# Patient Record
Sex: Male | Born: 1960 | Hispanic: No | Marital: Married | Smoking: Current every day smoker
Health system: Southern US, Community
[De-identification: ages and names within clinical notes are randomized; demographics above are authoritative.]

## PROBLEM LIST (undated history)

## (undated) DIAGNOSIS — M545 Low back pain, unspecified: Secondary | ICD-10-CM

## (undated) DIAGNOSIS — M549 Dorsalgia, unspecified: Secondary | ICD-10-CM

## (undated) DIAGNOSIS — M5136 Other intervertebral disc degeneration, lumbar region: Secondary | ICD-10-CM

## (undated) DIAGNOSIS — E785 Hyperlipidemia, unspecified: Secondary | ICD-10-CM

## (undated) DIAGNOSIS — M51369 Other intervertebral disc degeneration, lumbar region without mention of lumbar back pain or lower extremity pain: Secondary | ICD-10-CM

## (undated) DIAGNOSIS — M542 Cervicalgia: Secondary | ICD-10-CM

## (undated) DIAGNOSIS — G8929 Other chronic pain: Secondary | ICD-10-CM

## (undated) DIAGNOSIS — A6000 Herpesviral infection of urogenital system, unspecified: Secondary | ICD-10-CM

## (undated) HISTORY — PX: SHOULDER SURGERY: SHX246

## (undated) HISTORY — DX: Hyperlipidemia, unspecified: E78.5

---

## 2003-08-01 ENCOUNTER — Emergency Department (HOSPITAL_COMMUNITY): Admission: EM | Admit: 2003-08-01 | Discharge: 2003-08-02 | Payer: Self-pay

## 2003-08-03 ENCOUNTER — Emergency Department (HOSPITAL_COMMUNITY): Admission: EM | Admit: 2003-08-03 | Discharge: 2003-08-03 | Payer: Self-pay | Admitting: Emergency Medicine

## 2003-08-21 ENCOUNTER — Emergency Department (HOSPITAL_COMMUNITY): Admission: EM | Admit: 2003-08-21 | Discharge: 2003-08-21 | Payer: Self-pay | Admitting: Emergency Medicine

## 2004-01-31 ENCOUNTER — Ambulatory Visit (HOSPITAL_COMMUNITY): Admission: RE | Admit: 2004-01-31 | Discharge: 2004-02-01 | Payer: Self-pay | Admitting: Orthopedic Surgery

## 2004-03-12 ENCOUNTER — Encounter: Admission: RE | Admit: 2004-03-12 | Discharge: 2004-05-19 | Payer: Self-pay | Admitting: Orthopedic Surgery

## 2004-05-13 ENCOUNTER — Ambulatory Visit: Payer: Self-pay | Admitting: Family Medicine

## 2004-05-26 ENCOUNTER — Ambulatory Visit (HOSPITAL_COMMUNITY): Admission: RE | Admit: 2004-05-26 | Discharge: 2004-05-26 | Payer: Self-pay | Admitting: Orthopedic Surgery

## 2004-06-10 ENCOUNTER — Encounter: Admission: RE | Admit: 2004-06-10 | Discharge: 2004-06-26 | Payer: Self-pay | Admitting: Orthopedic Surgery

## 2004-06-24 ENCOUNTER — Ambulatory Visit: Payer: Self-pay | Admitting: Family Medicine

## 2004-08-12 ENCOUNTER — Ambulatory Visit: Payer: Self-pay | Admitting: Family Medicine

## 2004-08-21 ENCOUNTER — Encounter (INDEPENDENT_AMBULATORY_CARE_PROVIDER_SITE_OTHER): Payer: Self-pay | Admitting: Family Medicine

## 2004-08-21 LAB — CONVERTED CEMR LAB: Microalbumin U total vol: 1.46 mg/L

## 2004-11-03 ENCOUNTER — Ambulatory Visit: Payer: Self-pay | Admitting: *Deleted

## 2005-01-04 ENCOUNTER — Ambulatory Visit: Payer: Self-pay | Admitting: Family Medicine

## 2005-12-29 ENCOUNTER — Ambulatory Visit: Payer: Self-pay | Admitting: Family Medicine

## 2005-12-30 ENCOUNTER — Ambulatory Visit: Payer: Self-pay | Admitting: Family Medicine

## 2005-12-31 ENCOUNTER — Encounter (INDEPENDENT_AMBULATORY_CARE_PROVIDER_SITE_OTHER): Payer: Self-pay | Admitting: Family Medicine

## 2006-03-08 ENCOUNTER — Ambulatory Visit: Payer: Self-pay | Admitting: Family Medicine

## 2006-03-28 ENCOUNTER — Ambulatory Visit: Payer: Self-pay | Admitting: Family Medicine

## 2006-06-08 ENCOUNTER — Ambulatory Visit: Payer: Self-pay | Admitting: Family Medicine

## 2006-06-15 ENCOUNTER — Ambulatory Visit (HOSPITAL_COMMUNITY): Admission: RE | Admit: 2006-06-15 | Discharge: 2006-06-15 | Payer: Self-pay | Admitting: Family Medicine

## 2006-06-15 ENCOUNTER — Ambulatory Visit: Payer: Self-pay | Admitting: Family Medicine

## 2006-06-29 ENCOUNTER — Ambulatory Visit (HOSPITAL_COMMUNITY): Admission: RE | Admit: 2006-06-29 | Discharge: 2006-06-29 | Payer: Self-pay | Admitting: Internal Medicine

## 2006-06-29 ENCOUNTER — Ambulatory Visit: Payer: Self-pay | Admitting: Family Medicine

## 2006-07-22 ENCOUNTER — Ambulatory Visit: Payer: Self-pay | Admitting: Family Medicine

## 2006-10-14 ENCOUNTER — Ambulatory Visit: Payer: Self-pay | Admitting: Family Medicine

## 2007-02-06 ENCOUNTER — Encounter (INDEPENDENT_AMBULATORY_CARE_PROVIDER_SITE_OTHER): Payer: Self-pay | Admitting: Family Medicine

## 2007-02-06 DIAGNOSIS — E785 Hyperlipidemia, unspecified: Secondary | ICD-10-CM

## 2007-02-06 DIAGNOSIS — M25569 Pain in unspecified knee: Secondary | ICD-10-CM

## 2007-02-06 DIAGNOSIS — E119 Type 2 diabetes mellitus without complications: Secondary | ICD-10-CM | POA: Insufficient documentation

## 2007-02-08 DIAGNOSIS — M5137 Other intervertebral disc degeneration, lumbosacral region: Secondary | ICD-10-CM | POA: Insufficient documentation

## 2007-03-01 ENCOUNTER — Encounter (INDEPENDENT_AMBULATORY_CARE_PROVIDER_SITE_OTHER): Payer: Self-pay | Admitting: *Deleted

## 2007-09-15 ENCOUNTER — Ambulatory Visit: Payer: Self-pay | Admitting: Family Medicine

## 2007-09-15 LAB — CONVERTED CEMR LAB
BUN: 11 mg/dL (ref 6–23)
CO2: 22 meq/L (ref 19–32)
Chloride: 105 meq/L (ref 96–112)
Creatinine, Ser: 0.82 mg/dL (ref 0.40–1.50)
Glucose, Bld: 174 mg/dL — ABNORMAL HIGH (ref 70–99)
HCT: 46.7 % (ref 39.0–52.0)
Hemoglobin: 15.7 g/dL (ref 13.0–17.0)
LDL Cholesterol: 142 mg/dL — ABNORMAL HIGH (ref 0–99)
MCV: 87.1 fL (ref 78.0–100.0)
PSA: 0.73 ng/mL (ref 0.10–4.00)
Potassium: 4.2 meq/L (ref 3.5–5.3)
Triglycerides: 163 mg/dL — ABNORMAL HIGH (ref <150)
VLDL: 33 mg/dL (ref 0–40)
WBC: 6.6 10*3/uL (ref 4.0–10.5)

## 2007-09-22 ENCOUNTER — Ambulatory Visit (HOSPITAL_COMMUNITY): Admission: RE | Admit: 2007-09-22 | Discharge: 2007-09-22 | Payer: Self-pay | Admitting: Family Medicine

## 2008-01-11 ENCOUNTER — Ambulatory Visit: Payer: Self-pay | Admitting: Internal Medicine

## 2008-01-12 ENCOUNTER — Ambulatory Visit: Payer: Self-pay | Admitting: Internal Medicine

## 2008-07-19 ENCOUNTER — Ambulatory Visit: Payer: Self-pay | Admitting: Family Medicine

## 2008-09-11 ENCOUNTER — Ambulatory Visit: Payer: Self-pay | Admitting: Internal Medicine

## 2008-09-12 ENCOUNTER — Ambulatory Visit (HOSPITAL_COMMUNITY): Admission: RE | Admit: 2008-09-12 | Discharge: 2008-09-12 | Payer: Self-pay | Admitting: Internal Medicine

## 2008-11-06 ENCOUNTER — Ambulatory Visit: Payer: Self-pay | Admitting: Family Medicine

## 2009-01-30 ENCOUNTER — Ambulatory Visit: Payer: Self-pay | Admitting: Family Medicine

## 2009-02-01 ENCOUNTER — Ambulatory Visit (HOSPITAL_COMMUNITY): Admission: RE | Admit: 2009-02-01 | Discharge: 2009-02-01 | Payer: Self-pay | Admitting: Family Medicine

## 2009-04-01 ENCOUNTER — Ambulatory Visit: Payer: Self-pay | Admitting: Family Medicine

## 2009-06-05 ENCOUNTER — Ambulatory Visit: Payer: Self-pay | Admitting: Family Medicine

## 2009-07-14 ENCOUNTER — Ambulatory Visit: Payer: Self-pay | Admitting: Internal Medicine

## 2009-07-15 ENCOUNTER — Ambulatory Visit (HOSPITAL_COMMUNITY): Admission: RE | Admit: 2009-07-15 | Discharge: 2009-07-15 | Payer: Self-pay | Admitting: Family Medicine

## 2009-11-28 ENCOUNTER — Ambulatory Visit: Payer: Self-pay | Admitting: Family Medicine

## 2010-04-30 ENCOUNTER — Encounter (INDEPENDENT_AMBULATORY_CARE_PROVIDER_SITE_OTHER): Payer: Self-pay | Admitting: Family Medicine

## 2010-04-30 LAB — CONVERTED CEMR LAB: Microalb, Ur: 2.4 mg/dL — ABNORMAL HIGH (ref 0.00–1.89)

## 2010-08-17 ENCOUNTER — Encounter (INDEPENDENT_AMBULATORY_CARE_PROVIDER_SITE_OTHER): Payer: Self-pay | Admitting: Family Medicine

## 2010-08-17 LAB — CONVERTED CEMR LAB
AST: 21 units/L (ref 0–37)
Alkaline Phosphatase: 82 units/L (ref 39–117)
Glucose, Bld: 279 mg/dL — ABNORMAL HIGH (ref 70–99)
Potassium: 4.4 meq/L (ref 3.5–5.3)
Sodium: 138 meq/L (ref 135–145)
Total Bilirubin: 0.3 mg/dL (ref 0.3–1.2)
Total Protein: 7.1 g/dL (ref 6.0–8.3)
Triglycerides: 587 mg/dL — ABNORMAL HIGH (ref <150)

## 2010-10-30 NOTE — Op Note (Signed)
Christian Navarro, Christian Navarro                 ACCOUNT NO.:  [id number]   MEDICAL RECORD NO.:  [id number]          PATIENT TYPE:  AMB   LOCATION:  DAY                          FACILITY:  [facility]   PHYSICIAN:  Almedia Balls. Ranell Patrick, M.D. DATE OF BIRTH:  1961-01-12   DATE OF PROCEDURE:  05/26/2004  DATE OF DISCHARGE:                                 OPERATIVE REPORT   PREOPERATIVE DIAGNOSES:  Retained coracoclavicular screw right shoulder  status post Lamont Snowball procedure.   POSTOPERATIVE DIAGNOSES:  Retained coracoclavicular screw right shoulder  status post Lamont Snowball procedure.   PROCEDURE:  Removal of coracoclavicular screw.   SURGEON:  Almedia Balls. Ranell Patrick, M.D.   ASSISTANT:  None.   ANESTHESIA:  General anesthesia was used.   ESTIMATED BLOOD LOSS:  Minimal.   FLUIDS REPLACED:  400 mL crystalloid.   INSTRUMENT COUNT:  Correct.   COMPLICATIONS:  None.   ANTIBIOTICS:  Perioperative antibiotics were given.   INDICATIONS FOR PROCEDURE:  The patient is a 50 year old male status post  Weaver Dunn reconstruction of the right shoulder. The patient presents now  for staged removal of right shoulder CC screw.  Informed consent was  obtained.   DESCRIPTION OF PROCEDURE:  After an adequate level of anesthesia was  achieved, the patient was positioned supine on the operating room table, his  shoulder was bumped up off the operating room table, he was slid to the side  of the table for C-arm is necessary.  After sterile prep and drape of the  right shoulder, the patient's prior skin incision was utilized, dissection  using a 15 blade scalpel, dissection carried sharply down through the  subcutaneous tissues  using the Bovie. The screw was easily identified and removed under direct  visualization with an accompanying washer, thorough irrigation followed by  layered closure with Vicryl in subcuticular Monocryl.  Steri-Strips applied  followed by a sterile dressing. The patient taken to the recovery  room in  stable condition.      SRN/MEDQ  D:  05/26/2004  T:  05/26/2004  Job:  [id number]
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
## 2011-07-08 ENCOUNTER — Ambulatory Visit: Payer: Self-pay

## 2012-01-13 ENCOUNTER — Emergency Department (HOSPITAL_COMMUNITY)
Admission: EM | Admit: 2012-01-13 | Discharge: 2012-01-14 | Discharge status: Against Medical Advice | Payer: Self-pay | Attending: Emergency Medicine | Admitting: Emergency Medicine

## 2012-01-13 ENCOUNTER — Encounter (HOSPITAL_COMMUNITY): Payer: Self-pay | Admitting: Family Medicine

## 2012-01-13 DIAGNOSIS — F17200 Nicotine dependence, unspecified, uncomplicated: Secondary | ICD-10-CM | POA: Insufficient documentation

## 2012-01-13 DIAGNOSIS — E119 Type 2 diabetes mellitus without complications: Secondary | ICD-10-CM | POA: Insufficient documentation

## 2012-01-13 DIAGNOSIS — M549 Dorsalgia, unspecified: Secondary | ICD-10-CM | POA: Insufficient documentation

## 2012-01-13 LAB — GLUCOSE, CAPILLARY: Glucose-Capillary: 313 mg/dL — ABNORMAL HIGH (ref 70–99)
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
## 2012-01-14 NOTE — ED Provider Notes (Signed)
Medical screening examination/treatment/procedure(s) were performed by non-physician practitioner and as supervising physician I was immediately available for consultation/collaboration.  Clotee Schlicker K Victorhugo Preis-Rasch, MD 01/14/12 0413 

## 2012-01-14 NOTE — ED Provider Notes (Signed)
History     CSN: [id number]  Arrival date & time 01/13/12  2152   First MD Initiated Contact with Patient 01/13/12 2340      Chief Complaint  Patient presents with  . Back Pain    (Consider location/radiation/quality/duration/timing/severity/associated sxs/prior treatment) HPI  Past Medical History  Diagnosis Date  . Diabetes mellitus     Past Surgical History  Procedure Date  . Shoulder surgery     No family history on file.  History  Substance Use Topics  . Smoking status: Current Everyday Smoker -- 1.0 packs/day    Types: Cigarettes  . Smokeless tobacco: Not on file  . Alcohol Use: No      Review of Systems  Allergies  Review of patient's allergies indicates no known allergies.  Home Medications  No current outpatient prescriptions on file.  BP 129/66  Pulse 86  Temp 98.2 F (36.8 C) (Oral)  Resp 22  Ht 5' (1.524 m)  Wt 170 lb (77.111 kg)  BMI 33.20 kg/m2  SpO2 100%  Physical Exam  ED Course  Procedures (including critical care time)  Labs Reviewed  GLUCOSE, CAPILLARY - Abnormal; Notable for the following:    Glucose-Capillary 313 (*)     All other components within normal limits   No results found.  12:02 AM Pt not currently in his room.   No diagnosis found.    MDM  Pt left AMA after triage prior to being seen by me.          [place], [place] 01/14/12 [phone]

## 2012-10-03 ENCOUNTER — Emergency Department (HOSPITAL_COMMUNITY): Admission: EM | Admit: 2012-10-03 | Discharge: 2012-10-03 | Discharge status: Against Medical Advice | Payer: Self-pay

## 2012-10-12 ENCOUNTER — Encounter (HOSPITAL_COMMUNITY): Payer: Self-pay

## 2012-10-12 ENCOUNTER — Emergency Department (INDEPENDENT_AMBULATORY_CARE_PROVIDER_SITE_OTHER)
Admission: EM | Admit: 2012-10-12 | Discharge: 2012-10-12 | Disposition: A | Discharge status: Home Health | Payer: Self-pay | Source: Home / Self Care

## 2012-10-12 DIAGNOSIS — E785 Hyperlipidemia, unspecified: Secondary | ICD-10-CM

## 2012-10-12 DIAGNOSIS — E119 Type 2 diabetes mellitus without complications: Secondary | ICD-10-CM

## 2012-10-12 MED ORDER — GLUCOSE BLOOD VI STRP: ORAL_STRIP | Status: DC

## 2012-10-12 MED ORDER — METFORMIN HCL 1000 MG PO TABS: 1000.0000 mg | ORAL_TABLET | Freq: Two times a day (BID) | ORAL | Status: DC

## 2012-10-12 MED ORDER — INSULIN GLARGINE 100 UNIT/ML ~~LOC~~ SOLN: 30.0000 [IU] | Freq: Every day | SUBCUTANEOUS | Status: DC

## 2012-10-12 MED ORDER — GLIMEPIRIDE 4 MG PO TABS: 4.0000 mg | ORAL_TABLET | Freq: Every day | ORAL | Status: DC

## 2012-10-12 NOTE — Progress Notes (Signed)
Patient Demographics  Christian Navarro, is a 52 y.o. male  ZOX:[id number]  WJX:[id number]  DOB - 16-Dec-1960  Chief Complaint  Patient presents with  . Diabetes        Subjective:   Christian Navarro today is here to establish primary care. Patient has run out of Lantus insulin, Amaryl and statins. He claims that for the past 1-1 she has had heaviness in his right with associated hearing loss. He is mainly here to establish primary care subjective refill on his insulin and his other medications.  Patient has No headache, No chest pain, No abdominal pain - No Nausea, No new weakness tingling or numbness, No Cough - SOB.   Objective:    Filed Vitals:   10/12/12 1655  BP: 129/66  Pulse: 91  Temp: 98 F (36.7 C)  TempSrc: Oral  Resp: 16  SpO2: 98%     ALLERGIES:  No Known Allergies  PAST MEDICAL HISTORY: Past Medical History  Diagnosis Date  . Diabetes mellitus     PAST SURGICAL HISTORY: Past Surgical History  Procedure Laterality Date  . Shoulder surgery      FAMILY HISTORY: Mother had coronary artery disease  SOCIAL HISTORY - Tobacco use  MEDICATIONS AT HOME: Prior to Admission medications   Medication Sig Start Date End Date Taking? Authorizing Provider  glimepiride (AMARYL) 4 MG tablet Take 1 tablet (4 mg total) by mouth daily before breakfast. 10/12/12   Maretta Bees, MD  glucose blood (ACCU-CHEK AVIVA) test strip Use as instructed 10/12/12   Maretta Bees, MD  insulin glargine (LANTUS) 100 UNIT/ML injection Inject 0.3 mLs (30 Units total) into the skin at bedtime. 10/12/12   Shanker Levora Dredge, MD  metFORMIN (GLUCOPHAGE) 1000 MG tablet Take 1 tablet (1,000 mg total) by mouth 2 (two) times daily with a meal. 10/12/12   Shanker Levora Dredge, MD    REVIEW OF SYSTEMS:  Constitutional:   No   Fevers, chills, fatigue.  HEENT:    No headaches, Sore throat,   Cardio-vascular: No chest pain,  Orthopnea, swelling in lower extremities, anasarca, palpitations  GI:   No abdominal pain, nausea, vomiting, diarrhea  Resp: No shortness of breath,  No coughing up of blood.No cough.No wheezing.  Skin:  no rash or lesions.  GU:  no dysuria, change in color of urine, no urgency or frequency.  No flank pain.  Musculoskeletal: No joint pain or swelling.  No decreased range of motion.  No back pain.  Psych: No change in mood or affect. No depression or anxiety.  No memory loss.   Exam  General appearance :Awake, alert, not in any distress. Speech Clear. Not toxic Looking HEENT: Atraumatic and Normocephalic, pupils equally reactive to light and accomodation. Right external auditory canal looks clean, tympanic membrane visible without any major defects. Neck: supple, no JVD. No cervical lymphadenopathy.  Chest:Good air entry bilaterally, no added sounds  CVS: S1 S2 regular, no murmurs.  Abdomen: Bowel sounds present, Non tender and not distended with no gaurding, rigidity or rebound. Extremities: B/L Lower Ext shows no edema, both legs are warm to touch Neurology: Awake alert, and oriented X 3, CN II-XII intact, Non focal Skin:No Rash Wounds:N/A    Data Review   CBC No results found for this basename: WBC, HGB, HCT, PLT, MCV, MCH, MCHC, RDW, NEUTRABS, LYMPHSABS, MONOABS, EOSABS, BASOSABS, BANDABS, BANDSABD,  in the last 168 hours  Chemistries   No results found for this basename: NA, K, CL, CO2, GLUCOSE, BUN,  CREATININE, GFRCGP, CALCIUM, MG, AST, ALT, ALKPHOS, BILITOT,  in the last 168 hours ------------------------------------------------------------------------------------------------------------------ No results found for this basename: HGBA1C,  in the last 72 hours ------------------------------------------------------------------------------------------------------------------ No results found for this basename: CHOL, HDL, LDLCALC, TRIG, CHOLHDL, LDLDIRECT,  in the last 72  hours ------------------------------------------------------------------------------------------------------------------ No results found for this basename: TSH, T4TOTAL, FREET3, T3FREE, THYROIDAB,  in the last 72 hours ------------------------------------------------------------------------------------------------------------------ No results found for this basename: VITAMINB12, FOLATE, FERRITIN, TIBC, IRON, RETICCTPCT,  in the last 72 hours  Coagulation profile  No results found for this basename: INR, PROTIME,  in the last 168 hours    Assessment & Plan   Diabetes mellitus - Resume Lantus, Amaryl and metformin - Bring back in 2 weeks for weeks CBG check - Asked patient to record CBG readings in a diary and bring with him next visit  Dyslipidemia - Check lipid panel-restart statins as needed next visit  Right Ear hearing loss - Refer to ENT-RN will make referral  Routine labs ordered to bedone prior to next visit-please check at next visit   Follow-up Information   Follow up with [facility]. Schedule an appointment as soon as possible for a visit in 2 weeks.

## 2012-10-12 NOTE — ED Notes (Signed)
Patient has a history of DM Also complains of fluid to right ear

## 2012-12-22 ENCOUNTER — Ambulatory Visit (INDEPENDENT_AMBULATORY_CARE_PROVIDER_SITE_OTHER): Payer: BC Managed Care – PPO | Admitting: Endocrinology

## 2012-12-22 ENCOUNTER — Encounter: Payer: Self-pay | Admitting: Endocrinology

## 2012-12-22 ENCOUNTER — Other Ambulatory Visit: Payer: Self-pay | Admitting: *Deleted

## 2012-12-22 VITALS — BP 126/76 | HR 97 | Temp 97.9°F | Ht 67.0 in | Wt 169.6 lb

## 2012-12-22 DIAGNOSIS — E119 Type 2 diabetes mellitus without complications: Secondary | ICD-10-CM | POA: Insufficient documentation

## 2012-12-22 DIAGNOSIS — E785 Hyperlipidemia, unspecified: Secondary | ICD-10-CM

## 2012-12-22 DIAGNOSIS — E11319 Type 2 diabetes mellitus with unspecified diabetic retinopathy without macular edema: Secondary | ICD-10-CM | POA: Insufficient documentation

## 2012-12-22 MED ORDER — INSULIN LISPRO 100 UNIT/ML ~~LOC~~ SOLN: 6.0000 [IU] | Freq: Three times a day (TID) | SUBCUTANEOUS | Status: DC

## 2012-12-22 NOTE — Patient Instructions (Signed)
Please check blood sugars at least half the time about 2 hours after either breakfast, lunch or dinner and at least every other day on waking up. Please bring blood sugar records to each visit LANTUS insulin: Take 28 units at night daily HUMALOG insulin start taking 6 units just before every complete meal. If the blood sugar 2 hours after eating is still over 200 may go up to 8 units for that meal Start walking at least 20 minutes DAILY Cut back on portions of fruit Stop GLIMEPIRIDE and continue metformin  May use OTC Lotrimin AF for yeast infection between the toes

## 2012-12-22 NOTE — Progress Notes (Signed)
Patient ID: CLEARNCE LEJA, male   DOB: July 14, 1960, 52 y.o.   MRN: [id number]  ESSA MALACHI is an 52 y.o. male.   Reason for Appointment : Consultation for Type 2 Diabetes  History of Present Illness:         Diagnosis: Type 2 diabetes mellitus, date of diagnosis: 1998 with glucose 578        The diet that the patient has been following is: No particular diet      Monitors blood glucose: Twice a day.         Glucometer:  generic   Blood Glucose readings by recall: Before breakfast: 80-150        hs 200-400  Hypoglycemia frequency:  occasionally overnight with readings in the 50s         Meals: 2 meals per day 11 am, 11 pm  Does not watch diet. At times daily more fruits, rarely sweets, no fast food, likes bread, usually not eating fried food        Physical activity: exercise: Socccer 1/7.          Dietician visit: Most recent: never         Retinal exam: Most recent: 2 years ago.           Complications:  mild erectile dysfunction         INSULIN regimen is described as Lantus 30 units hs On insulin 3 years and apparently his blood sugars were quite high before going to insulin and was losing weight. However no detailed records are available. He has been continued on Amaryl and metformin which presumably were started early in the course of her diabetes. He has never been on mealtime insulin  Oral hypoglycemic drugs the patient is taking are: Metformin and Amaryl Side effects from medications have been: None Compliance with the medical regimen has been  fairly good except for diet The microalbumin has  probably not been tested, no records available  Last A1c is 11% on 10/25/12   Medication List       This list is accurate as of: 12/22/12  1:22 PM.  Always use your most recent med list.               clomiPHENE 50 MG tablet  Commonly known as:  CLOMID  Take 50 mg by mouth daily.     glimepiride 4 MG tablet  Commonly known as:  AMARYL  Take 1 tablet (4 mg total) by mouth  daily before breakfast.     glucose blood test strip  Commonly known as:  ACCU-CHEK AVIVA  Use as instructed     insulin glargine 100 UNIT/ML injection  Commonly known as:  LANTUS  Inject 0.3 mLs (30 Units total) into the skin at bedtime.     metFORMIN 1000 MG tablet  Commonly known as:  GLUCOPHAGE  Take 1 tablet (1,000 mg total) by mouth 2 (two) times daily with a meal.     traMADol 50 MG tablet  Commonly known as:  ULTRAM  Take 50 mg by mouth every 6 (six) hours as needed for pain.        Allergies: No Known Allergies  Past Medical History  Diagnosis Date  . Diabetes mellitus     Past Surgical History  Procedure Laterality Date  . Shoulder surgery      No family history on file.  Social History:  reports that he has been smoking Cigarettes.  He has been smoking about  1.00 pack per day. He does not have any smokeless tobacco history on file. He reports that he does not drink alcohol or use illicit drugs.     Review of Systems  Constitutional: Negative for malaise/fatigue.  Eyes: Negative for blurred vision.  Respiratory: Negative for shortness of breath.   Cardiovascular:       No history of hypertension  Gastrointestinal: Negative for diarrhea.  Genitourinary:       He thinks he has mild difficulty with erections but normal libido. 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  Neurological: Negative for dizziness, tingling and headaches.  Endo/Heme/Allergies:       He thinks he has had hypercholesterolemia but only records available show increased triglycerides and low HDL, was on unknown medication a few months ago   he does not have frequency of urination during the day but goes about 2 times at night.   Physical Examination:  BP 126/76  Pulse 97  Temp(Src) 97.9 F (36.6 C)  Ht 5\' 7"  (1.702 m)  Wt 169 lb 9.6 oz (76.93 kg)  BMI 26.56 kg/m2   SpO2 98%   GENERAL:         Patient appears to have abdominal obesity.   HEENT:         Eye exam shows normal external appearance. Fundus exam shows no retinopathy. Oral exam shows normal mucosa .  NECK:         General:  Neck exam shows no lymphadenopathy. Carotids are normal to palpation and no bruit heard. Thyroid is not enlarged and no nodules felt.   LUNGS:         Chest is symmetrical. Lungs are clear to auscultation.Marland Kitchen   HEART:         Heart sounds:  S1 and S2 are normal. No murmurs or clicks heard., no S3 or S4.   ABDOMEN:         General:  There is no distention present. Liver and spleen are not palpable. No other mass or tenderness present.  EXTREMITIES:     There is no edema. He has intertrigo on the right foot between the fourth and fifth toes with whitish discoloration  NEUROLOGICAL:        Vibration sense is mildly reduced  in toes. Ankle jerks are absent bilaterally.          MUSCULOSKELETAL:       There is no enlargement or deformity of the joints. Spine is normal to inspection.Marland Kitchen   PEDAL pulses: Normal SKIN:       No rash or lesions of concern.        ASSESSMENT:  Diabetes type 2, uncontrolled - 250.02    The patient has poorly controlled diabetes and not clear how long he has had poor control. Generally he has had long-standing diabetes insulin deficiency now. He does have some abdominal obesity but is not significantly obese and with an A1c of 11% or more he does need to be on basal bolus insulin regimen. Currently he does not understand the need for mealtime coverage and is currently other currently adjusting his Lantus at night when his blood sugars are high. This may occasionally cause hypoglycemia also Discussed with him in detail how mealtime insulin works and will start him empirically with 6 units before each main meal. Discussed briefly how to adjust the dose based on  postprandial readings to keep him at least under 200 Also reminded him to exercise regularly which he  is not doing. He does need to have significant amount of diabetes education since he has little knowledge and also needs to get meal planning advice along with his wife for balanced meals. Discussed avoiding large amounts of fruit especially for snacks. Given him a prescription for Humalog insulin and a brochure and discussed frequency and timing of glucose monitoring. He'll keep a record of his blood sugar and bring for review on the next visit. He does need to be on a brand name glucose monitor but apparently this could not be covered by his insurance   2. HYPERTRIGLYCERIDEMIA: His triglycerides were 587 about 2 years ago and HDL 28, will need repeat testing on the next visit   Akiera Allbaugh 12/22/2012, 1:22 PM

## 2013-01-05 ENCOUNTER — Other Ambulatory Visit: Payer: Self-pay | Admitting: *Deleted

## 2013-01-05 MED ORDER — INSULIN LISPRO 100 UNIT/ML ~~LOC~~ SOLN: 6.0000 [IU] | Freq: Three times a day (TID) | SUBCUTANEOUS | Status: DC

## 2013-01-22 ENCOUNTER — Ambulatory Visit: Payer: BC Managed Care – PPO | Admitting: Endocrinology

## 2013-01-22 DIAGNOSIS — Z0289 Encounter for other administrative examinations: Secondary | ICD-10-CM

## 2013-01-30 ENCOUNTER — Ambulatory Visit: Payer: BC Managed Care – PPO | Admitting: *Deleted

## 2013-02-09 ENCOUNTER — Other Ambulatory Visit: Payer: Self-pay | Admitting: Internal Medicine

## 2013-02-09 DIAGNOSIS — E119 Type 2 diabetes mellitus without complications: Secondary | ICD-10-CM

## 2013-02-28 ENCOUNTER — Ambulatory Visit: Payer: BC Managed Care – PPO | Admitting: *Deleted

## 2013-03-08 ENCOUNTER — Telehealth: Payer: Self-pay | Admitting: *Deleted

## 2013-03-08 NOTE — Telephone Encounter (Signed)
[place] called from patients PCP office, wanted to let you know that his A1C is 11.7, she said she doesn't think he's been taking his humalog because he didn't say anything to her about it on his last OV, he's no showed for you on last visit and she said he no showed for her on his last OV there.

## 2013-03-08 NOTE — Telephone Encounter (Signed)
Please see if he wants to reschedule appt

## 2013-03-08 NOTE — Telephone Encounter (Signed)
LM for pt to call and r/s appt / Sherri S.

## 2013-03-26 ENCOUNTER — Other Ambulatory Visit: Payer: Self-pay | Admitting: *Deleted

## 2013-03-26 ENCOUNTER — Ambulatory Visit (INDEPENDENT_AMBULATORY_CARE_PROVIDER_SITE_OTHER): Payer: BC Managed Care – PPO | Admitting: Endocrinology

## 2013-03-26 ENCOUNTER — Encounter: Payer: Self-pay | Admitting: Endocrinology

## 2013-03-26 VITALS — BP 104/68 | HR 89 | Temp 98.4°F | Resp 12 | Ht 65.0 in | Wt 174.2 lb

## 2013-03-26 DIAGNOSIS — IMO0001 Reserved for inherently not codable concepts without codable children: Secondary | ICD-10-CM

## 2013-03-26 DIAGNOSIS — E785 Hyperlipidemia, unspecified: Secondary | ICD-10-CM

## 2013-03-26 MED ORDER — INSULIN LISPRO 100 UNIT/ML ~~LOC~~ SOLN: SUBCUTANEOUS | Status: DC

## 2013-03-26 MED ORDER — ONETOUCH DELICA LANCETS FINE MISC: 1.0000 | Freq: Two times a day (BID) | Status: DC

## 2013-03-26 MED ORDER — INSULIN LISPRO 100 UNIT/ML ~~LOC~~ SOLN: 6.0000 [IU] | Freq: Three times a day (TID) | SUBCUTANEOUS | Status: DC

## 2013-03-26 MED ORDER — GLUCOSE BLOOD VI STRP: ORAL_STRIP | Status: DC

## 2013-03-26 NOTE — Progress Notes (Signed)
Patient ID: Christian Navarro, male   DOB: Nov 19, 1960, 52 y.o.   MRN: [id number]  Christian Navarro is an 52 y.o. male.   Reason for Appointment : Followup for Type 2 Diabetes  History of Present Illness:         Diagnosis: Type 2 diabetes mellitus, date of diagnosis: 1998 with glucose 578  Past history:    he has been on insulin about 3 years and  his blood sugars were quite high before going to insulin and was losing weight. However no detailed records are available. He has been continued on Amaryl and metformin which presumably were started early in the course of his diabetes. He had not been on mealtime insulin and only Lantus  RECENT history:  Because of his persistently poor control he was advised to start Humalog with each meal to help with mealtime control However because of the cost he has not started this He did not followup in August as scheduled Also was told not to adjust his Lantus based on the reading at night Still has not checked his blood sugar regularly and unable to get a blood sugar patterns today since he does not keep a record and is only using his genetic monitor    INSULIN regimen is described as Lantus 30 units hs The diet that the patient has been following is: No particular diet      Monitors blood glucose:  once or twice a day.         Glucometer:  generic   Blood Glucose readings  [phone]  Hypoglycemia frequency:  rarely overnight      Meals: 2 meals per day 11 am, 11 pm   At times he will have more fruits, rarely sweets, no fast food, likes bread, usually not eating fried food        Physical activity: exercise: Socccer 1/7.          Dietician visit: Most recent: never         Retinal exam: Most recent: 2 years ago.           Complications:  mild erectile dysfunction    Oral hypoglycemic drugs the patient is taking are: Metformin and Amaryl Side effects from medications have been: None Compliance with the medical regimen has been  only fair The  microalbumin has  probably not been tested, no records available  Last A1c is 11% on 10/25/12  No results found for this basename: HGBA1C   Lab Results  Component Value Date   MICROALBUR 2.40* 04/30/2010   LDLCALC See Comment mg/dL 11/16/7844   CREATININE 9.62 08/17/2010       Medication List       This list is accurate as of: 03/26/13 11:59 PM.  Always use your most recent med list.               clomiPHENE 50 MG tablet  Commonly known as:  CLOMID  Take 50 mg by mouth daily.     glimepiride 4 MG tablet  Commonly known as:  AMARYL  TAKE ONE TABLET BY MOUTH ONCE DAILY BEFORE  BREAKFAST     glucose blood test strip  Commonly known as:  ONETOUCH VERIO  Use as instructed to check blood sugars 2 times a day     insulin glargine 100 UNIT/ML injection  Commonly known as:  LANTUS  Inject 0.3 mLs (30 Units total) into the skin at bedtime.     insulin lispro  100 UNIT/ML injection  Commonly known as:  HUMALOG  Take 5 units before breakfast and lunch and 10 before dinner     metFORMIN 1000 MG tablet  Commonly known as:  GLUCOPHAGE  Take 1 tablet (1,000 mg total) by mouth 2 (two) times daily with a meal.     ONETOUCH DELICA LANCETS FINE Misc  1 each by Does not apply route 2 (two) times daily.     traMADol 50 MG tablet  Commonly known as:  ULTRAM  Take 50 mg by mouth every 6 (six) hours as needed for pain.        Allergies: No Known Allergies  Past Medical History  Diagnosis Date  . Diabetes mellitus     Past Surgical History  Procedure Laterality Date  . Shoulder surgery      No family history on file.  Social History:  reports that he has been smoking Cigarettes.  He has been smoking about 1.00 pack per day. He does not have any smokeless tobacco history on file. He reports that he does not drink alcohol or use illicit drugs.     ROS   No history of hypertension No history of tingling or numbness in his feet   Physical Examination:  BP 104/68   Pulse 89  Temp(Src) 98.4 F (36.9 C)  Resp 12  Ht 5\' 5"  (1.651 m)  Wt 174 lb 3.2 oz (79.017 kg)  BMI 28.99 kg/m2  SpO2 98%        He has intertrigo between his right fourth and fifth toes  ASSESSMENT:  1. Diabetes type 2, uncontrolled - 250.02    The patient has poorly controlled diabetes with insulin deficiency He has not followed instructions for taking mealtime insulin and discussed importance of doing this. He will get the prescription from the drug store since he does have insurance coverage Also discussed needing to take the Humalog with every meal and adjusting it based on meal size, will try 5 units before breakfast and lunch and 10 units before dinner for the usual meals and larger doses for her larger meals He will start using the brand name glucose monitor and get the strips from the drug store, discussed when to check the blood sugar as well as blood sugar targets LANTUS insulin: Since he has occasional low sugars overnight and probably will have better bedtime readings with Humalog at supper he was reduce the dose to 26 units Encourage regular walking for exercise Discussed potential for hypoglycemia with mealtime insulin and treatment for this He will be scheduled for diabetes education and meal planning instructions  2. HYPERTRIGLYCERIDEMIA: His triglycerides were 587 about 2 years ago and HDL 28, will need repeat testing on the next visit   3. He has intertrigo between his right fourth and fifth toes and advised him to use Lamisil or Lotrimin OTC twice a day for 2 weeks  Whitley Patchen 03/30/2013, 12:54 PM

## 2013-03-26 NOTE — Patient Instructions (Addendum)
Humalog 10 units before dinner and 5 units before breakfast and lunch   Lantus 26 units at night  Please check blood sugars at least half the time about 2 hours after any meal and as directed on waking up. SUGAR AFTER MEALS SHOULD BE 140-180 RANGE  Please bring blood sugar monitor to each visit  Toe fungus Lamisil or Lotrimin

## 2013-03-30 ENCOUNTER — Other Ambulatory Visit: Payer: Self-pay | Admitting: Internal Medicine

## 2013-04-03 ENCOUNTER — Telehealth: Payer: Self-pay | Admitting: *Deleted

## 2013-04-03 NOTE — Telephone Encounter (Signed)
5 units for small meals and 10 units for large meals

## 2013-04-03 NOTE — Telephone Encounter (Signed)
Crystal from [facility] needs clarification on patients humalog dosing instructions. Please advise

## 2013-04-09 ENCOUNTER — Other Ambulatory Visit: Payer: Self-pay | Admitting: Internal Medicine

## 2013-04-10 ENCOUNTER — Ambulatory Visit: Payer: BC Managed Care – PPO | Admitting: *Deleted

## 2013-04-12 ENCOUNTER — Telehealth: Payer: Self-pay | Admitting: *Deleted

## 2013-04-12 NOTE — Telephone Encounter (Signed)
Needs to continue only metformin

## 2013-04-12 NOTE — Telephone Encounter (Signed)
Noted, crystal is aware and she will let the patient know

## 2013-04-12 NOTE — Telephone Encounter (Signed)
Crystal from the [facility] says the patient asked her about whether he should be taking any oral medications since he's on the insulin now?  Please advise

## 2013-04-19 ENCOUNTER — Other Ambulatory Visit: Payer: BC Managed Care – PPO

## 2013-04-19 ENCOUNTER — Telehealth: Payer: Self-pay | Admitting: Endocrinology

## 2013-04-19 NOTE — Telephone Encounter (Signed)
Pt no showed today's lab appt, has appt for follow up w/ Dr. Lucianne Muss on 04/23/13 / Roanna Raider

## 2013-04-20 ENCOUNTER — Other Ambulatory Visit: Payer: BC Managed Care – PPO

## 2013-04-23 ENCOUNTER — Ambulatory Visit: Payer: BC Managed Care – PPO | Admitting: Endocrinology

## 2013-04-23 DIAGNOSIS — Z0289 Encounter for other administrative examinations: Secondary | ICD-10-CM

## 2013-04-24 ENCOUNTER — Encounter: Payer: Self-pay | Admitting: *Deleted

## 2013-05-23 ENCOUNTER — Ambulatory Visit: Payer: BC Managed Care – PPO | Admitting: Endocrinology

## 2013-05-25 ENCOUNTER — Other Ambulatory Visit: Payer: Self-pay | Admitting: *Deleted

## 2013-05-25 ENCOUNTER — Encounter: Payer: Self-pay | Admitting: Endocrinology

## 2013-05-25 ENCOUNTER — Ambulatory Visit (INDEPENDENT_AMBULATORY_CARE_PROVIDER_SITE_OTHER): Payer: BC Managed Care – PPO | Admitting: Endocrinology

## 2013-05-25 VITALS — BP 112/60 | HR 92 | Temp 98.3°F | Resp 12 | Ht 65.0 in | Wt 173.3 lb

## 2013-05-25 DIAGNOSIS — IMO0001 Reserved for inherently not codable concepts without codable children: Secondary | ICD-10-CM

## 2013-05-25 DIAGNOSIS — E785 Hyperlipidemia, unspecified: Secondary | ICD-10-CM

## 2013-05-25 LAB — COMPREHENSIVE METABOLIC PANEL
ALT: 26 U/L (ref 0–53)
AST: 22 U/L (ref 0–37)
Albumin: 3.9 g/dL (ref 3.5–5.2)
BUN: 15 mg/dL (ref 6–23)
Calcium: 9.1 mg/dL (ref 8.4–10.5)
Chloride: 107 mEq/L (ref 96–112)
Potassium: 4.4 mEq/L (ref 3.5–5.1)

## 2013-05-25 LAB — URINALYSIS, ROUTINE W REFLEX MICROSCOPIC
Ketones, ur: NEGATIVE
RBC / HPF: NONE SEEN (ref 0–?)
Specific Gravity, Urine: >=1.03 (ref 1.000–1.030)
Urine Glucose: NEGATIVE
pH: 5.5 (ref 5.0–8.0)

## 2013-05-25 LAB — LIPID PANEL
Cholesterol: 195 mg/dL (ref 0–200)
Total CHOL/HDL Ratio: 6

## 2013-05-25 MED ORDER — ONETOUCH DELICA LANCETS FINE MISC: 1.0000 | Freq: Two times a day (BID) | Status: DC

## 2013-05-25 MED ORDER — GLUCOSE BLOOD VI STRP: ORAL_STRIP | Status: DC

## 2013-05-25 NOTE — Patient Instructions (Signed)
TAKE 30 LANTUS IN AM  HUMALOG for more Carbs 8 units in am  Lunch 10 units Humalog   Dinner 12 Humalog and more if higher

## 2013-05-25 NOTE — Progress Notes (Signed)
Patient ID: Christian Navarro, male   DOB: Oct 24, 1960, 52 y.o.   MRN: [id number]  Reason for Appointment : Followup for Type 2 Diabetes  History of Present Illness:         Diagnosis: Type 2 diabetes mellitus, date of diagnosis: 1998 with glucose 578  Past history:    he has been on insulin about 3 years and his blood sugars were quite high before going to insulin and was losing weight. However no detailed records are available. He has been continued on Amaryl and metformin which presumably were started early in the course of his diabetes. He had not been on mealtime insulin and only Lantus  RECENT history:  Because of his persistently poor control with Lantus alone he was started on Humalog with each meal to help with mealtime control However he still appears to have significantly high readings especially later in the day Now taking Lantus in the morning to avoid overnight hypoglycemia and since he was adjusting the dose based on bedtime reading He is again irregular with his followup and also is not using a brand-new meter. Difficult to analyze his home readings as his monitor does not have the proper time programmed on it and he does not keep a record    INSULIN regimen is described as Lantus 26 units am; Humalog 5 acb 10 acs Oral hypoglycemic drugs the patient is taking are: Metformin and Amaryl The diet that the patient has been following is: No particular diet      Monitors blood glucose:  once or twice a day.         Glucometer:  generic Fasting readings: 89, 221 Suppertime: 410 208, usually no readings after meals   Hypoglycemia frequency:  none recently    Meals: 2-3 meals per day 11 am, 11 pm   At times he will have more fruits, rarely sweets, no fast food, likes bread, usually low fat         Physical activity: exercise: Socccer 1/7.          Dietician visit: Most recent: never         Retinal exam: Most recent: 2 years ago.           Complications:  mild erectile dysfunction    Side effects from medications have been: None Compliance with the medical regimen has been  only fair  Last A1c is 11% on 10/25/12  Lab Results  Component Value Date   HGBA1C 11.6* 05/25/2013   Lab Results  Component Value Date   MICROALBUR 0.5 05/25/2013   LDLCALC See Comment mg/dL 09/13/3242   CREATININE 0.9 05/25/2013       Medication List       This list is accurate as of: 05/25/13 11:59 PM.  Always use your most recent med list.               clomiPHENE 50 MG tablet  Commonly known as:  CLOMID  Take 50 mg by mouth daily.     glimepiride 4 MG tablet  Commonly known as:  AMARYL  TAKE ONE TABLET BY MOUTH ONCE DAILY BEFORE  BREAKFAST     glucose blood test strip  Commonly known as:  ONETOUCH VERIO  Use as instructed to check blood sugars 2 times a day     insulin glargine 100 UNIT/ML injection  Commonly known as:  LANTUS  Inject 26 Units into the skin at bedtime.     insulin lispro 100 UNIT/ML injection  Commonly known as:  HUMALOG  Take 5 units before breakfast and lunch and 10 before dinner     metFORMIN 1000 MG tablet  Commonly known as:  GLUCOPHAGE  Take 1 tablet (1,000 mg total) by mouth 2 (two) times daily with a meal.     ONETOUCH DELICA LANCETS FINE Misc  1 each by Does not apply route 2 (two) times daily.     traMADol 50 MG tablet  Commonly known as:  ULTRAM  Take 50 mg by mouth every 6 (six) hours as needed for pain.        Allergies: No Known Allergies  Past Medical History  Diagnosis Date  . Diabetes mellitus     Past Surgical History  Procedure Laterality Date  . Shoulder surgery      No family history on file.  Social History:  reports that he has been smoking Cigarettes.  He has been smoking about 1.00 pack per day. He does not have any smokeless tobacco history on file. He reports that he does not drink alcohol or use illicit drugs.     ROS   No history of hypertension No history of tingling or numbness in his  feet  LIPIDS: His triglycerides have been nearly 600 in the past. Has not been on any medications partly because of cost  Lab Results  Component Value Date   CHOL 195 05/25/2013   HDL 33.30* 05/25/2013   LDLCALC See Comment mg/dL 4/0/9811   LDLDIRECT 91.4 05/25/2013   TRIG 294.0* 05/25/2013   CHOLHDL 6 05/25/2013     Physical Examination:  BP 112/60  Pulse 92  Temp(Src) 98.3 F (36.8 C)  Resp 12  Ht 5\' 5"  (1.651 m)  Wt 173 lb 4.8 oz (78.608 kg)  BMI 28.84 kg/m2  SpO2 98%       no leg edema present  ASSESSMENT:  1. Diabetes type 2, uncontrolled - 250.02    The patient has poorly controlled diabetes, insulin-dependent  As discussed in history of present illness it is difficult to analyze his home readings as he does not have a record or proper time on his meter which cannot be downloaded Appears to be needing more insulin during the day as blood sugars tend to be higher in the evenings; he will do better with increasing the LANTUS in the morning Also because of lack of postprandial monitoring not clear how much insulin he needs with each meal. Because of higher readings are lunchtime frequently he will need a higher dose of HUMALOG in the morning  He will start using the brand name glucose One Touch  monitor and get the strips from the drug store, discussed when to check the blood sugar as well as blood sugar targets.   HUMALOG: 6-8 units in am based on carbohydrate intake Lunch 10 units Humalog Dinner 12 Humalog and more if higher LANTUS insulin: 30 units in the morning and will increase the dose further if fasting readings are continuing to be high  Encouraged regular walking for exercise He will be scheduled for diabetes education and meal planning instructions Consider stopping Amaryl as it is unlikely to be helpful   2. HYPERTRIGLYCERIDEMIA: His triglycerides are still high; will need followup evaluation when blood sugar is better  Counseling time over 50% of  today's 25 minute visit Christoph Copelan 05/28/2013, 1:21 PM

## 2013-06-06 ENCOUNTER — Emergency Department (HOSPITAL_COMMUNITY)
Admission: EM | Admit: 2013-06-06 | Discharge: 2013-06-06 | Disposition: A | Discharge status: Home / Self Care | Payer: BC Managed Care – PPO | Source: Home / Self Care | Attending: Family Medicine | Admitting: Family Medicine

## 2013-06-06 ENCOUNTER — Encounter (HOSPITAL_COMMUNITY): Payer: Self-pay | Admitting: Emergency Medicine

## 2013-06-06 DIAGNOSIS — A6000 Herpesviral infection of urogenital system, unspecified: Secondary | ICD-10-CM

## 2013-06-06 MED ORDER — VALACYCLOVIR HCL 500 MG PO TABS: 500.0000 mg | ORAL_TABLET | Freq: Two times a day (BID) | ORAL | Status: DC

## 2013-06-06 NOTE — ED Notes (Signed)
Rash to genital area.  Seen by dr Artis Flock prior to this nurse

## 2013-06-06 NOTE — ED Provider Notes (Signed)
CSN: [id number]     Arrival date & time 06/06/13  1712 History   First MD Initiated Contact with Patient 06/06/13 1718     Chief Complaint  Patient presents with  . Rash   (Consider location/radiation/quality/duration/timing/severity/associated sxs/prior Treatment) Patient is a 52 y.o. male presenting with rash. The history is provided by the patient.  Rash Location:  Ano-genital Ano-genital rash location:  Penis Quality: blistering   Severity:  Mild Onset quality:  Gradual Duration:  2 weeks Timing:  Sporadic Progression:  Partially resolved Chronicity:  Recurrent Context comment:  Has had about 5-6 times, after going to strip club.   Past Medical History  Diagnosis Date  . Diabetes mellitus    Past Surgical History  Procedure Laterality Date  . Shoulder surgery     No family history on file. History  Substance Use Topics  . Smoking status: Current Every Day Smoker -- 1.00 packs/day    Types: Cigarettes  . Smokeless tobacco: Not on file  . Alcohol Use: No    Review of Systems  Constitutional: Negative.   Genitourinary: Positive for penile pain. Negative for urgency, scrotal swelling and testicular pain.  Skin: Positive for rash.    Allergies  Review of patient's allergies indicates no known allergies.  Home Medications   Current Outpatient Rx  Name  Route  Sig  Dispense  Refill  . clomiPHENE (CLOMID) 50 MG tablet   Oral   Take 50 mg by mouth daily.         Marland Kitchen glimepiride (AMARYL) 4 MG tablet      TAKE ONE TABLET BY MOUTH ONCE DAILY BEFORE  BREAKFAST   30 tablet   0   . glucose blood (ONETOUCH VERIO) test strip      Use as instructed to check blood sugars 2 times a day   100 each   5   . insulin glargine (LANTUS) 100 UNIT/ML injection   Subcutaneous   Inject 26 Units into the skin at bedtime.         . insulin lispro (HUMALOG) 100 UNIT/ML injection      Take 5 units before breakfast and lunch and 10 before dinner   10 mL   2   .  metFORMIN (GLUCOPHAGE) 1000 MG tablet   Oral   Take 1 tablet (1,000 mg total) by mouth 2 (two) times daily with a meal.   60 tablet   2   . ONETOUCH DELICA LANCETS FINE MISC   Does not apply   1 each by Does not apply route 2 (two) times daily.   100 each   5   . traMADol (ULTRAM) 50 MG tablet   Oral   Take 50 mg by mouth every 6 (six) hours as needed for pain.         . valACYclovir (VALTREX) 500 MG tablet   Oral   Take 1 tablet (500 mg total) by mouth 2 (two) times daily.   6 tablet   3    BP 133/74  Pulse 91  Temp(Src) 98.4 F (36.9 C) (Oral)  Resp 16  SpO2 100% Physical Exam  Nursing note and vitals reviewed. Constitutional: He is oriented to person, place, and time. He appears well-developed and well-nourished.  Abdominal: Soft. Bowel sounds are normal.  Genitourinary: No penile tenderness.  Resolving dry grouped vesicular rash on shaft of penis, mild adenopathy inguinal.  Neurological: He is alert and oriented to person, place, and time.  Skin: Skin is warm and  dry.    ED Course  Procedures (including critical care time) Labs Review Labs Reviewed - No data to display Imaging Review No results found.  EKG Interpretation    Date/Time:    Ventricular Rate:    PR Interval:    QRS Duration:   QT Interval:    QTC Calculation:   R Axis:     Text Interpretation:              MDM   1. Recurrent genital herpes       Linna Hoff, MD 06/06/13 [phone]

## 2013-06-12 ENCOUNTER — Encounter: Payer: BC Managed Care – PPO | Admitting: Nutrition

## 2013-06-18 ENCOUNTER — Encounter: Payer: BC Managed Care – PPO | Attending: Endocrinology | Admitting: Nutrition

## 2013-07-06 ENCOUNTER — Ambulatory Visit (INDEPENDENT_AMBULATORY_CARE_PROVIDER_SITE_OTHER): Payer: BC Managed Care – PPO | Admitting: Endocrinology

## 2013-07-06 ENCOUNTER — Encounter: Payer: Self-pay | Admitting: Endocrinology

## 2013-07-06 ENCOUNTER — Other Ambulatory Visit: Payer: Self-pay | Admitting: *Deleted

## 2013-07-06 VITALS — BP 126/78 | HR 105 | Temp 98.2°F | Resp 12 | Ht 65.0 in | Wt 177.7 lb

## 2013-07-06 DIAGNOSIS — R197 Diarrhea, unspecified: Secondary | ICD-10-CM

## 2013-07-06 DIAGNOSIS — E119 Type 2 diabetes mellitus without complications: Secondary | ICD-10-CM

## 2013-07-06 DIAGNOSIS — IMO0001 Reserved for inherently not codable concepts without codable children: Secondary | ICD-10-CM

## 2013-07-06 DIAGNOSIS — E1165 Type 2 diabetes mellitus with hyperglycemia: Principal | ICD-10-CM

## 2013-07-06 DIAGNOSIS — E785 Hyperlipidemia, unspecified: Secondary | ICD-10-CM

## 2013-07-06 DIAGNOSIS — K529 Noninfective gastroenteritis and colitis, unspecified: Secondary | ICD-10-CM

## 2013-07-06 LAB — GLUCOSE, POCT (MANUAL RESULT ENTRY): POC Glucose: 230 mg/dl — AB (ref 70–99)

## 2013-07-06 NOTE — Progress Notes (Signed)
Patient ID: Christian Navarro, male   DOB: [birth year], 53 y.o.   MRN: [id number]   Reason for Appointment : Followup for Type 2 Diabetes  History of Present Illness:  HUMALOG: 6-8 units in am based on carbohydrate intake Lunch 10 units Humalog Dinner 12 Humalog and more if higher LANTUS insulin: 30 units in the morning and will increase the dose further if fasting readings are continuing to be high         Diagnosis: Type 2 diabetes mellitus, date of diagnosis: 1998 with glucose 578  Past history:    he has been on insulin about 3 years and his blood sugars were quite high before going to insulin and was losing weight. However no detailed records are available. He has been continued on Amaryl and metformin which presumably were started early in the course of his diabetes. He had not been on mealtime insulin and only Lantus Because of his persistently poor control with Lantus alone he was started on Humalog with each meal on his initial consultation  RECENT history:  He continues to have poor control overall with marked increase in A1c in 12/14, similar to previous values He had specific instructions given for his insulin on the last visit including coverage for lunch time Although he has increased his Lantus by 4 units to 30 units he is still not taking any insulin at lunchtime Blood sugar today in the office is 230 after lunch He still appears to have significantly high readings later in the day Fasting readings are better historically He is still not started using the One Touch monitor as directed and did not bring any record of his glucose readings today Compliance with the medical regimen has been  only fair    INSULIN regimen is described as Lantus 30 units am; Humalog 7-8, 0 LUNCH acb 12-15 acs Oral hypoglycemic drugs the patient is taking are: Metformin and Amaryl The diet that the patient has been following is: No particular diet      Monitors blood glucose:  once or twice a day.          Glucometer:  generic Fasting readings:89-150; ACS 180-200  Hypoglycemia frequency:  none recently    Meals: 2-3 meals per day 12 noon, 11 pm   At times he will have more fruits, rarely sweets, no fast food, likes bread, usually low fat meals         Physical activity: exercise:  none          Dietician visit: Most recent: never         Retinal exam: Most recent: 2 years ago.           Complications:  mild erectile dysfunction   Lab Results  Component Value Date   HGBA1C 11.6* 05/25/2013   Lab Results  Component Value Date   MICROALBUR 0.5 05/25/2013   LDLCALC See Comment mg/dL 01/15/6961   CREATININE 0.9 05/25/2013     Problem 2: He is asking about persistent diarrhea, this has been going on for 5 or 6 years approximately He has several loose stools during the day regardless of mealtimes Recently to metformin and the diarrhea is still there; no abdominal pain, nausea or weight loss  Has not had any evaluation from his PCP before      Medication List       This list is accurate as of: 07/06/13  3:52 PM.  Always use your most recent med list.  clomiPHENE 50 MG tablet  Commonly known as:  CLOMID  Take 50 mg by mouth daily.     glucose blood test strip  Commonly known as:  ONETOUCH VERIO  Use as instructed to check blood sugars 2 times a day     insulin glargine 100 UNIT/ML injection  Commonly known as:  LANTUS  Inject 26 Units into the skin at bedtime.     insulin lispro 100 UNIT/ML injection  Commonly known as:  HUMALOG  Take 5 units before breakfast and lunch and 10 before dinner     metFORMIN 1000 MG tablet  Commonly known as:  GLUCOPHAGE  Take 1 tablet (1,000 mg total) by mouth 2 (two) times daily with a meal.     ONETOUCH DELICA LANCETS FINE Misc  1 each by Does not apply route 2 (two) times daily.        Allergies: No Known Allergies  Past Medical History  Diagnosis Date  . Diabetes mellitus     Past Surgical History  Procedure  Laterality Date  . Shoulder surgery      No family history on file.  Social History:  reports that he has been smoking Cigarettes.  He has been smoking about 1.00 pack per day. He does not have any smokeless tobacco history on file. He reports that he does not drink alcohol or use illicit drugs.     ROS   No history of hypertension  LIPIDS: His triglycerides have been nearly 600 in the past. Has not been on any medications partly because of cost LDL has been below 100, triglycerides relatively better last month despite poor glucose control  Lab Results  Component Value Date   CHOL 195 05/25/2013   HDL 33.30* 05/25/2013   LDLCALC See Comment mg/dL 06/18/7827   LDLDIRECT 56.2 05/25/2013   TRIG 294.0* 05/25/2013   CHOLHDL 6 05/25/2013     Physical Examination:  BP 126/78  Pulse 105  Temp(Src) 98.2 F (36.8 C)  Resp 12  Ht 5\' 5"  (1.651 m)  Wt 177 lb 11.2 oz (80.604 kg)  BMI 29.57 kg/m2  SpO2 98%       no leg edema present  ASSESSMENT:  1. Diabetes type 2, uncontrolled - 250.02    He tends to have poorly controlled diabetes was last A1c 11.6 Is clearly insulin deficient and needs basal bolus regimen with enough coverage for his meals Problems identified:  Most likely since his fasting readings are relatively good by history he is having high postprandial readings including today  Not taking any insulin at lunchtime to cover his lunch despite instructions on the last visit and he appears to have high readings before supper history  Has had some weight gain and may not be controlling portions  Tends to have some sweets and a lot of fruits in his diet  No regular exercise  No readings after evening meal to help adjust suppertime coverage  Still using a generic monitor, not clear how accurate this is  Recommendations made:  Start monitoring at least half of the blood sugars after meals and use new One Touchglucose monitor: to get the strips from  [employer]  HUMALOG: 7-9 units in am;  Lunch 6-10 units Humalog  based on carbohydrate intake;  Dinner samedose unless readings are higher after supper LANTUS insulin: 30 units in the morning on waking up Reduce portions of fruits and sweets Stop metformin when finished as it is unlikely to be helping and he is not obese  Most likely does not need Amaryl either  2. Chronic persistent diarrhea he is unlikely to be from metformin as it is persisting. He may have diabetic diarrhea or other etiology, needs GI consultation and will refer  3. Has dyslipidemia with high triglycerides: Will check fasting lipids on the next visit and consider adding fenofibrate, has been previously okay but will need to be monitored periodically  Counseling time over 50% of today's 25 minute visit   [place] Fant 07/06/2013, 3:52 PM   Orders Only on 07/06/2013  Component Date Value Range Status  . POC Glucose 07/06/2013 230* 70 - 99 mg/dl Final

## 2013-07-06 NOTE — Patient Instructions (Addendum)
  HUMALOG: 7-9 units in am;  Lunch 6-10 units Humalog  based on carbohydrate intake;  Dinner same LANTUS insulin: 30 units in the morning on waking up  Please check blood sugars at least half the time about 2 hours after any meal and as directed on waking up. USE ONE TOUH METER Please bring blood sugar monitor to each visit  Stop Metformin

## 2013-07-11 ENCOUNTER — Encounter: Payer: Self-pay | Admitting: Internal Medicine

## 2013-07-24 ENCOUNTER — Ambulatory Visit: Payer: BC Managed Care – PPO | Admitting: Gastroenterology

## 2013-07-30 ENCOUNTER — Other Ambulatory Visit: Payer: Self-pay | Admitting: *Deleted

## 2013-07-30 MED ORDER — INSULIN LISPRO 100 UNIT/ML ~~LOC~~ SOLN: SUBCUTANEOUS | Status: DC

## 2013-08-03 ENCOUNTER — Ambulatory Visit: Payer: BC Managed Care – PPO | Admitting: Internal Medicine

## 2013-10-04 ENCOUNTER — Other Ambulatory Visit: Payer: BC Managed Care – PPO

## 2013-10-10 ENCOUNTER — Ambulatory Visit: Payer: BC Managed Care – PPO | Admitting: Endocrinology

## 2013-10-10 ENCOUNTER — Other Ambulatory Visit: Payer: BC Managed Care – PPO

## 2013-10-10 ENCOUNTER — Other Ambulatory Visit (INDEPENDENT_AMBULATORY_CARE_PROVIDER_SITE_OTHER): Payer: BC Managed Care – PPO

## 2013-10-10 DIAGNOSIS — IMO0001 Reserved for inherently not codable concepts without codable children: Secondary | ICD-10-CM

## 2013-10-10 DIAGNOSIS — E785 Hyperlipidemia, unspecified: Secondary | ICD-10-CM

## 2013-10-10 DIAGNOSIS — E1165 Type 2 diabetes mellitus with hyperglycemia: Principal | ICD-10-CM

## 2013-10-10 LAB — COMPREHENSIVE METABOLIC PANEL
ALK PHOS: 78 U/L (ref 39–117)
ALT: 23 U/L (ref 0–53)
AST: 19 U/L (ref 0–37)
Albumin: 3.7 g/dL (ref 3.5–5.2)
BILIRUBIN TOTAL: 0.3 mg/dL (ref 0.3–1.2)
BUN: 12 mg/dL (ref 6–23)
CO2: 25 mEq/L (ref 19–32)
CREATININE: 0.9 mg/dL (ref 0.4–1.5)
Calcium: 9.2 mg/dL (ref 8.4–10.5)
Chloride: 101 mEq/L (ref 96–112)
GFR: 94.34 mL/min (ref >60.00)
GLUCOSE: 282 mg/dL — AB (ref 70–99)
Potassium: 3.9 mEq/L (ref 3.5–5.1)
Sodium: 135 mEq/L (ref 135–145)
Total Protein: 6.8 g/dL (ref 6.0–8.3)

## 2013-10-10 LAB — MICROALBUMIN / CREATININE URINE RATIO
Creatinine,U: 207.3 mg/dL
Microalb Creat Ratio: 0.5 mg/g (ref 0.0–30.0)
Microalb, Ur: 1 mg/dL (ref 0.0–1.9)

## 2013-10-10 LAB — URINALYSIS, ROUTINE W REFLEX MICROSCOPIC
BILIRUBIN URINE: NEGATIVE
Hgb urine dipstick: NEGATIVE
KETONES UR: NEGATIVE
LEUKOCYTES UA: NEGATIVE
NITRITE: NEGATIVE
PH: 6 (ref 5.0–8.0)
RBC / HPF: NONE SEEN (ref 0–?)
Specific Gravity, Urine: >=1.03 — AB (ref 1.000–1.030)
TOTAL PROTEIN, URINE-UPE24: NEGATIVE
Urine Glucose: 500 — AB
Urobilinogen, UA: 0.2 (ref 0.0–1.0)
WBC, UA: NONE SEEN (ref 0–?)

## 2013-10-10 LAB — LIPID PANEL
CHOL/HDL RATIO: 7
Cholesterol: 203 mg/dL — ABNORMAL HIGH (ref 0–200)
HDL: 29.8 mg/dL — AB (ref >39.00)
LDL CALC: 108 mg/dL — AB (ref 0–99)
TRIGLYCERIDES: 326 mg/dL — AB (ref 0.0–149.0)
VLDL: 65.2 mg/dL — AB (ref 0.0–40.0)

## 2013-10-10 LAB — HEMOGLOBIN A1C: Hgb A1c MFr Bld: 10.2 % — ABNORMAL HIGH (ref 4.6–6.5)

## 2013-10-15 ENCOUNTER — Ambulatory Visit: Payer: BC Managed Care – PPO | Admitting: Endocrinology

## 2013-10-16 ENCOUNTER — Other Ambulatory Visit: Payer: Self-pay | Admitting: *Deleted

## 2013-10-17 ENCOUNTER — Encounter: Payer: Self-pay | Admitting: Endocrinology

## 2013-10-17 ENCOUNTER — Other Ambulatory Visit: Payer: Self-pay | Admitting: *Deleted

## 2013-10-17 ENCOUNTER — Ambulatory Visit (INDEPENDENT_AMBULATORY_CARE_PROVIDER_SITE_OTHER): Payer: BC Managed Care – PPO | Admitting: Endocrinology

## 2013-10-17 VITALS — BP 118/76 | HR 88 | Temp 98.4°F | Resp 12 | Wt 173.0 lb

## 2013-10-17 DIAGNOSIS — E119 Type 2 diabetes mellitus without complications: Secondary | ICD-10-CM

## 2013-10-17 DIAGNOSIS — E785 Hyperlipidemia, unspecified: Secondary | ICD-10-CM

## 2013-10-17 LAB — GLUCOSE, POCT (MANUAL RESULT ENTRY): POC Glucose: 204 mg/dl — AB (ref 70–99)

## 2013-10-17 MED ORDER — INSULIN LISPRO 100 UNIT/ML CARTRIDGE: SUBCUTANEOUS | Status: DC

## 2013-10-17 NOTE — Progress Notes (Signed)
Patient ID: Christian Navarro, male   DOB: [birth year], 53 y.o.   MRN: [id number]   Reason for Appointment : Followup for Type 2 Diabetes  History of Present Illness:  HUMALOG: 6-8 units in am based on carbohydrate intake Lunch 10 units Humalog Dinner 12 Humalog and more if higher LANTUS insulin: 30 units in the morning and will increase the dose further if fasting readings are continuing to be high         Diagnosis: Type 2 diabetes mellitus, date of diagnosis: 1998 with glucose 578  Past history:    he has been on insulin about 3 years and his blood sugars were quite high before going to insulin and was losing weight. However no detailed records are available. He has been continued on Amaryl and metformin which presumably were started early in the course of his diabetes. He had not been on mealtime insulin and only Lantus Because of his persistently poor control with Lantus alone he was started on Humalog with each meal on his initial consultation  RECENT history:  He continues to have poor control overall with marked increase in A1c again, similar to previous values He still claims that his blood sugars are fairly good in the morning when he wakes up but does not usually check readings after meals and not clear how often he is monitoring; did not bring his monitor for download or any record He had specific instructions given to start taking Humalog at lunch time also but he is still not doing this Also ran out of Humalog in the last week Blood sugar today in the office is high today His diet is still poor with unbalanced meals and eating cookies and cakes in the morning before breakfast Compliance with the medical regimen has been  only fair On his last visit metformin was stopped because of his problems with postprandial diarrhea    INSULIN regimen is described as Lantus 30 units am; Humalog 10 before breakfast, 0 LUNCH and 12-15 acs Oral hypoglycemic drugs the patient is taking are: None The  diet that the patient has been following is: No particular diet      Monitors blood glucose:  once a day.         Glucometer:  generic Fasting readings: 120-150 , hs ?    Hypoglycemia frequency:  none recently    Meals: 2-3 meals per day 12 noon, 11 pm   At times he will have more fruits, rarely sweets, no fast food, likes bread, usually low fat meals         Physical activity: exercise:  none          Dietician visit: Most recent: never         Retinal exam: Most recent: 2 years ago.           Complications:  mild erectile dysfunction   Lab Results  Component Value Date   HGBA1C 10.2* 10/10/2013   HGBA1C 11.6* 05/25/2013   Lab Results  Component Value Date   MICROALBUR 1.0 10/10/2013   LDLCALC 108* 10/10/2013   CREATININE 0.9 10/10/2013     Problem 2: He is asking about persistent diarrhea, this has been going on for 5 or 6 years approximately He has several loose stools during the day regardless of mealtimes Recently to metformin and the diarrhea is still there; no abdominal pain, nausea or weight loss  Has not had any evaluation from his PCP before      Medication List  This list is accurate as of: 10/17/13 11:59 PM.  Always use your most recent med list.               clomiPHENE 50 MG tablet  Commonly known as:  CLOMID  Take 50 mg by mouth daily.     glimepiride 4 MG tablet  Commonly known as:  AMARYL  4 mg.     glucose blood test strip  Commonly known as:  ONETOUCH VERIO  Use as instructed to check blood sugars 2 times a day     insulin glargine 100 UNIT/ML injection  Commonly known as:  LANTUS  Inject 26 Units into the skin at bedtime.     insulin lispro 100 UNIT/ML cartridge  Commonly known as:  HUMALOG  Inject 10-15 units into the skin before each meal as instructed.     metFORMIN 1000 MG tablet  Commonly known as:  GLUCOPHAGE  Take 1 tablet (1,000 mg total) by mouth 2 (two) times daily with a meal.     NOVOLOG 100 UNIT/ML injection  Generic  drug:  insulin aspart  Inject 20 Units into the skin 3 (three) times daily before meals. Inject 5 units before breakfast and lunch and 10 units before dinner     ONETOUCH DELICA LANCETS FINE Misc  1 each by Does not apply route 2 (two) times daily.     valACYclovir 500 MG tablet  Commonly known as:  VALTREX        Allergies: No Known Allergies  Past Medical History  Diagnosis Date  . Diabetes mellitus     Past Surgical History  Procedure Laterality Date  . Shoulder surgery      No family history on file.  Social History:  reports that he has been smoking Cigarettes.  He has been smoking about 1.00 pack per day. He does not have any smokeless tobacco history on file. He reports that he does not drink alcohol or use illicit drugs.     ROS   No history of hypertension  LIPIDS: His triglycerides have been nearly 600 in the past. Has not been on any medications partly because of cost Also diabetes unbalanced and sometimes high in carbohydrates  Lab Results  Component Value Date   CHOL 203* 10/10/2013   HDL 29.80* 10/10/2013   LDLCALC 108* 10/10/2013   LDLDIRECT 94.7 05/25/2013   TRIG 326.0* 10/10/2013   CHOLHDL 7 10/10/2013   Diarrhea is better with stopping metformin but still has some postprandial loose stools, has not seen gastroenterologist as suggested  Physical Examination:  BP 118/76  Pulse 88  Temp(Src) 98.4 F (36.9 C) (Oral)  Resp 12  Wt 173 lb (78.472 kg)  SpO2 97%       no leg edema present  Small corn on the distal foot. Has intertrigo between the fourth and fifth toes bilaterally  ASSESSMENT:  1. Diabetes type 2, uncontrolled - 250.02    He tends to have poorly controlled diabetes with A1c again over 10% Is clearly insulin deficient and needs basal bolus regimen with enough coverage for all his meals See history of present illness for current blood sugar patterns, day-to-day management and problems identified Problems to be addressed:  Need  to take Humalog insulin at lunchtime to cover his lunch   Has had some weight gain and may not be controlling portions  Needs to reduce sweets and  fruits in his diet  He needs regular exercise  Start checking readings after evening meal to  help adjust suppertime coverage  Needs to bring blood sugar record or monitor to each office visit and also more frequent glucose monitoring  Recommendations made:  Improved diet, discussed how to add protein to breakfast and avoid sweets  Take Humalog 10 units regularly at lunch  Follow instructions for glucose monitoring bloating after meals  Consultation with dietitian  2. Has dyslipidemia with high triglycerides: He needs to better diet with reduced carbohydrates and better blood sugar control, consider adding fenofibrate  3. Foot care: He needs Lamisil for his intertrigo and he can use a corn pad for his corn on foot cautiously since he does not have neuropathy  Counseling time over 50% of today's 25 minute visit   Reather Littlerjay Senna Lape 10/19/2013, 8:07 AM   Office Visit on 10/17/2013  Component Date Value Ref Range Status  . POC Glucose 10/17/2013 204* 70 - 99 mg/dl Final

## 2013-10-17 NOTE — Patient Instructions (Signed)
Please check blood sugars at least half the time about 2 hours after any meal and as directed on waking up. Please bring blood sugar monitor to each visit  Must take 10 Humalog before lunch also  No cakes and sweets and use protein in am  Lamisil for toe infection  Corn pad for left foot, change daily and stop when corn just coming off

## 2013-10-18 ENCOUNTER — Telehealth: Payer: Self-pay | Admitting: *Deleted

## 2013-10-18 ENCOUNTER — Other Ambulatory Visit: Payer: Self-pay | Admitting: *Deleted

## 2013-10-18 MED ORDER — INSULIN ASPART 100 UNIT/ML ~~LOC~~ SOLN: SUBCUTANEOUS | Status: DC

## 2013-10-18 NOTE — Telephone Encounter (Signed)
Instructed patient to call his insurance company to find out which insulin is covered

## 2013-10-23 ENCOUNTER — Telehealth: Payer: Self-pay | Admitting: Endocrinology

## 2013-10-23 NOTE — Telephone Encounter (Signed)
Insulin rx needs to be changed to what his insurance will cover. Can we figure it out and call in what they will cover he is almost out.

## 2013-10-24 NOTE — Telephone Encounter (Signed)
Patient was instructed to call his insurance company to find out what was covered.

## 2013-10-25 ENCOUNTER — Telehealth: Payer: Self-pay | Admitting: Endocrinology

## 2013-10-25 ENCOUNTER — Other Ambulatory Visit: Payer: Self-pay | Admitting: *Deleted

## 2013-10-25 MED ORDER — INSULIN GLARGINE 100 UNIT/ML ~~LOC~~ SOLN: 26.0000 [IU] | Freq: Every day | SUBCUTANEOUS | Status: DC

## 2013-10-25 NOTE — Telephone Encounter (Signed)
Is the pt supposed to have lantus called in along with the novolog. Please call the pt back with the decision. Thank you!

## 2013-10-26 ENCOUNTER — Ambulatory Visit: Payer: BC Managed Care – PPO | Admitting: Dietician

## 2014-01-17 ENCOUNTER — Encounter: Payer: Self-pay | Admitting: *Deleted

## 2014-01-17 ENCOUNTER — Ambulatory Visit: Payer: BC Managed Care – PPO | Admitting: Endocrinology

## 2014-01-17 DIAGNOSIS — Z0289 Encounter for other administrative examinations: Secondary | ICD-10-CM

## 2015-01-05 ENCOUNTER — Encounter (HOSPITAL_COMMUNITY): Payer: Self-pay | Admitting: Nurse Practitioner

## 2015-01-05 ENCOUNTER — Emergency Department (HOSPITAL_COMMUNITY)
Admission: EM | Admit: 2015-01-05 | Discharge: 2015-01-05 | Disposition: A | Discharge status: Home / Self Care | Payer: Self-pay | Attending: Emergency Medicine | Admitting: Emergency Medicine

## 2015-01-05 ENCOUNTER — Emergency Department (HOSPITAL_COMMUNITY): Payer: Self-pay

## 2015-01-05 DIAGNOSIS — S0990XA Unspecified injury of head, initial encounter: Secondary | ICD-10-CM | POA: Insufficient documentation

## 2015-01-05 DIAGNOSIS — Z79899 Other long term (current) drug therapy: Secondary | ICD-10-CM | POA: Insufficient documentation

## 2015-01-05 DIAGNOSIS — Z720 Tobacco use: Secondary | ICD-10-CM | POA: Insufficient documentation

## 2015-01-05 DIAGNOSIS — W19XXXA Unspecified fall, initial encounter: Secondary | ICD-10-CM

## 2015-01-05 DIAGNOSIS — Z794 Long term (current) use of insulin: Secondary | ICD-10-CM | POA: Insufficient documentation

## 2015-01-05 DIAGNOSIS — Y92322 Soccer field as the place of occurrence of the external cause: Secondary | ICD-10-CM | POA: Insufficient documentation

## 2015-01-05 DIAGNOSIS — S199XXA Unspecified injury of neck, initial encounter: Secondary | ICD-10-CM | POA: Insufficient documentation

## 2015-01-05 DIAGNOSIS — Y9366 Activity, soccer: Secondary | ICD-10-CM | POA: Insufficient documentation

## 2015-01-05 DIAGNOSIS — Y998 Other external cause status: Secondary | ICD-10-CM | POA: Insufficient documentation

## 2015-01-05 DIAGNOSIS — E119 Type 2 diabetes mellitus without complications: Secondary | ICD-10-CM | POA: Insufficient documentation

## 2015-01-05 DIAGNOSIS — W01198A Fall on same level from slipping, tripping and stumbling with subsequent striking against other object, initial encounter: Secondary | ICD-10-CM | POA: Insufficient documentation

## 2015-01-05 MED ORDER — HYDROCODONE-ACETAMINOPHEN 5-325 MG PO TABS: 1.0000 | ORAL_TABLET | Freq: Four times a day (QID) | ORAL | Status: DC | PRN

## 2015-01-05 MED ORDER — ACETAMINOPHEN 500 MG PO TABS
1000.0000 mg | ORAL_TABLET | Freq: Once | ORAL | Status: AC
  Administered 2015-01-05: 1000 mg via ORAL
  Filled 2015-01-05: qty 2

## 2015-01-05 NOTE — Progress Notes (Signed)
Orthopedic Tech Progress Note Patient Details:  Christian Navarro 1960/07/14 [id number]  Ortho Devices Type of Ortho Device: Soft collar Ortho Device/Splint Location: neck Ortho Device/Splint Interventions: Ordered, Application   Jennye Moccasin 01/05/2015, 4:25 PM

## 2015-01-05 NOTE — ED Provider Notes (Addendum)
CSN: [id number]     Arrival date & time 01/05/15  1306 History   First MD Initiated Contact with Patient 01/05/15 1318     Chief Complaint  Patient presents with  . Head Injury     (Consider location/radiation/quality/duration/timing/severity/associated sxs/prior Treatment) HPI Patient reports he was putting soccer yesterday when he did a "double kick" and landed on his occiput. He complains of occipital headache and posterior neck pain since the event. He treated himself with Motrin with partial relief. Improved with remaining still Neck pain is worse with flexing and extending his neck. Pain is nonradiating. No focal numbness or weakness No loss of consciousness no other associated symptoms Past Medical History  Diagnosis Date  . Diabetes mellitus    Past Surgical History  Procedure Laterality Date  . Shoulder surgery     History reviewed. No pertinent family history. History  Substance Use Topics  . Smoking status: Current Every Day Smoker -- 1.00 packs/day    Types: Cigarettes  . Smokeless tobacco: Not on file  . Alcohol Use: No    Review of Systems  Constitutional: Negative.   Respiratory: Negative.   Cardiovascular: Negative.   Gastrointestinal: Negative.   Musculoskeletal: Positive for neck pain.  Skin: Negative.   Allergic/Immunologic: Positive for immunocompromised state.       Diabetic  Neurological: Positive for headaches.  Psychiatric/Behavioral: Negative.   All other systems reviewed and are negative.     Allergies  Review of patient's allergies indicates no known allergies.  Home Medications   Prior to Admission medications   Medication Sig Start Date End Date Taking? Authorizing Provider  clomiPHENE (CLOMID) 50 MG tablet Take 50 mg by mouth daily.    Historical Provider, MD  glimepiride (AMARYL) 4 MG tablet 4 mg. 06/12/13   Historical Provider, MD  glucose blood (ONETOUCH VERIO) test strip Use as instructed to check blood sugars 2 times a day  05/25/13   Reather Littler, MD  insulin aspart (NOVOLOG) 100 UNIT/ML injection Inject 5 units before breakfast and lunch and 10 units before dinner 10/18/13   Reather Littler, MD  insulin glargine (LANTUS) 100 UNIT/ML injection Inject 0.26 mLs (26 Units total) into the skin at bedtime. 10/25/13   Reather Littler, MD  metFORMIN (GLUCOPHAGE) 1000 MG tablet Take 1 tablet (1,000 mg total) by mouth 2 (two) times daily with a meal. 10/12/12   Shanker Levora Dredge, MD  [facility] DELICA LANCETS FINE MISC 1 each by Does not apply route 2 (two) times daily. 05/25/13   Reather Littler, MD  valACYclovir (VALTREX) 500 MG tablet  06/18/13   Historical Provider, MD   BP 129/61 mmHg  Pulse 88  Temp(Src) 98 F (36.7 C) (Oral)  Resp 20  Wt 172 lb (78.019 kg)  SpO2 98% Physical Exam  Constitutional: He is oriented to person, place, and time. He appears well-developed and well-nourished. No distress.  Alert Glasgow Coma Score 15  HENT:  Head: Normocephalic and atraumatic.  Eyes: Conjunctivae are normal. Pupils are equal, round, and reactive to light.  Neck: Neck supple. No tracheal deviation present. No thyromegaly present.  Cardiovascular: Normal rate and regular rhythm.   No murmur heard. Pulmonary/Chest: Effort normal and breath sounds normal.  Abdominal: Soft. Bowel sounds are normal. He exhibits no distension. There is no tenderness.  Musculoskeletal: Normal range of motion. He exhibits no edema or tenderness.  Tender at occiput and along the cervical spine. Thoracic spine and lumbar spine nontender. Pelvis stable nontender. All 4 extremities no contusion  abrasion or tenderness neurovascularly intact.  Neurological: He is alert and oriented to person, place, and time. He has normal reflexes. Coordination normal.  Gait normal motor strength 5 over 5 overall DTRs symmetric bilaterally at knee jerk ankle jerk and biceps historical and bilaterally  Skin: Skin is warm and dry. No rash noted.  Psychiatric: He has a normal mood and  affect.  Nursing note and vitals reviewed.   ED Course  Procedures (including critical care time) Labs Review Labs Reviewed - No data to display  Imaging Review No results found. Heart cervical collar placed on patient  EKG Interpretation None     4:20 PM patient alert and motor Glasgow Coma Score 15 Results for orders placed or performed in visit on 10/17/13  POCT Glucose (CBG)  Result Value Ref Range   POC Glucose 204 (A) 70 - 99 mg/dl   Ct Head Wo Contrast  01/05/2015   CLINICAL DATA:  Status post fall.  Loss of consciousness.  EXAM: CT HEAD WITHOUT CONTRAST  CT CERVICAL SPINE WITHOUT CONTRAST  TECHNIQUE: Multidetector CT imaging of the head and cervical spine was performed following the standard protocol without intravenous contrast. Multiplanar CT image reconstructions of the cervical spine were also generated.  COMPARISON:  None.  FINDINGS: CT HEAD FINDINGS  There is no evidence of mass effect, midline shift or extra-axial fluid collections. There is no evidence of a space-occupying lesion or intracranial hemorrhage. There is no evidence of a cortical-based area of acute infarction.  The ventricles and sulci are appropriate for the patient's age. The basal cisterns are patent.  Visualized portions of the orbits are unremarkable. The visualized portions of the paranasal sinuses and mastoid air cells are unremarkable.  The osseous structures are unremarkable.  CT CERVICAL SPINE FINDINGS  The alignment is anatomic. The vertebral body heights are maintained. There is no acute fracture. There is no static listhesis. The prevertebral soft tissues are normal. The intraspinal soft tissues are not fully imaged on this examination due to poor soft tissue contrast, but there is no gross soft tissue abnormality.  There is degenerative disc disease at C3-4 and C4-5. There is a mild broad-based disc bulge at C4-5 and C7-T1. There is bilateral uncovertebral degenerative change at C4-5 with mild  bilateral foraminal stenosis. There is right uncovertebral degenerative change with right foraminal stenosis C5-6.  The visualized portions of the lung apices demonstrate no focal abnormality.  IMPRESSION: 1. No acute intracranial pathology. 2. No acute osseous injury of the cervical spine.   Electronically Signed   By: Elige Ko   On: 01/05/2015 15:56   Ct Cervical Spine Wo Contrast  01/05/2015   CLINICAL DATA:  Status post fall.  Loss of consciousness.  EXAM: CT HEAD WITHOUT CONTRAST  CT CERVICAL SPINE WITHOUT CONTRAST  TECHNIQUE: Multidetector CT imaging of the head and cervical spine was performed following the standard protocol without intravenous contrast. Multiplanar CT image reconstructions of the cervical spine were also generated.  COMPARISON:  None.  FINDINGS: CT HEAD FINDINGS  There is no evidence of mass effect, midline shift or extra-axial fluid collections. There is no evidence of a space-occupying lesion or intracranial hemorrhage. There is no evidence of a cortical-based area of acute infarction.  The ventricles and sulci are appropriate for the patient's age. The basal cisterns are patent.  Visualized portions of the orbits are unremarkable. The visualized portions of the paranasal sinuses and mastoid air cells are unremarkable.  The osseous structures are unremarkable.  CT  CERVICAL SPINE FINDINGS  The alignment is anatomic. The vertebral body heights are maintained. There is no acute fracture. There is no static listhesis. The prevertebral soft tissues are normal. The intraspinal soft tissues are not fully imaged on this examination due to poor soft tissue contrast, but there is no gross soft tissue abnormality.  There is degenerative disc disease at C3-4 and C4-5. There is a mild broad-based disc bulge at C4-5 and C7-T1. There is bilateral uncovertebral degenerative change at C4-5 with mild bilateral foraminal stenosis. There is right uncovertebral degenerative change with right foraminal  stenosis C5-6.  The visualized portions of the lung apices demonstrate no focal abnormality.  IMPRESSION: 1. No acute intracranial pathology. 2. No acute osseous injury of the cervical spine.   Electronically Signed   By: Elige Ko   On: 01/05/2015 15:56   425 pm pt comfortable in soft cervical collar MDM  Plan soft cervical collar. Prescription Norco. Tylenol for mild pain. Prescription Norco. [facility] if significant pain one week Diagnosis #1 fall #2 postconcussive headache #3 cervical strain Final diagnoses:  None        Doug Sou, MD 01/05/15 1621  Doug Sou, MD 01/05/15 1627

## 2015-01-05 NOTE — ED Notes (Signed)
c-collar applied to pt.

## 2015-01-05 NOTE — ED Notes (Signed)
Patient transported to CT 

## 2015-01-05 NOTE — Discharge Instructions (Signed)
Take Tylenol as directed for mild pain or the pain medicine prescribed for bad pain. Don't take Tylenol with the pain medicine prescribed together as the combination can be dangerous. Wear the soft collar as needed for comfort. Follow up with your doctors at [facility] if you continue to have significant pain in a week.

## 2015-01-05 NOTE — ED Notes (Signed)
He fell backwards playing soccer yesterday and hit the back of his head. He states he did lose consciousness for a brief moment. He c/o posterior head and neck pain today. He denies n/v, vision changes. He is A&ox4.

## 2015-09-25 ENCOUNTER — Ambulatory Visit (HOSPITAL_COMMUNITY): Admission: EM | Admit: 2015-09-25 | Discharge: 2015-09-25 | Discharge status: Absent without leave | Payer: Self-pay

## 2015-09-29 ENCOUNTER — Encounter: Payer: Self-pay | Admitting: Internal Medicine

## 2015-09-29 ENCOUNTER — Ambulatory Visit: Payer: Self-pay | Attending: Internal Medicine | Admitting: Internal Medicine

## 2015-09-29 VITALS — BP 122/72 | HR 91 | Temp 98.2°F | Resp 18 | Ht 65.0 in | Wt 181.2 lb

## 2015-09-29 DIAGNOSIS — E1165 Type 2 diabetes mellitus with hyperglycemia: Secondary | ICD-10-CM

## 2015-09-29 DIAGNOSIS — N469 Male infertility, unspecified: Secondary | ICD-10-CM

## 2015-09-29 DIAGNOSIS — Z720 Tobacco use: Secondary | ICD-10-CM

## 2015-09-29 DIAGNOSIS — G4700 Insomnia, unspecified: Secondary | ICD-10-CM

## 2015-09-29 DIAGNOSIS — E1169 Type 2 diabetes mellitus with other specified complication: Secondary | ICD-10-CM

## 2015-09-29 DIAGNOSIS — Z79899 Other long term (current) drug therapy: Secondary | ICD-10-CM | POA: Insufficient documentation

## 2015-09-29 DIAGNOSIS — E119 Type 2 diabetes mellitus without complications: Secondary | ICD-10-CM

## 2015-09-29 DIAGNOSIS — Z794 Long term (current) use of insulin: Secondary | ICD-10-CM | POA: Insufficient documentation

## 2015-09-29 DIAGNOSIS — E785 Hyperlipidemia, unspecified: Secondary | ICD-10-CM

## 2015-09-29 DIAGNOSIS — F17210 Nicotine dependence, cigarettes, uncomplicated: Secondary | ICD-10-CM | POA: Insufficient documentation

## 2015-09-29 DIAGNOSIS — Z9114 Patient's other noncompliance with medication regimen: Secondary | ICD-10-CM

## 2015-09-29 DIAGNOSIS — Z1211 Encounter for screening for malignant neoplasm of colon: Secondary | ICD-10-CM

## 2015-09-29 LAB — CBC WITH DIFFERENTIAL/PLATELET
BASOS ABS: 0 {cells}/uL (ref 0–200)
Basophils Relative: 0 %
Eosinophils Absolute: 128 cells/uL (ref 15–500)
Eosinophils Relative: 2 %
HEMATOCRIT: 42.9 % (ref 38.5–50.0)
HEMOGLOBIN: 14.5 g/dL (ref 13.2–17.1)
LYMPHS ABS: 3264 {cells}/uL (ref 850–3900)
LYMPHS PCT: 51 %
MCH: 28.1 pg (ref 27.0–33.0)
MCHC: 33.8 g/dL (ref 32.0–36.0)
MCV: 83.1 fL (ref 80.0–100.0)
MPV: 10 fL (ref 7.5–12.5)
Monocytes Absolute: 512 cells/uL (ref 200–950)
Monocytes Relative: 8 %
Neutro Abs: 2496 cells/uL (ref 1500–7800)
Neutrophils Relative %: 39 %
Platelets: 189 10*3/uL (ref 140–400)
RBC: 5.16 MIL/uL (ref 4.20–5.80)
RDW: 14 % (ref 11.0–15.0)
WBC: 6.4 10*3/uL (ref 3.8–10.8)

## 2015-09-29 LAB — GLUCOSE, POCT (MANUAL RESULT ENTRY): POC Glucose: 202 mg/dl — AB (ref 70–99)

## 2015-09-29 LAB — POCT GLYCOSYLATED HEMOGLOBIN (HGB A1C): HEMOGLOBIN A1C: 11.1

## 2015-09-29 MED ORDER — INSULIN GLARGINE 100 UNIT/ML ~~LOC~~ SOLN: 35.0000 [IU] | Freq: Every day | SUBCUTANEOUS | Status: DC

## 2015-09-29 MED ORDER — TOLNAFTATE 1 % EX POWD: 1.0000 "application " | Freq: Two times a day (BID) | CUTANEOUS | Status: DC

## 2015-09-29 MED ORDER — METFORMIN HCL 1000 MG PO TABS: 1000.0000 mg | ORAL_TABLET | Freq: Two times a day (BID) | ORAL | Status: DC

## 2015-09-29 MED ORDER — INSULIN ASPART 100 UNIT/ML ~~LOC~~ SOLN: SUBCUTANEOUS | Status: DC

## 2015-09-29 MED ORDER — GLUCOSE BLOOD VI STRP: ORAL_STRIP | Status: DC

## 2015-09-29 MED ORDER — TRUEPLUS LANCETS 28G MISC: 1.0000 | Freq: Three times a day (TID) | Status: DC

## 2015-09-29 MED ORDER — TRUE METRIX METER W/DEVICE KIT: 1.0000 | PACK | Freq: Three times a day (TID) | Status: DC

## 2015-09-29 MED FILL — TRUE METRIX GLUCOSE TEST ST: 30 days supply | Qty: 100 | Fill #0

## 2015-09-29 MED FILL — !LANTUS 100 UNITS/ML VIAL: 100 | 30 days supply | Qty: 10 | Fill #0

## 2015-09-29 MED FILL — TRUEplus LANCETS 28G MISC: 33 days supply | Qty: 100 | Fill #0

## 2015-09-29 MED FILL — ?METFORMIN HCL 1,000 MG TAB: 1000 | 30 days supply | Qty: 60 | Fill #0

## 2015-09-29 MED FILL — !NOVOLOG 100UNITS/ML VIAL: 100/ML | 30 days supply | Qty: 10 | Fill #0

## 2015-09-29 MED FILL — TRUE METRIX BLOOD GLUCOSE M: W/DEVICE | 30 days supply | Qty: 1 | Fill #0

## 2015-09-29 NOTE — Progress Notes (Signed)
Patient is here to Establish Care  Patient denies pain at this time.  Patient has complained of feeling fatigue.  Patient also is hesitant to speak about home concerns.

## 2015-09-29 NOTE — Patient Instructions (Signed)
- fu w/ [facility] 2 wks for dm chk  Diabetes Mellitus and Food It is important for you to manage your blood sugar (glucose) level. Your blood glucose level can be greatly affected by what you eat. Eating healthier foods in the appropriate amounts throughout the day at about the same time each day will help you control your blood glucose level. It can also help slow or prevent worsening of your diabetes mellitus. Healthy eating may even help you improve the level of your blood pressure and reach or maintain a healthy weight.  General recommendations for healthful eating and cooking habits include:  Eating meals and snacks regularly. Avoid going long periods of time without eating to lose weight.  Eating a diet that consists mainly of plant-based foods, such as fruits, vegetables, nuts, legumes, and whole grains.  Using low-heat cooking methods, such as baking, instead of high-heat cooking methods, such as deep frying. Work with your dietitian to make sure you understand how to use the Nutrition Facts information on food labels. HOW CAN FOOD AFFECT ME? Carbohydrates Carbohydrates affect your blood glucose level more than any other type of food. Your dietitian will help you determine how many carbohydrates to eat at each meal and teach you how to count carbohydrates. Counting carbohydrates is important to keep your blood glucose at a healthy level, especially if you are using insulin or taking certain medicines for diabetes mellitus. Alcohol Alcohol can cause sudden decreases in blood glucose (hypoglycemia), especially if you use insulin or take certain medicines for diabetes mellitus. Hypoglycemia can be a life-threatening condition. Symptoms of hypoglycemia (sleepiness, dizziness, and disorientation) are similar to symptoms of having too much alcohol.  If your health care provider has given you approval to drink alcohol, do so in moderation and use the following guidelines:  Women should not  have more than one drink per day, and men should not have more than two drinks per day. One drink is equal to:  12 oz of beer.  5 oz of wine.  1 oz of hard liquor.  Do not drink on an empty stomach.  Keep yourself hydrated. Have water, diet soda, or unsweetened iced tea.  Regular soda, juice, and other mixers might contain a lot of carbohydrates and should be counted. WHAT FOODS ARE NOT RECOMMENDED? As you make food choices, it is important to remember that all foods are not the same. Some foods have fewer nutrients per serving than other foods, even though they might have the same number of calories or carbohydrates. It is difficult to get your body what it needs when you eat foods with fewer nutrients. Examples of foods that you should avoid that are high in calories and carbohydrates but low in nutrients include:  Trans fats (most processed foods list trans fats on the Nutrition Facts label).  Regular soda.  Juice.  Candy.  Sweets, such as cake, pie, doughnuts, and cookies.  Fried foods. WHAT FOODS CAN I EAT? Eat nutrient-rich foods, which will nourish your body and keep you healthy. The food you should eat also will depend on several factors, including:  The calories you need.  The medicines you take.  Your weight.  Your blood glucose level.  Your blood pressure level.  Your cholesterol level. You should eat a variety of foods, including:  Protein.  Lean cuts of meat.  Proteins low in saturated fats, such as fish, egg whites, and beans. Avoid processed meats.  Fruits and vegetables.  Fruits and vegetables that  may help control blood glucose levels, such as apples, mangoes, and yams.  Dairy products.  Choose fat-free or low-fat dairy products, such as milk, yogurt, and cheese.  Grains, bread, pasta, and rice.  Choose whole grain products, such as multigrain bread, whole oats, and brown rice. These foods may help control blood pressure.  Fats.  Foods  containing healthful fats, such as nuts, avocado, olive oil, canola oil, and fish. DOES EVERYONE WITH DIABETES MELLITUS HAVE THE SAME MEAL PLAN? Because every person with diabetes mellitus is different, there is not one meal plan that works for everyone. It is very important that you meet with a dietitian who will help you create a meal plan that is just right for you.   This information is not intended to replace advice given to you by your health care provider. Make sure you discuss any questions you have with your health care provider.   Document Released: 02/25/2005 Document Revised: 06/21/2014 Document Reviewed: 04/27/2013 Elsevier Interactive Patient Education 2016 Elsevier Inc.   - Blood Glucose Monitoring, Adult Monitoring your blood glucose (also know as blood sugar) helps you to manage your diabetes. It also helps you and your health care provider monitor your diabetes and determine how well your treatment plan is working. WHY SHOULD YOU MONITOR YOUR BLOOD GLUCOSE?  It can help you understand how food, exercise, and medicine affect your blood glucose.  It allows you to know what your blood glucose is at any given moment. You can quickly tell if you are having low blood glucose (hypoglycemia) or high blood glucose (hyperglycemia).  It can help you and your health care provider know how to adjust your medicines.  It can help you understand how to manage an illness or adjust medicine for exercise. WHEN SHOULD YOU TEST? Your health care provider will help you decide how often you should check your blood glucose. This may depend on the type of diabetes you have, your diabetes control, or the types of medicines you are taking. Be sure to write down all of your blood glucose readings so that this information can be reviewed with your health care provider. See below for examples of testing times that your health care provider may suggest. Type 1 Diabetes  Test at least 2 times per day if  your diabetes is well controlled, if you are using an insulin pump, or if you perform multiple daily injections.  If your diabetes is not well controlled or if you are sick, you may need to test more often.  It is a good idea to also test:  Before every insulin injection.  Before and after exercise.  Between meals and 2 hours after a meal.  Occasionally between 2:00 a.m. and 3:00 a.m. Type 2 Diabetes  If you are taking insulin, test at least 2 times per day. However, it is best to test before every insulin injection.  If you take medicines by mouth (orally), test 2 times a day.  If you are on a controlled diet, test once a day.  If your diabetes is not well controlled or if you are sick, you may need to monitor more often. HOW TO MONITOR YOUR BLOOD GLUCOSE Supplies Needed  Blood glucose meter.  Test strips for your meter. Each meter has its own strips. You must use the strips that go with your own meter.  A pricking needle (lancet).  A device that holds the lancet (lancing device).  A journal or log book to write down your results.  Procedure  Wash your hands with soap and water. Alcohol is not preferred.  Prick the side of your finger (not the tip) with the lancet.  Gently milk the finger until a small drop of blood appears.  Follow the instructions that come with your meter for inserting the test strip, applying blood to the strip, and using your blood glucose meter. Other Areas to Get Blood for Testing Some meters allow you to use other areas of your body (other than your finger) to test your blood. These areas are called alternative sites. The most common alternative sites are:  The forearm.  The thigh.  The back area of the lower leg.  The palm of the hand. The blood flow in these areas is slower. Therefore, the blood glucose values you get may be delayed, and the numbers are different from what you would get from your fingers. Do not use alternative sites if  you think you are having hypoglycemia. Your reading will not be accurate. Always use a finger if you are having hypoglycemia. Also, if you cannot feel your lows (hypoglycemia unawareness), always use your fingers for your blood glucose checks. ADDITIONAL TIPS FOR GLUCOSE MONITORING  Do not reuse lancets.  Always carry your supplies with you.  All blood glucose meters have a 24-hour "hotline" number to call if you have questions or need help.  Adjust (calibrate) your blood glucose meter with a control solution after finishing a few boxes of strips. BLOOD GLUCOSE RECORD KEEPING It is a good idea to keep a daily record or log of your blood glucose readings. Most glucose meters, if not all, keep your glucose records stored in the meter. Some meters come with the ability to download your records to your home computer. Keeping a record of your blood glucose readings is especially helpful if you are wanting to look for patterns. Make notes to go along with the blood glucose readings because you might forget what happened at that exact time. Keeping good records helps you and your health care provider to work together to achieve good diabetes management.    This information is not intended to replace advice given to you by your health care provider. Make sure you discuss any questions you have with your health care provider.   Document Released: 06/03/2003 Document Revised: 06/21/2014 Document Reviewed: 10/23/2012 Elsevier Interactive Patient Education 2016 [employer]  - Diabetes and Exercise Exercising regularly is important. It is not just about losing weight. It has many health benefits, such as:  Improving your overall fitness, flexibility, and endurance.  Increasing your bone density.  Helping with weight control.  Decreasing your body fat.  Increasing your muscle strength.  Reducing stress and tension.  Improving your overall health. People with diabetes who exercise gain  additional benefits because exercise:  Reduces appetite.  Improves the body's use of blood sugar (glucose).  Helps lower or control blood glucose.  Decreases blood pressure.  Helps control blood lipids (such as cholesterol and triglycerides).  Improves the body's use of the hormone insulin by:  Increasing the body's insulin sensitivity.  Reducing the body's insulin needs.  Decreases the risk for heart disease because exercising:  Lowers cholesterol and triglycerides levels.  Increases the levels of good cholesterol (such as high-density lipoproteins [HDL]) in the body.  Lowers blood glucose levels. YOUR ACTIVITY PLAN  Choose an activity that you enjoy, and set realistic goals. To exercise safely, you should begin practicing any new physical activity slowly, and gradually increase the  intensity of the exercise over time. Your health care provider or diabetes educator can help create an activity plan that works for you. General recommendations include:  Encouraging children to engage in at least 60 minutes of physical activity each day.  Stretching and performing strength training exercises, such as yoga or weight lifting, at least 2 times per week.  Performing a total of at least 150 minutes of moderate-intensity exercise each week, such as brisk walking or water aerobics.  Exercising at least 3 days per week, making sure you allow no more than 2 consecutive days to pass without exercising.  Avoiding long periods of inactivity (90 minutes or more). When you have to spend an extended period of time sitting down, take frequent breaks to walk or stretch. RECOMMENDATIONS FOR EXERCISING WITH TYPE 1 OR TYPE 2 DIABETES   Check your blood glucose before exercising. If blood glucose levels are greater than 240 mg/dL, check for urine ketones. Do not exercise if ketones are present.  Avoid injecting insulin into areas of the body that are going to be exercised. For example, avoid  injecting insulin into:  The arms when playing tennis.  The legs when jogging.  Keep a record of:  Food intake before and after you exercise.  Expected peak times of insulin action.  Blood glucose levels before and after you exercise.  The type and amount of exercise you have done.  Review your records with your health care provider. Your health care provider will help you to develop guidelines for adjusting food intake and insulin amounts before and after exercising.  If you take insulin or oral hypoglycemic agents, watch for signs and symptoms of hypoglycemia. They include:  Dizziness.  Shaking.  Sweating.  Chills.  Confusion.  Drink plenty of water while you exercise to prevent dehydration or heat stroke. Body water is lost during exercise and must be replaced.  Talk to your health care provider before starting an exercise program to make sure it is safe for you. Remember, almost any type of activity is better than none.   This information is not intended to replace advice given to you by your health care provider. Make sure you discuss any questions you have with your health care provider.   Document Released: 08/21/2003 Document Revised: 10/15/2014 Document Reviewed: 11/07/2012 Elsevier Interactive Patient Education 2016 [employer].  -

## 2015-09-29 NOTE — Progress Notes (Signed)
Christian Navarro, is a 55 y.o. male  [id number]  [id number]  DOB - 02-Jul-1960  CC:  Chief Complaint  Patient presents with  . Establish Care    DM       HPI: Christian Navarro is a 55 y.o. male here today to establish medical care.  Pt is new to our clinic. He use to see Dr Lucianne Muss endocrine, last seen 2015 for dm.  He has had longstanding ooc dm since 1998, and has been on lantus since 2011.  He has subsequently been lost to f/u, and getting intemmittant meds from Doctor in [place].  He currently only has very little of lantus and ran out of novolog several weeks-months ago.  He notes he takes lantus 35 units at bedtime, and novolog 12-15 units before breakfast and lunch, doesn't take it before dinner.  He states his eating has been off, sometimes only eating 2 meals /day.  Sometimes, eats dinner at 2-3am in am, and doesn't get to bed til early am. For instance, didn't fall asleep til 630am (this am.) Denies drinking coffee in later afternoons, but does drink 1 [employer] coffee w/ creamcheese bagle every am.  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 Of note, he and his wife are seeing Doctors in [place] to fertilization issues in their attempts to have baby. He states his wife is healthy, but he has slow moving sperm, and currently taking high dose Multivitamins from [place] (he could not tell me what).  +smokes 1ppd, last week he tried stopping x 1 wk, but he than had a death of a friend (young, in 9s) suddenly, so started smoking again.    Patient has No headache, No chest pain, No abdominal pain - No Nausea, No new weakness tingling or numbness, No Cough - SOB.  No Known Allergies Past Medical History  Diagnosis Date  . Diabetes mellitus    Current Outpatient Prescriptions on File Prior to Visit  Medication Sig Dispense Refill  . ROYAL JELLY PO Take 1 tablet by mouth daily.    Marland Kitchen HYDROcodone-acetaminophen (NORCO) 5-325 MG per tablet Take 1-2  tablets by mouth every 6 (six) hours as needed for severe pain. (Patient not taking: Reported on 09/29/2015) 10 tablet 0   No current facility-administered medications on file prior to visit.   History reviewed. No pertinent family history. Social History   Social History  . Marital Status: Married    Spouse Name: N/A  . Number of Children: N/A  . Years of Education: N/A   Occupational History  . Not on file.   Social History Main Topics  . Smoking status: Current Every Day Smoker -- 1.00 packs/day    Types: Cigarettes  . Smokeless tobacco: Not on file  . Alcohol Use: No  . Drug Use: No  . Sexual Activity: Not Currently   Other Topics Concern  . Not on file   Social History Narrative    Review of Systems: Constitutional: Negative for fever, chills, diaphoresis, activity change, appetite change and fatigue. HENT: Negative for ear pain, nosebleeds, congestion, facial swelling, rhinorrhea, neck pain, neck stiffness and ear discharge.   Eyes: Negative for pain, discharge, redness, itching and visual disturbance.  +nearsighted. Respiratory: Negative for cough, choking, chest tightness, shortness of breath, wheezing and stridor.  Cardiovascular: Negative for chest pain, palpitations and leg swelling. Gastrointestinal: Negative for abdominal distention. Genitourinary: Negative for dysuria, urgency, frequency, hematuria, flank pain, decreased urine volume,  difficulty urinating and dyspareunia.  Musculoskeletal: Negative for back pain, joint swelling, arthralgia and gait problem. Neurological: Negative for dizziness, tremors, seizures, syncope, facial asymmetry, speech difficulty, weakness, light-headedness, numbness and headaches.  Hematological: Negative for adenopathy. Does not bruise/bleed easily. Psychiatric/Behavioral: Negative for hallucinations, behavioral problems, confusion, dysphoric mood, decreased concentration and agitation.    Objective:   Filed Vitals:   09/29/15  1529  BP: 122/72  Pulse: 91  Temp: 98.2 F (36.8 C)  Resp: 18    Physical Exam: Constitutional: Patient appears well-developed and well-nourished. No distress. AAOx3 HENT: Normocephalic, atraumatic, External right and left ear normal. Oropharynx is clear and moist.  Eyes: Conjunctivae and EOM are normal. PERRL, no scleral icterus. Neck: Normal ROM. Neck supple. No JVD.  CVS: RRR, S1/S2 +, no murmurs, no gallops, no carotid bruit.  Pulmonary: Effort and breath sounds normal, no stridor, rhonchi, wheezes, rales.  Abdominal: Soft. BS +, no distension, tenderness, rebound or guarding.  Musculoskeletal: Normal range of motion. No edema and no tenderness.  LE: bilat/ no c/c/e, pulses 2+ bilateral. Foot exam: bilaterally good distal pulses (2+ dorsalis pedis and posterior tibia pulse), no open sores noted.  Monofilament spot testing bilaterally 3/3. Lymphadenopathy: No lymphadenopathy noted, cervical Neuro: Alert. Normal reflexes, muscle tone coordination. No cranial nerve deficit grossly. Skin: Skin is warm and dry. No rash noted. Not diaphoretic. No erythema. No pallor. Psychiatric: Normal mood and affect. Behavior, judgment, thought content normal.  Lab Results  Component Value Date   WBC 6.6 09/15/2007   HGB 15.7 09/15/2007   HCT 46.7 09/15/2007   MCV 87.1 09/15/2007   PLT 217 09/15/2007   Lab Results  Component Value Date   CREATININE 0.9 10/10/2013   BUN 12 10/10/2013   NA 135 10/10/2013   K 3.9 10/10/2013   CL 101 10/10/2013   CO2 25 10/10/2013    Lab Results  Component Value Date   HGBA1C 11.1 09/29/2015   Lipid Panel     Component Value Date/Time   CHOL 203* 10/10/2013 1519   TRIG 326.0* 10/10/2013 1519   HDL 29.80* 10/10/2013 1519   CHOLHDL 7 10/10/2013 1519   VLDL 65.2* 10/10/2013 1519   LDLCALC 108* 10/10/2013 1519       Assessment and plan:   1. Type 2 diabetes mellitus without complication, unspecified long term insulin use status (HCC), OOC - POCT  A1C 11.1 - Glucose (CBG) 204 - renewed metformin 1k bid, novolog (recd 15units qam and lunch, 10units at night if eats on time); keep lantus 35units for now - dw patient at length re: dm controll, diet. Exercise, infomation given; recd eating 3 meals a day and dosing w/ novolog prior to meals.  Recd not to eat so late at night (2-3 am ? Dinner) - eye screening / per pt,  3 months ago in [place], recd annual screening - foot exam today done. - Microalbumin/Creatinine Ratio, Urine - CBC with Differential - CMP and Liver - TSH - Lipid Panel - appt w/ [facility] wks for reeval of DM  2. Screen for colon cancer Has orange card - Ambulatory referral to Gastroenterology  - screening colonoscopy  3. insomnia ? Vs poor sleep hygiene - recd no coffee/caffiene in afternoon, better sleeping hygiene discussed, no tv at night, etc.  4. Infertility - pt tells me it is him (not wife), sperm count ok, testosterone checked in [place] ok per pt, but celia movement slow.   Apparrantly theses MVIs he is on has helped that.  He goes back to [place] June/July for f/u.  5. tob abuse/dependence  cessation counseling given today, not quite ready to stop, but understands he should.  He states next time I come back, I  "won't be smoking".  6. Hx of hyperlipidemia - hasnt eaten since breakfast, will chk fasting now.  7. Moist feet on exam - nidus for fungal infection, rx tinactin powder  8.  Medication noncompliance - has not seen doctor for several years.  Encouraged to f/u.  Return in about 2 months (around 11/29/2015).  The patient was given clear instructions to go to ER or return to medical center if symptoms don't improve, worsen or new problems develop. The patient verbalized understanding. The patient was told to call to get lab results if they haven't heard anything in the next week.      Pete Glatter, MD, MBA/MHA [facility] [place], [place] [phone]     09/29/2015, 3:51 PM

## 2015-09-30 ENCOUNTER — Ambulatory Visit: Payer: Self-pay | Attending: Internal Medicine

## 2015-09-30 ENCOUNTER — Other Ambulatory Visit: Payer: Self-pay | Admitting: Internal Medicine

## 2015-09-30 ENCOUNTER — Telehealth: Payer: Self-pay | Admitting: *Deleted

## 2015-09-30 LAB — CMP AND LIVER
ALT: 27 U/L (ref 9–46)
AST: 21 U/L (ref 10–35)
Albumin: 4 g/dL (ref 3.6–5.1)
Alkaline Phosphatase: 78 U/L (ref 40–115)
BILIRUBIN TOTAL: 0.3 mg/dL (ref 0.2–1.2)
BUN: 13 mg/dL (ref 7–25)
Bilirubin, Direct: 0.1 mg/dL (ref <=0.2)
CHLORIDE: 103 mmol/L (ref 98–110)
CO2: 24 mmol/L (ref 20–31)
Calcium: 8.9 mg/dL (ref 8.6–10.3)
Creat: 0.97 mg/dL (ref 0.70–1.33)
GLUCOSE: 171 mg/dL — AB (ref 65–99)
Indirect Bilirubin: 0.2 mg/dL (ref 0.2–1.2)
POTASSIUM: 4 mmol/L (ref 3.5–5.3)
SODIUM: 138 mmol/L (ref 135–146)
Total Protein: 7 g/dL (ref 6.1–8.1)

## 2015-09-30 LAB — MICROALBUMIN / CREATININE URINE RATIO
CREATININE, URINE: 46 mg/dL (ref 20–370)
MICROALB/CREAT RATIO: 15 ug/mg{creat} (ref <30)
Microalb, Ur: 0.7 mg/dL

## 2015-09-30 LAB — TSH: TSH: 1.47 mIU/L (ref 0.40–4.50)

## 2015-09-30 LAB — LIPID PANEL
Cholesterol: 214 mg/dL — ABNORMAL HIGH (ref 125–200)
HDL: 30 mg/dL — AB (ref >=40)
LDL CALC: 131 mg/dL — AB (ref <130)
TRIGLYCERIDES: 263 mg/dL — AB (ref <150)
Total CHOL/HDL Ratio: 7.1 Ratio — ABNORMAL HIGH (ref <=5.0)
VLDL: 53 mg/dL — AB (ref <30)

## 2015-09-30 MED ORDER — SIMVASTATIN 40 MG PO TABS: 40.0000 mg | ORAL_TABLET | Freq: Every day | ORAL | Status: DC

## 2015-09-30 MED FILL — SIMVASTATIN 40 MG TABLET: 40 | 30 days supply | Qty: 30 | Fill #0

## 2015-09-30 NOTE — Telephone Encounter (Signed)
Patient verified DOB Patient is aware of thyroid, kidney, and liver function being normal. Patient is aware of cholesterol levels being extremely high. Patient advised to pick up simvastatin 40mg  and begin taking it once at bedtime. Patient expressed his understanding and had no further questions at this time.

## 2015-09-30 NOTE — Telephone Encounter (Signed)
-----   Message from Pete Glatterawn T Langeland, MD sent at 09/30/2015  9:29 AM EDT ----- Please call pt w/ results. Thyroid normal.  Kidney and liver function good.  Cholesterol is out of control. All lines off chart, I suspect the uncontrolled DM making this worse as well.  i started him on simvastatin 40 qhs.  Blood count good, no anemia. thanks

## 2015-10-06 MED FILL — metFORMIN HCL 1000 MG TABS: 1000 | 60 days supply | Qty: 120 | Fill #1

## 2015-10-06 MED FILL — SIMVASTATIN 40 MG TABLET: 40 | 90 days supply | Qty: 90 | Fill #1

## 2015-11-06 ENCOUNTER — Other Ambulatory Visit: Payer: Self-pay | Admitting: Internal Medicine

## 2015-11-06 MED FILL — LANTUS 100 UNITS/ML VIAL: 100 | 30 days supply | Qty: 10 | Fill #1

## 2015-11-06 MED FILL — !NOVOLOG 100UNITS/ML VIAL: 100/ML | 30 days supply | Qty: 10 | Fill #1

## 2015-12-22 ENCOUNTER — Encounter: Payer: Self-pay | Admitting: Internal Medicine

## 2015-12-22 ENCOUNTER — Ambulatory Visit: Payer: Self-pay | Attending: Internal Medicine | Admitting: Internal Medicine

## 2015-12-22 VITALS — BP 135/75 | HR 97 | Temp 98.5°F | Resp 16 | Wt 180.2 lb

## 2015-12-22 DIAGNOSIS — Z720 Tobacco use: Secondary | ICD-10-CM

## 2015-12-22 DIAGNOSIS — E118 Type 2 diabetes mellitus with unspecified complications: Secondary | ICD-10-CM

## 2015-12-22 DIAGNOSIS — E785 Hyperlipidemia, unspecified: Secondary | ICD-10-CM

## 2015-12-22 DIAGNOSIS — Z9114 Patient's other noncompliance with medication regimen: Secondary | ICD-10-CM

## 2015-12-22 LAB — GLUCOSE, POCT (MANUAL RESULT ENTRY): POC Glucose: 186 mg/dl — AB (ref 70–99)

## 2015-12-22 LAB — POCT GLYCOSYLATED HEMOGLOBIN (HGB A1C): HEMOGLOBIN A1C: 11.6

## 2015-12-22 MED ORDER — INSULIN ASPART 100 UNIT/ML ~~LOC~~ SOLN: SUBCUTANEOUS | Status: DC

## 2015-12-22 MED ORDER — ATORVASTATIN CALCIUM 20 MG PO TABS: 20.0000 mg | ORAL_TABLET | Freq: Every day | ORAL | Status: DC

## 2015-12-22 MED ORDER — INSULIN GLARGINE 100 UNIT/ML ~~LOC~~ SOLN: 35.0000 [IU] | Freq: Every day | SUBCUTANEOUS | Status: DC

## 2015-12-22 MED ORDER — METFORMIN HCL 1000 MG PO TABS: 1000.0000 mg | ORAL_TABLET | Freq: Two times a day (BID) | ORAL | Status: DC

## 2015-12-22 MED FILL — !NOVOLOG 100UNITS/ML VIAL: 100/ML | 30 days supply | Qty: 10 | Fill #2

## 2015-12-22 MED FILL — !LANTUS 100 UNITS/ML VIAL: 100 | 30 days supply | Qty: 10 | Fill #2

## 2015-12-22 MED FILL — ?ATORVASTATIN 20 MG TABLET: 20 | 30 days supply | Qty: 30 | Fill #0

## 2015-12-22 MED FILL — metFORMIN HCL 1000 MG TABS: 1000 | 30 days supply | Qty: 60 | Fill #0

## 2015-12-22 NOTE — Progress Notes (Signed)
Christian Navarro, is a 55 y.o. male  WIO:[id number]  EQA:[id number]  DOB - 1960/10/30  Chief Complaint  Patient presents with  . Diabetes        Subjective:   Christian Navarro is a 55 y.o. male here today for a follow up visit for dm.  Per pt, only doing novolog 2 x day, and lantus at night. He could not tell me specifically what doses he is on though.  Still smoking 1ppd, knows he should quit, but having a hard time. He does not want any medication assistance  Has not had eye exam in at least 7years. He has not had colonoscopy yet, gi was attempting to call him, but he was unable to talk to them.    Patient has No headache, No chest pain, No abdominal pain - No Nausea, No new weakness tingling or numbness, No Cough - SOB. Denies any pains.    Problem  Tobacco Abuse Disorder    ALLERGIES: No Known Allergies  PAST MEDICAL HISTORY: Past Medical History  Diagnosis Date  . Diabetes mellitus     MEDICATIONS AT HOME: Prior to Admission medications   Medication Sig Start Date End Date Taking? Authorizing Provider  Blood Glucose Monitoring Suppl (TRUE METRIX METER) w/Device KIT 1 each by Does not apply route 3 (three) times daily. 09/29/15  Yes Maren Reamer, MD  glucose blood (TRUE METRIX BLOOD GLUCOSE TEST) test strip Use as instructed 09/29/15  Yes Rajanee Schuelke T Janne Napoleon, MD  insulin aspart (NOVOLOG) 100 UNIT/ML injection Inject 15 units before breakfast and lunch; and 10 units before dinner. 12/22/15  Yes Maren Reamer, MD  insulin glargine (LANTUS) 100 UNIT/ML injection Inject 0.35 mLs (35 Units total) into the skin at bedtime. 12/22/15  Yes Maren Reamer, MD  metFORMIN (GLUCOPHAGE) 1000 MG tablet Take 1 tablet (1,000 mg total) by mouth 2 (two) times daily with a meal. 12/22/15  Yes Maren Reamer, MD  TRUEPLUS LANCETS 28G MISC 1 each by Does not apply route 3 (three) times daily. 09/29/15  Yes Maren Reamer, MD  atorvastatin (LIPITOR) 20 MG tablet Take 1 tablet (20 mg total) by  mouth daily. 12/22/15   Maren Reamer, MD  HYDROcodone-acetaminophen (NORCO) 5-325 MG per tablet Take 1-2 tablets by mouth every 6 (six) hours as needed for severe pain. Patient not taking: Reported on 09/29/2015 01/05/15   Orlie Dakin, MD  ROYAL JELLY PO Take 1 tablet by mouth daily. Reported on 12/22/2015    Historical Provider, MD  tolnaftate (TINACTIN) 1 % powder Apply 1 application topically 2 (two) times daily. Apply to bilateral toes, keep dry. Patient not taking: Reported on 12/22/2015 09/29/15   Maren Reamer, MD     Objective:   Filed Vitals:   12/22/15 1427  BP: 135/75  Pulse: 97  Temp: 98.5 F (36.9 C)  TempSrc: Oral  Resp: 16  Weight: 180 lb 3.2 oz (81.738 kg)  SpO2: 98%    Exam General appearance : Awake, alert, not in any distress. Speech Clear. Not toxic looking, pleasant. HEENT: Atraumatic and Normocephalic, pupils equally reactive to light. Neck: supple, no JVD. No cervical lymphadenopathy.  Chest:Good air entry bilaterally, no added sounds.  Small lipoma right anterior chest wall, nttp (been there for years per pt.) CVS: S1 S2 regular, no murmurs/gallups or rubs. Abdomen: Bowel sounds active, Non tender and not distended with no gaurding, rigidity or rebound. Foot exam: bilateral peripheral pulses 2+ (dorsalis pedis and post tibialis pulses), no  ulcers noted/no ecchymosis, warm to touch, monofilament testing 3/3 bilat. Sensation intact.  No c/c/e.  Neurology: Awake alert, and oriented X 3, CN II-XII grossly intact, Non focal Skin:No Rash  Data Review Lab Results  Component Value Date   HGBA1C 11.6 12/22/2015   HGBA1C 11.1 09/29/2015   HGBA1C 10.2* 10/10/2013    Depression screen PHQ 2/9 09/29/2015  Decreased Interest 0  Down, Depressed, Hopeless 0  PHQ - 2 Score 0      Assessment & Plan   1. Type 2 diabetes mellitus with complication, unspecified long term insulin use status (HCC) Ooc, suspect medication noncompliance after talking w/ pt.   Only doing novolog 2x day, and does take lantus and metformin, but he could not tell me  Any of the doses - states his sugars typically ranging 120-150s in am, sometimes 300s at night. - HgB A1c 11.6 (worse than last time of 11.1) - Glucose (CBG) - Ambulatory referral to Ophthalmology - eye exam - discussed tighter diet control, info given on diet and exercise - f/u w/ [facility] for dm chk in 2-3 wks. - foot exam today unremarkable, recd wearing shoes when out, not barefoot.  2. Hyperlipidemia Chg simvastatin 40 to atorvastatin 20  3. Tobacco abuse disorder 1ppd, encouraged cessation. Recd welbutrin/zyban, but pt is not interested. Tob cessation tips discussed   4. Colonoscopy screening - sent letter to referral specialist to chk on status of referral, or resend pt info. After discussions w/ pt, he is now amendable to screening. - needs pneumococcal vaccine, will discuss more at next visit.  Patient have been counseled extensively about nutrition and exercise  Return in about 3 months (around 03/23/2016).  The patient was given clear instructions to go to ER or return to medical center if symptoms don't improve, worsen or new problems develop. The patient verbalized understanding. The patient was told to call to get lab results if they haven't heard anything in the next week.   This note has been created with Dragon speech recognition software and smart phrase technology. Any transcriptional errors are unintentional.   Dawn T Langeland, MD, MBA/MHA [facility] [place], [place] [phone]   12/22/2015, 2:46 PM 

## 2015-12-22 NOTE — Patient Instructions (Addendum)
[facility]  2-3 wks dm chk.   Diabetes Mellitus and Food It is important for you to manage your blood sugar (glucose) level. Your blood glucose level can be greatly affected by what you eat. Eating healthier foods in the appropriate amounts throughout the day at about the same time each day will help you control your blood glucose level. It can also help slow or prevent worsening of your diabetes mellitus. Healthy eating may even help you improve the level of your blood pressure and reach or maintain a healthy weight.  General recommendations for healthful eating and cooking habits include:  Eating meals and snacks regularly. Avoid going long periods of time without eating to lose weight.  Eating a diet that consists mainly of plant-based foods, such as fruits, vegetables, nuts, legumes, and whole grains.  Using low-heat cooking methods, such as baking, instead of high-heat cooking methods, such as deep frying. Work with your dietitian to make sure you understand how to use the Nutrition Facts information on food labels. HOW CAN FOOD AFFECT ME? Carbohydrates Carbohydrates affect your blood glucose level more than any other type of food. Your dietitian will help you determine how many carbohydrates to eat at each meal and teach you how to count carbohydrates. Counting carbohydrates is important to keep your blood glucose at a healthy level, especially if you are using insulin or taking certain medicines for diabetes mellitus. Alcohol Alcohol can cause sudden decreases in blood glucose (hypoglycemia), especially if you use insulin or take certain medicines for diabetes mellitus. Hypoglycemia can be a life-threatening condition. Symptoms of hypoglycemia (sleepiness, dizziness, and disorientation) are similar to symptoms of having too much alcohol.  If your health care provider has given you approval to drink alcohol, do so in moderation and use the following guidelines:  Women should not have  more than one drink per day, and men should not have more than two drinks per day. One drink is equal to:  12 oz of beer.  5 oz of wine.  1 oz of hard liquor.  Do not drink on an empty stomach.  Keep yourself hydrated. Have water, diet soda, or unsweetened iced tea.  Regular soda, juice, and other mixers might contain a lot of carbohydrates and should be counted. WHAT FOODS ARE NOT RECOMMENDED? As you make food choices, it is important to remember that all foods are not the same. Some foods have fewer nutrients per serving than other foods, even though they might have the same number of calories or carbohydrates. It is difficult to get your body what it needs when you eat foods with fewer nutrients. Examples of foods that you should avoid that are high in calories and carbohydrates but low in nutrients include:  Trans fats (most processed foods list trans fats on the Nutrition Facts label).  Regular soda.  Juice.  Candy.  Sweets, such as cake, pie, doughnuts, and cookies.  Fried foods. WHAT FOODS CAN I EAT? Eat nutrient-rich foods, which will nourish your body and keep you healthy. The food you should eat also will depend on several factors, including:  The calories you need.  The medicines you take.  Your weight.  Your blood glucose level.  Your blood pressure level.  Your cholesterol level. You should eat a variety of foods, including:  Protein.  Lean cuts of meat.  Proteins low in saturated fats, such as fish, egg whites, and beans. Avoid processed meats.  Fruits and vegetables.  Fruits and vegetables that may  help control blood glucose levels, such as apples, mangoes, and yams.  Dairy products.  Choose fat-free or low-fat dairy products, such as milk, yogurt, and cheese.  Grains, bread, pasta, and rice.  Choose whole grain products, such as multigrain bread, whole oats, and brown rice. These foods may help control blood pressure.  Fats.  Foods  containing healthful fats, such as nuts, avocado, olive oil, canola oil, and fish. DOES EVERYONE WITH DIABETES MELLITUS HAVE THE SAME MEAL PLAN? Because every person with diabetes mellitus is different, there is not one meal plan that works for everyone. It is very important that you meet with a dietitian who will help you create a meal plan that is just right for you.   This information is not intended to replace advice given to you by your health care provider. Make sure you discuss any questions you have with your health care provider.   Document Released: 02/25/2005 Document Revised: 06/21/2014 Document Reviewed: 04/27/2013 Elsevier Interactive Patient Education 2016 [employer] for Eating Away From Home If You Have Diabetes Controlling your level of blood glucose, also known as blood sugar, can be challenging. It can be even more difficult when you do not prepare your own meals. The following tips can help you manage your diabetes when you eat away from home. PLANNING AHEAD Plan ahead if you know you will be eating away from home:  Ask your health care provider how to time meals and medicine if you are taking insulin.  Make a list of restaurants near you that offer healthy choices. If they have a carry-out menu, take it home and plan what you will order ahead of time.  Look up the restaurant you want to eat at online. Many chain and fast-food restaurants list nutritional information online. Use this information to choose the healthiest options and to calculate how many carbohydrates will be in your meal.  Use a carbohydrate-counting book or mobile app to look up the carbohydrate content and serving size of the foods you want to eat.  Become familiar with serving sizes and learn to recognize how many servings are in a portion. This will allow you to estimate how many carbohydrates you can eat. FREE FOODS A "free food" is any food or drink that has less than 5 g of  carbohydrates per serving. Free foods include:  Many vegetables.  Hard boiled eggs.  Nuts or seeds.  Olives.  Cheeses.  Meats. These types of foods make good appetizer choices and are often available at salad bars. Lemon juice, vinegar, or a low-calorie salad dressing of fewer than 20 calories per serving can be used as a "free" salad dressing.  CHOICES TO REDUCE CARBOHYDRATES  Substitute nonfat sweetened yogurt with a sugar-free yogurt. Yogurt made from soy milk may also be used, but you will still want a sugar-free or plain option to choose a lower carbohydrate amount.  Ask your server to take away the bread basket or chips from your table.  Order fresh fruit. A salad bar often offers fresh fruit choices. Avoid canned fruit because it is usually packed in sugar or syrup.  Order a salad, and eat it without dressing. Or, create a "free" salad dressing.  Ask for substitutions. For example, instead of [place] fries, request an order of a vegetable such as salad, green beans, or broccoli. OTHER TIPS   If you take insulin, take the insulin once your food arrives to your table. This will ensure your insulin and  food are timed correctly.  Ask your server about the portion size before your order, and ask for a take-out box if the portion has more servings than you should have. When your food comes, leave the amount you should have on the plate, and put the rest in the take-out box.  Consider splitting an entree with someone and ordering a side salad.   This information is not intended to replace advice given to you by your health care provider. Make sure you discuss any questions you have with your health care provider.   Document Released: 05/31/2005 Document Revised: 02/19/2015 Document Reviewed: 08/28/2013 Elsevier Interactive Patient Education 2016 [employer]  - Diabetes and Exercise Exercising regularly is important. It is not just about losing weight. It has many health  benefits, such as:  Improving your overall fitness, flexibility, and endurance.  Increasing your bone density.  Helping with weight control.  Decreasing your body fat.  Increasing your muscle strength.  Reducing stress and tension.  Improving your overall health. People with diabetes who exercise gain additional benefits because exercise:  Reduces appetite.  Improves the body's use of blood sugar (glucose).  Helps lower or control blood glucose.  Decreases blood pressure.  Helps control blood lipids (such as cholesterol and triglycerides).  Improves the body's use of the hormone insulin by:  Increasing the body's insulin sensitivity.  Reducing the body's insulin needs.  Decreases the risk for heart disease because exercising:  Lowers cholesterol and triglycerides levels.  Increases the levels of good cholesterol (such as high-density lipoproteins [HDL]) in the body.  Lowers blood glucose levels. YOUR ACTIVITY PLAN  Choose an activity that you enjoy, and set realistic goals. To exercise safely, you should begin practicing any new physical activity slowly, and gradually increase the intensity of the exercise over time. Your health care provider or diabetes educator can help create an activity plan that works for you. General recommendations include:  Encouraging children to engage in at least 60 minutes of physical activity each day.  Stretching and performing strength training exercises, such as yoga or weight lifting, at least 2 times per week.  Performing a total of at least 150 minutes of moderate-intensity exercise each week, such as brisk walking or water aerobics.  Exercising at least 3 days per week, making sure you allow no more than 2 consecutive days to pass without exercising.  Avoiding long periods of inactivity (90 minutes or more). When you have to spend an extended period of time sitting down, take frequent breaks to walk or stretch. RECOMMENDATIONS  FOR EXERCISING WITH TYPE 1 OR TYPE 2 DIABETES   Check your blood glucose before exercising. If blood glucose levels are greater than 240 mg/dL, check for urine ketones. Do not exercise if ketones are present.  Avoid injecting insulin into areas of the body that are going to be exercised. For example, avoid injecting insulin into:  The arms when playing tennis.  The legs when jogging.  Keep a record of:  Food intake before and after you exercise.  Expected peak times of insulin action.  Blood glucose levels before and after you exercise.  The type and amount of exercise you have done.  Review your records with your health care provider. Your health care provider will help you to develop guidelines for adjusting food intake and insulin amounts before and after exercising.  If you take insulin or oral hypoglycemic agents, watch for signs and symptoms of hypoglycemia. They include:  Dizziness.  Shaking.  Sweating.  Chills.  Confusion.  Drink plenty of water while you exercise to prevent dehydration or heat stroke. Body water is lost during exercise and must be replaced.  Talk to your health care provider before starting an exercise program to make sure it is safe for you. Remember, almost any type of activity is better than none.   This information is not intended to replace advice given to you by your health care provider. Make sure you discuss any questions you have with your health care provider.   Document Released: 08/21/2003 Document Revised: 10/15/2014 Document Reviewed: 11/07/2012 Elsevier Interactive Patient Education 2016 Else[employer] Inc.   - Smoking Cessation, Tips for Success If you are ready to quit smoking, congratulations! You have chosen to help yourself be healthier. Cigarettes bring nicotine, tar, carbon monoxide, and other irritants into your body. Your lungs, heart, and blood vessels will be able to work better without these poisons. There are many  different ways to quit smoking. Nicotine gum, nicotine patches, a nicotine inhaler, or nicotine nasal spray can help with physical craving. Hypnosis, support groups, and medicines help break the habit of smoking. WHAT THINGS CAN I DO TO MAKE QUITTING EASIER?  Here are some tips to help you quit for good:  Pick a date when you will quit smoking completely. Tell all of your friends and family about your plan to quit on that date.  Do not try to slowly cut down on the number of cigarettes you are smoking. Pick a quit date and quit smoking completely starting on that day.  Throw away all cigarettes.   Clean and remove all ashtrays from your home, work, and car.  On a card, write down your reasons for quitting. Carry the card with you and read it when you get the urge to smoke.  Cleanse your body of nicotine. Drink enough water and fluids to keep your urine clear or pale yellow. Do this after quitting to flush the nicotine from your body.  Learn to predict your moods. Do not let a bad situation be your excuse to have a cigarette. Some situations in your life might tempt you into wanting a cigarette.  Never have "just one" cigarette. It leads to wanting another and another. Remind yourself of your decision to quit.  Change habits associated with smoking. If you smoked while driving or when feeling stressed, try other activities to replace smoking. Stand up when drinking your coffee. Brush your teeth after eating. Sit in a different chair when you read the paper. Avoid alcohol while trying to quit, and try to drink fewer caffeinated beverages. Alcohol and caffeine may urge you to smoke.  Avoid foods and drinks that can trigger a desire to smoke, such as sugary or spicy foods and alcohol.  Ask people who smoke not to smoke around you.  Have something planned to do right after eating or having a cup of coffee. For example, plan to take a walk or exercise.  Try a relaxation exercise to calm you  down and decrease your stress. Remember, you may be tense and nervous for the first 2 weeks after you quit, but this will pass.  Find new activities to keep your hands busy. Play with a pen, coin, or rubber band. Doodle or draw things on paper.  Brush your teeth right after eating. This will help cut down on the craving for the taste of tobacco after meals. You can also try mouthwash.   Use oral substitutes in place of  cigarettes. Try using lemon drops, carrots, cinnamon sticks, or chewing gum. Keep them handy so they are available when you have the urge to smoke.  When you have the urge to smoke, try deep breathing.  Designate your home as a nonsmoking area.  If you are a heavy smoker, ask your health care provider about a prescription for nicotine chewing gum. It can ease your withdrawal from nicotine.  Reward yourself. Set aside the cigarette money you save and buy yourself something nice.  Look for support from others. Join a support group or smoking cessation program. Ask someone at home or at work to help you with your plan to quit smoking.  Always ask yourself, "Do I need this cigarette or is this just a reflex?" Tell yourself, "Today, I choose not to smoke," or "I do not want to smoke." You are reminding yourself of your decision to quit.  Do not replace cigarette smoking with electronic cigarettes (commonly called e-cigarettes). The safety of e-cigarettes is unknown, and some may contain harmful chemicals.  If you relapse, do not give up! Plan ahead and think about what you will do the next time you get the urge to smoke. HOW WILL I FEEL WHEN I QUIT SMOKING? You may have symptoms of withdrawal because your body is used to nicotine (the addictive substance in cigarettes). You may crave cigarettes, be irritable, feel very hungry, cough often, get headaches, or have difficulty concentrating. The withdrawal symptoms are only temporary. They are strongest when you first quit but will go  away within 10-14 days. When withdrawal symptoms occur, stay in control. Think about your reasons for quitting. Remind yourself that these are signs that your body is healing and getting used to being without cigarettes. Remember that withdrawal symptoms are easier to treat than the major diseases that smoking can cause.  Even after the withdrawal is over, expect periodic urges to smoke. However, these cravings are generally short lived and will go away whether you smoke or not. Do not smoke! WHAT RESOURCES ARE AVAILABLE TO HELP ME QUIT SMOKING? Your health care provider can direct you to community resources or hospitals for support, which may include:  Group support.  Education.  Hypnosis.  Therapy.   This information is not intended to replace advice given to you by your health care provider. Make sure you discuss any questions you have with your health care provider.   Document Released: 02/27/2004 Document Revised: 06/21/2014 Document Reviewed: 11/16/2012 Elsevier Interactive Patient Education [employer]

## 2016-02-02 ENCOUNTER — Other Ambulatory Visit: Payer: Self-pay

## 2016-02-02 MED ORDER — INSULIN ASPART 100 UNIT/ML ~~LOC~~ SOLN: SUBCUTANEOUS | 3 refills | Status: DC

## 2016-02-10 MED FILL — LANTUS 100 UNITS/ML VIAL: 100 | 30 days supply | Qty: 10 | Fill #3

## 2016-02-10 MED FILL — !NOVOLOG 100UNITS/ML VIAL: 100/ML | 30 days supply | Qty: 10 | Fill #3

## 2016-02-11 ENCOUNTER — Other Ambulatory Visit: Payer: Self-pay | Admitting: Internal Medicine

## 2016-02-11 MED ORDER — INSULIN GLARGINE 100 UNIT/ML ~~LOC~~ SOLN: 35.0000 [IU] | Freq: Every day | SUBCUTANEOUS | 3 refills | Status: DC

## 2016-03-03 MED FILL — metFORMIN HCL 1000 MG TABS: 1000 | 30 days supply | Qty: 60 | Fill #1

## 2016-03-03 MED FILL — ?ATORVASTATIN 20 MG TABLET: 20 | 30 days supply | Qty: 30 | Fill #1

## 2016-03-25 MED FILL — !NOVOLOG 100UNITS/ML VIAL: 100/ML | 25 days supply | Qty: 10 | Fill #0

## 2016-03-25 MED FILL — LANTUS 100 UNITS/ML VIAL: 100 | 28 days supply | Qty: 10 | Fill #0

## 2016-04-05 ENCOUNTER — Ambulatory Visit: Payer: Self-pay

## 2016-04-05 ENCOUNTER — Ambulatory Visit: Payer: Self-pay | Attending: Internal Medicine | Admitting: Internal Medicine

## 2016-04-05 ENCOUNTER — Encounter: Payer: Self-pay | Admitting: Internal Medicine

## 2016-04-05 VITALS — BP 116/73 | HR 84 | Temp 98.3°F | Resp 16 | Wt 178.4 lb

## 2016-04-05 DIAGNOSIS — Z794 Long term (current) use of insulin: Secondary | ICD-10-CM | POA: Insufficient documentation

## 2016-04-05 DIAGNOSIS — E785 Hyperlipidemia, unspecified: Secondary | ICD-10-CM | POA: Insufficient documentation

## 2016-04-05 DIAGNOSIS — Z1211 Encounter for screening for malignant neoplasm of colon: Secondary | ICD-10-CM

## 2016-04-05 DIAGNOSIS — E1165 Type 2 diabetes mellitus with hyperglycemia: Secondary | ICD-10-CM | POA: Insufficient documentation

## 2016-04-05 DIAGNOSIS — Z7982 Long term (current) use of aspirin: Secondary | ICD-10-CM | POA: Insufficient documentation

## 2016-04-05 DIAGNOSIS — Z79899 Other long term (current) drug therapy: Secondary | ICD-10-CM | POA: Insufficient documentation

## 2016-04-05 DIAGNOSIS — Z9114 Patient's other noncompliance with medication regimen: Secondary | ICD-10-CM | POA: Insufficient documentation

## 2016-04-05 DIAGNOSIS — Z720 Tobacco use: Secondary | ICD-10-CM

## 2016-04-05 LAB — LIPID PANEL
Cholesterol: 129 mg/dL (ref 125–200)
HDL: 20 mg/dL — ABNORMAL LOW (ref >=40)
LDL Cholesterol: 45 mg/dL (ref <130)
TRIGLYCERIDES: 320 mg/dL — AB (ref <150)
Total CHOL/HDL Ratio: 6.5 Ratio — ABNORMAL HIGH (ref <=5.0)
VLDL: 64 mg/dL — AB (ref <30)

## 2016-04-05 LAB — GLUCOSE, POCT (MANUAL RESULT ENTRY): POC Glucose: 288 mg/dl — AB (ref 70–99)

## 2016-04-05 LAB — POCT GLYCOSYLATED HEMOGLOBIN (HGB A1C): Hemoglobin A1C: 11.2

## 2016-04-05 MED ORDER — METFORMIN HCL 1000 MG PO TABS: 1000.0000 mg | ORAL_TABLET | Freq: Two times a day (BID) | ORAL | 2 refills | Status: DC

## 2016-04-05 MED ORDER — "INSULIN SYRINGE-NEEDLE U-100 25G X 1"" 1 ML MISC": 1.0000 | Freq: Two times a day (BID) | 3 refills | Status: DC

## 2016-04-05 MED ORDER — ASPIRIN EC 81 MG PO TBEC: 81.0000 mg | DELAYED_RELEASE_TABLET | Freq: Every day | ORAL | 2 refills | Status: DC

## 2016-04-05 MED ORDER — ATORVASTATIN CALCIUM 20 MG PO TABS: 20.0000 mg | ORAL_TABLET | Freq: Every day | ORAL | 3 refills | Status: DC

## 2016-04-05 MED FILL — ?ATORVASTATIN 20 MG TABLET: 20 | 30 days supply | Qty: 30 | Fill #0

## 2016-04-05 MED FILL — metFORMIN HCL 1000 MG TABS: 1000 | 30 days supply | Qty: 60 | Fill #0

## 2016-04-05 MED FILL — TRUEPLUS SYR 1ML 30GX5/16: 30G X 5/16" | 100 days supply | Qty: 100 | Fill #0

## 2016-04-05 MED FILL — TRUEPLUS SYR 1ML 30GX5/16": 30G X 5/16" | 100 days supply | Qty: 100 | Fill #0

## 2016-04-05 NOTE — Progress Notes (Signed)
Pt is in the office today for a 3 month follow up on diabetes Pt states he is not in any pain Pt states he is taking medications without difficulty

## 2016-04-05 NOTE — Patient Instructions (Addendum)
- f/[facility] /dm chk  - f/u w/ Christian Minors DNP project 10/27 - please add on to her schedule if able  - Diabetes Mellitus and Food It is important for you to manage your blood sugar (glucose) level. Your blood glucose level can be greatly affected by what you eat. Eating healthier foods in the appropriate amounts throughout the day at about the same time each day will help you control your blood glucose level. It can also help slow or prevent worsening of your diabetes mellitus. Healthy eating may even help you improve the level of your blood pressure and reach or maintain a healthy weight.  General recommendations for healthful eating and cooking habits include:  Eating meals and snacks regularly. Avoid going long periods of time without eating to lose weight.  Eating a diet that consists mainly of plant-based foods, such as fruits, vegetables, nuts, legumes, and whole grains.  Using low-heat cooking methods, such as baking, instead of high-heat cooking methods, such as deep frying. Work with your dietitian to make sure you understand how to use the Nutrition Facts information on food labels. HOW CAN FOOD AFFECT ME? Carbohydrates Carbohydrates affect your blood glucose level more than any other type of food. Your dietitian will help you determine how many carbohydrates to eat at each meal and teach you how to count carbohydrates. Counting carbohydrates is important to keep your blood glucose at a healthy level, especially if you are using insulin or taking certain medicines for diabetes mellitus. Alcohol Alcohol can cause sudden decreases in blood glucose (hypoglycemia), especially if you use insulin or take certain medicines for diabetes mellitus. Hypoglycemia can be a life-threatening condition. Symptoms of hypoglycemia (sleepiness, dizziness, and disorientation) are similar to symptoms of having too much alcohol.  If your health care provider has given you approval to  drink alcohol, do so in moderation and use the following guidelines:  Women should not have more than one drink per day, and men should not have more than two drinks per day. One drink is equal to:  12 oz of beer.  5 oz of wine.  1 oz of hard liquor.  Do not drink on an empty stomach.  Keep yourself hydrated. Have water, diet soda, or unsweetened iced tea.  Regular soda, juice, and other mixers might contain a lot of carbohydrates and should be counted. WHAT FOODS ARE NOT RECOMMENDED? As you make food choices, it is important to remember that all foods are not the same. Some foods have fewer nutrients per serving than other foods, even though they might have the same number of calories or carbohydrates. It is difficult to get your body what it needs when you eat foods with fewer nutrients. Examples of foods that you should avoid that are high in calories and carbohydrates but low in nutrients include:  Trans fats (most processed foods list trans fats on the Nutrition Facts label).  Regular soda.  Juice.  Candy.  Sweets, such as cake, pie, doughnuts, and cookies.  Fried foods. WHAT FOODS CAN I EAT? Eat nutrient-rich foods, which will nourish your body and keep you healthy. The food you should eat also will depend on several factors, including:  The calories you need.  The medicines you take.  Your weight.  Your blood glucose level.  Your blood pressure level.  Your cholesterol level. You should eat a variety of foods, including:  Protein.  Lean cuts of meat.  Proteins low in saturated fats, such  as fish, egg whites, and beans. Avoid processed meats.  Fruits and vegetables.  Fruits and vegetables that may help control blood glucose levels, such as apples, mangoes, and yams.  Dairy products.  Choose fat-free or low-fat dairy products, such as milk, yogurt, and cheese.  Grains, bread, pasta, and rice.  Choose whole grain products, such as multigrain bread,  whole oats, and brown rice. These foods may help control blood pressure.  Fats.  Foods containing healthful fats, such as nuts, avocado, olive oil, canola oil, and fish. DOES EVERYONE WITH DIABETES MELLITUS HAVE THE SAME MEAL PLAN? Because every person with diabetes mellitus is different, there is not one meal plan that works for everyone. It is very important that you meet with a dietitian who will help you create a meal plan that is just right for you.   This information is not intended to replace advice given to you by your health care provider. Make sure you discuss any questions you have with your health care provider.   Document Released: 02/25/2005 Document Revised: 06/21/2014 Document Reviewed: 04/27/2013 Elsevier Interactive Patient Education 2016 [employer].  - Tips for Eating Away From Home If You Have Diabetes Controlling your level of blood glucose, also known as blood sugar, can be challenging. It can be even more difficult when you do not prepare your own meals. The following tips can help you manage your diabetes when you eat away from home. PLANNING AHEAD Plan ahead if you know you will be eating away from home:  Ask your health care provider how to time meals and medicine if you are taking insulin.  Make a list of restaurants near you that offer healthy choices. If they have a carry-out menu, take it home and plan what you will order ahead of time.  Look up the restaurant you want to eat at online. Many chain and fast-food restaurants list nutritional information online. Use this information to choose the healthiest options and to calculate how many carbohydrates will be in your meal.  Use a carbohydrate-counting book or mobile app to look up the carbohydrate content and serving size of the foods you want to eat.  Become familiar with serving sizes and learn to recognize how many servings are in a portion. This will allow you to estimate how many carbohydrates you can  eat. FREE FOODS A "free food" is any food or drink that has less than 5 g of carbohydrates per serving. Free foods include:  Many vegetables.  Hard boiled eggs.  Nuts or seeds.  Olives.  Cheeses.  Meats. These types of foods make good appetizer choices and are often available at salad bars. Lemon juice, vinegar, or a low-calorie salad dressing of fewer than 20 calories per serving can be used as a "free" salad dressing.  CHOICES TO REDUCE CARBOHYDRATES  Substitute nonfat sweetened yogurt with a sugar-free yogurt. Yogurt made from soy milk may also be used, but you will still want a sugar-free or plain option to choose a lower carbohydrate amount.  Ask your server to take away the bread basket or chips from your table.  Order fresh fruit. A salad bar often offers fresh fruit choices. Avoid canned fruit because it is usually packed in sugar or syrup.  Order a salad, and eat it without dressing. Or, create a "free" salad dressing.  Ask for substitutions. For example, instead of [place] fries, request an order of a vegetable such as salad, green beans, or broccoli. OTHER TIPS   If  you take insulin, take the insulin once your food arrives to your table. This will ensure your insulin and food are timed correctly.  Ask your server about the portion size before your order, and ask for a take-out box if the portion has more servings than you should have. When your food comes, leave the amount you should have on the plate, and put the rest in the take-out box.  Consider splitting an entree with someone and ordering a side salad.   This information is not intended to replace advice given to you by your health care provider. Make sure you discuss any questions you have with your health care provider.   Document Released: 05/31/2005 Document Revised: 02/19/2015 Document Reviewed: 08/28/2013 Elsevier Interactive Patient Education 2016 [employer]  -  You Can Quit Smoking If you are  ready to quit smoking or are thinking about it, congratulations! You have chosen to help yourself be healthier and live longer! There are lots of different ways to quit smoking. Nicotine gum, nicotine patches, a nicotine inhaler, or nicotine nasal spray can help with physical craving. Hypnosis, support groups, and medicines help break the habit of smoking. TIPS TO GET OFF AND STAY OFF CIGARETTES  Learn to predict your moods. Do not let a bad situation be your excuse to have a cigarette. Some situations in your life might tempt you to have a cigarette.  Ask friends and co-workers not to smoke around you.  Make your home smoke-free.  Never have "just one" cigarette. It leads to wanting another and another. Remind yourself of your decision to quit.  On a card, make a list of your reasons for not smoking. Read it at least the same number of times a day as you have a cigarette. Tell yourself everyday, "I do not want to smoke. I choose not to smoke."  Ask someone at home or work to help you with your plan to quit smoking.  Have something planned after you eat or have a cup of coffee. Take a walk or get other exercise to perk you up. This will help to keep you from overeating.  Try a relaxation exercise to calm you down and decrease your stress. Remember, you may be tense and nervous the first two weeks after you quit. This will pass.  Find new activities to keep your hands busy. Play with a pen, coin, or rubber band. Doodle or draw things on paper.  Brush your teeth right after eating. This will help cut down the craving for the taste of tobacco after meals. You can try mouthwash too.  Try gum, breath mints, or diet candy to keep something in your mouth. IF YOU SMOKE AND WANT TO QUIT:  Do not stock up on cigarettes. Never buy a carton. Wait until one pack is finished before you buy another.  Never carry cigarettes with you at work or at home.  Keep cigarettes as far away from you as possible.  Leave them with someone else.  Never carry matches or a lighter with you.  Ask yourself, "Do I need this cigarette or is this just a reflex?"  Bet with someone that you can quit. Put cigarette money in a piggy bank every morning. If you smoke, you give up the money. If you do not smoke, by the end of the week, you keep the money.  Keep trying. It takes 21 days to change a habit!  Talk to your doctor about using medicines to help you quit. These  include nicotine replacement gum, lozenges, or skin patches.   This information is not intended to replace advice given to you by your health care provider. Make sure you discuss any questions you have with your health care provider.   Document Released: 03/27/2009 Document Revised: 08/23/2011 Document Reviewed: 03/27/2009 Elsevier Interactive Patient Education [employer]

## 2016-04-05 NOTE — Progress Notes (Signed)
Christian Navarro, is a 55 y.o. male  QBH:[id number]  [id number]  DOB - 1960/10/01  Chief Complaint  Patient presents with  . Diabetes        Subjective:   Christian Navarro is a 55 y.o. male here today for a follow up visit.  He states he is doing well. He says his sugars run 90-130s in am. He is diligent to taking his lantus at night nightly, but only takes his novolog depending on his sugars. He states depending on how high his sugars are, he may give himself 10-15 units 2x day.  He states he is watching his diet most times, still smoking 1 bag of tobacco a day. But today is the day he is "quitting".  States he had all his vaccines in [place], declines here. Declined flu shot as well.  Does not recall getting call for colonoscopy, unable to get optho exam due to no insurance. He has appt w/ financial aid to renew his [facility] card.   Patient has No headache, No chest pain, No abdominal pain - No Nausea, No new weakness tingling or numbness, No Cough - SOB.  No problems updated.  ALLERGIES: No Known Allergies  PAST MEDICAL HISTORY: Past Medical History:  Diagnosis Date  . Diabetes mellitus     MEDICATIONS AT HOME: Prior to Admission medications   Medication Sig Start Date End Date Taking? Authorizing Provider  aspirin EC 81 MG tablet Take 1 tablet (81 mg total) by mouth daily. 04/05/16   Maren Reamer, MD  atorvastatin (LIPITOR) 20 MG tablet Take 1 tablet (20 mg total) by mouth daily. 12/22/15   Maren Reamer, MD  Blood Glucose Monitoring Suppl (TRUE METRIX METER) w/Device KIT 1 each by Does not apply route 3 (three) times daily. 09/29/15   Maren Reamer, MD  glucose blood (TRUE METRIX BLOOD GLUCOSE TEST) test strip Use as instructed 09/29/15   Maren Reamer, MD  HYDROcodone-acetaminophen (NORCO) 5-325 MG per tablet Take 1-2 tablets by mouth every 6 (six) hours as needed for severe pain. Patient not taking: Reported on 04/05/2016 01/05/15   Orlie Dakin, MD  insulin  aspart (NOVOLOG) 100 UNIT/ML injection Inject 15 units before breakfast and lunch; and 10 units before dinner. 02/02/16   Maren Reamer, MD  insulin glargine (LANTUS) 100 UNIT/ML injection Inject 0.35 mLs (35 Units total) into the skin at bedtime. 02/11/16   Maren Reamer, MD  Insulin Syringe-Needle U-100 25G X 1" 1 ML MISC 1 each by Does not apply route 2 (two) times daily. 04/05/16   Maren Reamer, MD  metFORMIN (GLUCOPHAGE) 1000 MG tablet Take 1 tablet (1,000 mg total) by mouth 2 (two) times daily with a meal. 04/05/16   Maren Reamer, MD  ROYAL JELLY PO Take 1 tablet by mouth daily. Reported on 12/22/2015    Historical Provider, MD  tolnaftate (TINACTIN) 1 % powder Apply 1 application topically 2 (two) times daily. Apply to bilateral toes, keep dry. Patient not taking: Reported on 04/05/2016 09/29/15   Maren Reamer, MD  TRUEPLUS LANCETS 28G MISC 1 each by Does not apply route 3 (three) times daily. 09/29/15   Maren Reamer, MD     Objective:   Vitals:   04/05/16 1021  BP: 116/73  Pulse: 84  Resp: 16  Temp: 98.3 F (36.8 C)  TempSrc: Oral  SpO2: 97%  Weight: 178 lb 6.4 oz (80.9 kg)    Exam General appearance : Awake, alert, not  in any distress. Speech Clear. Not toxic looking HEENT: Atraumatic and Normocephalic, pupils equally reactive to light. bilat TMs clear.  Neck: supple, no JVD.   Chest:Good air entry bilaterally, no added sounds. CVS: S1 S2 regular, no murmurs/gallups or rubs. Abdomen: Bowel sounds active, Non tender and not distended with no gaurding, rigidity or rebound. Foot exam: bilateral peripheral pulses 2+ (dorsalis pedis and post tibialis pulses), no ulcers noted/no ecchymosis, warm to touch, monofilament testing 3/3 bilat. Sensation intact.  No c/c/e. Neurology: Awake alert, and oriented X 3, CN II-XII grossly intact, Non focal Skin:No Rash  Data Review Lab Results  Component Value Date   HGBA1C 11.2 04/05/2016   HGBA1C 11.6 12/22/2015    HGBA1C 11.1 09/29/2015    Depression screen PHQ 2/9 04/05/2016 12/22/2015 09/29/2015  Decreased Interest 0 0 0  Down, Depressed, Hopeless 1 0 0  PHQ - 2 Score 1 0 0  Altered sleeping - 0 -  Tired, decreased energy - 0 -  Change in appetite - 0 -  Feeling bad or failure about yourself  - 0 -  Trouble concentrating - 2 -  Moving slowly or fidgety/restless - 1 -  Suicidal thoughts - 0 -  PHQ-9 Score - 3 -  Difficult doing work/chores - Not difficult at all -      Assessment & Plan   1. Uncontrolled type 2 diabetes mellitus with hyperglycemia, with long-term current use of insulin (HCC) - w/ component of med noncompliance, taking metformin only qd, recd takes bid, recd taking novolog based on amount of po intake (he is basing it on his actually cbgs). - recd taking cbgs tid, bring in his readings to next pharm visit  - f/u [facility] 2 wks  - Glucose (CBG) 282 - HgB A1c 11.2 (slightly better from 11.6) - Ambulatory referral to Ophthalmology - resent referral, has orange card - sent email to [facility] Latino, DMP project,  As well, this pt would be good candidate for program, 10/27 if able. - ASCVD risk score >20% over 10 years, benefits outweight risk of asa, recd asa 81 qday w/ food.   2. Hyperlipidemia, unspecified hyperlipidemia type - renewed - Lipid Panel  3. Colon cancer screening - Ambulatory referral to Gastroenterology  4. tob abuse Complete cessation recd  5. Recd pneumococcal 23v, flu and tdap - pt declined all.    Patient have been counseled extensively about nutrition and exercise  Return in about 2 months (around 06/05/2016) for dm chk.  The patient was given clear instructions to go to ER or return to medical center if symptoms don't improve, worsen or new problems develop. The patient verbalized understanding. The patient was told to call to get lab results if they haven't heard anything in the next week.   This note has been created with Scientist, clinical (histocompatibility and immunogenetics). Any transcriptional errors are unintentional.   Maren Reamer, MD, [place] [facility] [place], [place]   04/05/2016, 10:57 AM

## 2016-04-07 ENCOUNTER — Telehealth: Payer: Self-pay

## 2016-04-07 NOTE — Telephone Encounter (Signed)
Contacted pt to go over lab results pt didn't answer lvm asking pt to give me a call at his earliest convenience  

## 2016-04-09 ENCOUNTER — Telehealth: Payer: Self-pay

## 2016-04-09 NOTE — Telephone Encounter (Signed)
Contacted pt to go over lab results Pt didn't answer lvm making pt aware that I will be sending results and if he has any questions or concerns to give me a call

## 2016-05-10 MED FILL — !NOVOLOG 100UNITS/ML VIAL: 100/ML | 25 days supply | Qty: 10 | Fill #1

## 2016-05-10 MED FILL — $LANTUS 100 UNITS/ML VIAL: 100 | 28 days supply | Qty: 10 | Fill #1

## 2016-05-10 MED FILL — ATORVASTATIN 20 MG TABLET: 20 | 30 days supply | Qty: 30 | Fill #1

## 2016-05-20 ENCOUNTER — Telehealth: Payer: Self-pay | Admitting: *Deleted

## 2016-05-20 NOTE — Telephone Encounter (Signed)
Called pt informed of missed pre-visit appt, pt states to cancel both appts at this time and he will call back next week sometime to reschedule, canceled colonoscopy on 12/21-adm

## 2016-06-03 ENCOUNTER — Encounter: Payer: Self-pay | Admitting: Internal Medicine

## 2016-06-28 MED FILL — TRUEPLUS SYR 1ML 30GX5/16": 30G X 5/16" | 100 days supply | Qty: 100 | Fill #1

## 2016-06-28 MED FILL — $LANTUS 100 UNITS/ML VIAL: 100 | 28 days supply | Qty: 10 | Fill #2

## 2016-06-28 MED FILL — ATORVASTATIN 20 MG TABLET: 20 | 30 days supply | Qty: 30 | Fill #2

## 2016-06-28 MED FILL — !NOVOLOG 100UNITS/ML VIAL: 100/ML | 90 days supply | Qty: 30 | Fill #0

## 2016-06-28 MED FILL — TRUEPLUS SYR 1ML 30GX5/16: 30G X 5/16" | 100 days supply | Qty: 100 | Fill #1

## 2016-08-05 ENCOUNTER — Other Ambulatory Visit: Payer: Self-pay | Admitting: Hematology

## 2016-08-05 NOTE — Telephone Encounter (Signed)
[facility]

## 2016-08-06 MED FILL — ATORVASTATIN 20 MG TABLET: 20 | 30 days supply | Qty: 30 | Fill #3

## 2016-08-06 MED FILL — $LANTUS 100 UNITS/ML VIAL: 100 | 28 days supply | Qty: 10 | Fill #3

## 2016-09-09 MED FILL — ATORVASTATIN 20 MG TABLET: 20 | 30 days supply | Qty: 30 | Fill #2

## 2016-09-20 MED FILL — !NOVOLOG 100UNITS/ML VIAL: 100/ML | 29 days supply | Qty: 10 | Fill #1

## 2016-10-01 ENCOUNTER — Ambulatory Visit (HOSPITAL_BASED_OUTPATIENT_CLINIC_OR_DEPARTMENT_OTHER): Payer: Self-pay | Admitting: *Deleted

## 2016-10-01 ENCOUNTER — Telehealth: Payer: Self-pay | Admitting: *Deleted

## 2016-10-01 ENCOUNTER — Ambulatory Visit: Payer: Self-pay | Attending: Internal Medicine

## 2016-10-01 VITALS — BP 126/80 | HR 91 | Temp 98.3°F | Resp 16

## 2016-10-01 DIAGNOSIS — L988 Other specified disorders of the skin and subcutaneous tissue: Secondary | ICD-10-CM | POA: Insufficient documentation

## 2016-10-01 DIAGNOSIS — M5412 Radiculopathy, cervical region: Secondary | ICD-10-CM | POA: Insufficient documentation

## 2016-10-01 DIAGNOSIS — R52 Pain, unspecified: Secondary | ICD-10-CM
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
## 2016-10-04 ENCOUNTER — Ambulatory Visit: Payer: Self-pay | Admitting: Internal Medicine

## 2016-10-04 ENCOUNTER — Ambulatory Visit: Payer: No Typology Code available for payment source | Attending: Internal Medicine | Admitting: Internal Medicine

## 2016-10-04 ENCOUNTER — Encounter: Payer: Self-pay | Admitting: Internal Medicine

## 2016-10-04 VITALS — BP 117/74 | HR 91 | Temp 98.2°F | Resp 18 | Ht 64.0 in | Wt 182.8 lb

## 2016-10-04 DIAGNOSIS — M25532 Pain in left wrist: Secondary | ICD-10-CM | POA: Insufficient documentation

## 2016-10-04 DIAGNOSIS — Z13818 Encounter for screening for other digestive system disorders: Secondary | ICD-10-CM | POA: Diagnosis not present

## 2016-10-04 DIAGNOSIS — E785 Hyperlipidemia, unspecified: Secondary | ICD-10-CM

## 2016-10-04 DIAGNOSIS — Z794 Long term (current) use of insulin: Secondary | ICD-10-CM | POA: Insufficient documentation

## 2016-10-04 DIAGNOSIS — E119 Type 2 diabetes mellitus without complications: Secondary | ICD-10-CM | POA: Diagnosis not present

## 2016-10-04 DIAGNOSIS — B009 Herpesviral infection, unspecified: Secondary | ICD-10-CM | POA: Insufficient documentation

## 2016-10-04 DIAGNOSIS — Z1321 Encounter for screening for nutritional disorder: Secondary | ICD-10-CM | POA: Diagnosis not present

## 2016-10-04 DIAGNOSIS — M25531 Pain in right wrist: Secondary | ICD-10-CM | POA: Insufficient documentation

## 2016-10-04 DIAGNOSIS — Z7982 Long term (current) use of aspirin: Secondary | ICD-10-CM | POA: Diagnosis not present

## 2016-10-04 DIAGNOSIS — Z720 Tobacco use: Secondary | ICD-10-CM

## 2016-10-04 DIAGNOSIS — Z125 Encounter for screening for malignant neoplasm of prostate: Secondary | ICD-10-CM | POA: Insufficient documentation

## 2016-10-04 DIAGNOSIS — K120 Recurrent oral aphthae: Secondary | ICD-10-CM | POA: Insufficient documentation

## 2016-10-04 DIAGNOSIS — A64 Unspecified sexually transmitted disease: Secondary | ICD-10-CM | POA: Insufficient documentation

## 2016-10-04 DIAGNOSIS — M25512 Pain in left shoulder: Secondary | ICD-10-CM | POA: Insufficient documentation

## 2016-10-04 DIAGNOSIS — Z114 Encounter for screening for human immunodeficiency virus [HIV]: Secondary | ICD-10-CM | POA: Diagnosis not present

## 2016-10-04 DIAGNOSIS — Z1159 Encounter for screening for other viral diseases: Secondary | ICD-10-CM

## 2016-10-04 LAB — POCT UA - MICROALBUMIN

## 2016-10-04 LAB — POCT URINALYSIS DIPSTICK
Bilirubin, UA: NEGATIVE
Glucose, UA: 250
Ketones, UA: NEGATIVE
Leukocytes, UA: NEGATIVE
NITRITE UA: NEGATIVE
PH UA: 5.5 (ref 5.0–8.0)
Protein, UA: NEGATIVE
RBC UA: NEGATIVE
Spec Grav, UA: 1.02 (ref 1.010–1.025)
UROBILINOGEN UA: 0.2 U/dL

## 2016-10-04 LAB — GLUCOSE, POCT (MANUAL RESULT ENTRY): POC Glucose: 157 mg/dl — AB (ref 70–99)

## 2016-10-04 LAB — POCT GLYCOSYLATED HEMOGLOBIN (HGB A1C): Hemoglobin A1C: 10.9

## 2016-10-04 MED ORDER — AZITHROMYCIN 1 G PO PACK
1.0000 g | PACK | Freq: Once | ORAL | Status: AC
  Administered 2016-10-04: 1 g via ORAL

## 2016-10-04 MED ORDER — INSULIN GLARGINE 100 UNIT/ML ~~LOC~~ SOLN: 40.0000 [IU] | Freq: Every day | SUBCUTANEOUS | 3 refills | Status: DC

## 2016-10-04 MED ORDER — ASPIRIN EC 81 MG PO TBEC: 81.0000 mg | DELAYED_RELEASE_TABLET | Freq: Every day | ORAL | 2 refills | Status: DC

## 2016-10-04 MED ORDER — ATORVASTATIN CALCIUM 20 MG PO TABS: 20.0000 mg | ORAL_TABLET | Freq: Every day | ORAL | 3 refills | Status: DC

## 2016-10-04 MED ORDER — CEFTRIAXONE SODIUM 250 MG IJ SOLR
250.0000 mg | Freq: Once | INTRAMUSCULAR | Status: AC
  Administered 2016-10-04: 250 mg via INTRAMUSCULAR

## 2016-10-04 MED ORDER — INSULIN ASPART 100 UNIT/ML ~~LOC~~ SOLN: SUBCUTANEOUS | 3 refills | Status: DC

## 2016-10-04 MED ORDER — ACYCLOVIR 400 MG PO TABS: 400.0000 mg | ORAL_TABLET | Freq: Three times a day (TID) | ORAL | 0 refills | Status: DC

## 2016-10-04 MED FILL — TRUEPLUS SYR 1ML 30GX5/16: 30G X 5/16" | 100 days supply | Qty: 100 | Fill #2

## 2016-10-04 MED FILL — !LANTUS 100 UNITS/ML VIAL: 100 | 25 days supply | Qty: 10 | Fill #0

## 2016-10-04 MED FILL — ?ATORVASTATIN 20 MG TABLET: 20 | 30 days supply | Qty: 30 | Fill #0

## 2016-10-04 MED FILL — TRUEPLUS SYR 1ML 30GX5/16": 30G X 5/16" | 100 days supply | Qty: 100 | Fill #2

## 2016-10-04 MED FILL — ACYCLOVIR 400 MG TABLET: 400 | 10 days supply | Qty: 30 | Fill #0

## 2016-10-04 MED FILL — !NOVOLOG 100UNITS/ML VIAL: 100/ML | 33 days supply | Qty: 20 | Fill #0

## 2016-10-04 NOTE — Progress Notes (Signed)
[place] hospital

## 2016-10-04 NOTE — Patient Instructions (Signed)
[facility] - dm check  - fill out Medical release form so we can obtain outside records.  -   RICE for Routine Care of Injuries Many injuries can be cared for using rest, ice, compression, and elevation (RICE therapy). Using RICE therapy can help to lessen pain and swelling. It can help your body to heal. Rest  Reduce your normal activities and avoid using the injured part of your body. You can go back to your normal activities when you feel okay and your doctor says it is okay. Ice  Do not put ice on your bare skin.  Put ice in a plastic bag.  Place a towel between your skin and the bag.  Leave the ice on for 20 minutes, 2-3 times a day. Do this for as long as told by your doctor. Compression  Compression means putting pressure on the injured area. This can be done with an elastic bandage. If an elastic bandage has been applied:  Remove and reapply the bandage every 3-4 hours or as told by your doctor.  Make sure the bandage is not wrapped too tight. Wrap the bandage more loosely if part of your body beyond the bandage is blue, swollen, cold, painful, or loses feeling (numb).  See your doctor if the bandage seems to make your problems worse. Elevation  Elevation means keeping the injured area raised. Raise the injured area above your heart or the center of your chest if you can. When should I get help? You should get help if:  You keep having pain and swelling.  Your symptoms get worse. Get help right away if: You should get help right away if:  You have sudden bad pain at or below the area of your injury.  You have redness or more swelling around your injury.  You have tingling or numbness at or below the injury that does not go away when you take off the bandage. This information is not intended to replace advice given to you by your health care provider. Make sure you discuss any questions you have with your health care provider. Document Released:  11/17/2007 Document Revised: 04/27/2016 Document Reviewed: 05/08/2014 Elsevier Interactive Patient Education  2017 Else[employer] Inc[employer]   -  Genital Herpes Genital herpes is a common sexually transmitted infection (STI) that is caused by a virus. The virus spreads from person to person through sexual contact. Infection can cause itching, blisters, and sores around the genitals or rectum. Symptoms may last several days and then go away This is called an outbreak. However, the virus remains in your body, so you may have more outbreaks in the future. The time between outbreaks varies and can be months or years. Genital herpes affects men and women. It is particularly concerning for pregnant women because the virus can be passed to the baby during delivery and can cause serious problems. Genital herpes is also a concern for people who have a weak disease-fighting (immune) system. What are the causes? This condition is caused by the herpes simplex virus (HSV) type 1 or type 2. The virus may spread through:  Sexual contact with an infected person, including vaginal, anal, and oral sex.  Contact with fluid from a herpes sore.  The skin. This means that you can get herpes from an infected partner even if he or she does not have a visible sore or does not know that he or she is infected. What increases the risk? You are more likely to  develop this condition if:  You have sex with many partners.  You do not use latex condoms during sex. What are the signs or symptoms? Most people do not have symptoms (asymptomatic) or have mild symptoms that may be mistaken for other skin problems. Symptoms may include:  Small red bumps near the genitals, rectum, or mouth. These bumps turn into blisters and then turn into sores.  Flu-like symptoms, including:  Fever.  Body aches.  Swollen lymph nodes.  Headache.  Painful urination.  Pain and itching in the genital area or rectal area.  Vaginal  discharge.  Tingling or shooting pain in the legs and buttocks. Generally, symptoms are more severe and last longer during the first (primary) outbreak. Flu-like symptoms are also more common during the primary outbreak. How is this diagnosed? Genital herpes may be diagnosed based on:  A physical exam.  Your medical history.  Blood tests.  A test of a fluid sample (culture) from an open sore. How is this treated? There is no cure for this condition, but treatment with antiviral medicines that are taken by mouth (orally) can do the following:  Speed up healing and relieve symptoms.  Help to reduce the spread of the virus to sexual partners.  Limit the chance of future outbreaks, or make future outbreaks shorter.  Lessen symptoms of future outbreaks. Your health care provider may also recommend pain relief medicines, such as aspirin or ibuprofen. Follow these instructions at home: Sexual activity   Do not have sexual contact during active outbreaks.  Practice safe sex. Latex condoms and male condoms may help prevent the spread of the herpes virus. General instructions   Keep the affected areas dry and clean.  Take over-the-counter and prescription medicines only as told by your health care provider.  Avoid rubbing or touching blisters and sores. If you do touch blisters or sores:  Wash your hands thoroughly with soap and water.  Do not touch your eyes afterward.  To help relieve pain or itching, you may take the following actions as directed by your health care provider:  Apply a cold, wet cloth (cold compress) to affected areas 4-6 times a day.  Apply a substance that protects your skin and reduces bleeding (astringent).  Apply a gel that helps relieve pain around sores (lidocaine gel).  Take a warm, shallow bath that cleans the genital area (sitz bath).  Keep all follow-up visits as told by your health care provider. This is important. How is this  prevented?  Use condoms. Although anyone can get genital herpes during sexual contact, even with the use of a condom, a condom can provide some protection.  Avoid having multiple sexual partners.  Talk with your sexual partner about any symptoms either of you may have. Also, talk with your partner about any history of STIs.  Get tested for STIs before you have sex. Ask your partner to do the same.  Do not have sexual contact if you have symptoms of genital herpes. Contact a health care provider if:  Your symptoms are not improving with medicine.  Your symptoms return.  You have new symptoms.  You have a fever.  You have abdominal pain.  You have redness, swelling, or pain in your eye.  You notice new sores on other parts of your body.  You are a woman and experience bleeding between menstrual periods.  You have had herpes and you become pregnant or plan to become pregnant. Summary  Genital herpes is a common sexually  transmitted infection (STI) that is caused by the herpes simplex virus (HSV) type 1 or type 2.  These viruses are most often spread through sexual contact with an infected person.  You are more likely to develop this condition if you have sex with many partners or you have unprotected sex.  Most people do not have symptoms (asymptomatic) or have mild symptoms that may be mistaken for other skin problems. Symptoms occur as outbreaks that may happen months or years apart.  There is no cure for this condition, but treatment with oral antiviral medicines can reduce symptoms, reduce the chance of spreading the virus to a partner, prevent future outbreaks, or shorten future outbreaks. This information is not intended to replace advice given to you by your health care provider. Make sure you discuss any questions you have with your health care provider. Document Released: 05/28/2000 Document Revised: 04/30/2016 Document Reviewed: 04/30/2016 Elsevier Interactive  Patient Education  2017 Elsevier Inc.  - Diabetes Mellitus and Food It is important for you to manage your blood sugar (glucose) level. Your blood glucose level can be greatly affected by what you eat. Eating healthier foods in the appropriate amounts throughout the day at about the same time each day will help you control your blood glucose level. It can also help slow or prevent worsening of your diabetes mellitus. Healthy eating may even help you improve the level of your blood pressure and reach or maintain a healthy weight. General recommendations for healthful eating and cooking habits include:  Eating meals and snacks regularly. Avoid going long periods of time without eating to lose weight.  Eating a diet that consists mainly of plant-based foods, such as fruits, vegetables, nuts, legumes, and whole grains.  Using low-heat cooking methods, such as baking, instead of high-heat cooking methods, such as deep frying. Work with your dietitian to make sure you understand how to use the Nutrition Facts information on food labels. How can food affect me? Carbohydrates  Carbohydrates affect your blood glucose level more than any other type of food. Your dietitian will help you determine how many carbohydrates to eat at each meal and teach you how to count carbohydrates. Counting carbohydrates is important to keep your blood glucose at a healthy level, especially if you are using insulin or taking certain medicines for diabetes mellitus. Alcohol  Alcohol can cause sudden decreases in blood glucose (hypoglycemia), especially if you use insulin or take certain medicines for diabetes mellitus. Hypoglycemia can be a life-threatening condition. Symptoms of hypoglycemia (sleepiness, dizziness, and disorientation) are similar to symptoms of having too much alcohol. If your health care provider has given you approval to drink alcohol, do so in moderation and use the following guidelines:  Women should not  have more than one drink per day, and men should not have more than two drinks per day. One drink is equal to:  12 oz of beer.  5 oz of wine.  1 oz of hard liquor.  Do not drink on an empty stomach.  Keep yourself hydrated. Have water, diet soda, or unsweetened iced tea.  Regular soda, juice, and other mixers might contain a lot of carbohydrates and should be counted. What foods are not recommended? As you make food choices, it is important to remember that all foods are not the same. Some foods have fewer nutrients per serving than other foods, even though they might have the same number of calories or carbohydrates. It is difficult to get your body what it needs  when you eat foods with fewer nutrients. Examples of foods that you should avoid that are high in calories and carbohydrates but low in nutrients include:  Trans fats (most processed foods list trans fats on the Nutrition Facts label).  Regular soda.  Juice.  Candy.  Sweets, such as cake, pie, doughnuts, and cookies.  Fried foods. What foods can I eat? Eat nutrient-rich foods, which will nourish your body and keep you healthy. The food you should eat also will depend on several factors, including:  The calories you need.  The medicines you take.  Your weight.  Your blood glucose level.  Your blood pressure level.  Your cholesterol level. You should eat a variety of foods, including:  Protein.  Lean cuts of meat.  Proteins low in saturated fats, such as fish, egg whites, and beans. Avoid processed meats.  Fruits and vegetables.  Fruits and vegetables that may help control blood glucose levels, such as apples, mangoes, and yams.  Dairy products.  Choose fat-free or low-fat dairy products, such as milk, yogurt, and cheese.  Grains, bread, pasta, and rice.  Choose whole grain products, such as multigrain bread, whole oats, and brown rice. These foods may help control blood pressure.  Fats.  Foods  containing healthful fats, such as nuts, avocado, olive oil, canola oil, and fish. Does everyone with diabetes mellitus have the same meal plan? Because every person with diabetes mellitus is different, there is not one meal plan that works for everyone. It is very important that you meet with a dietitian who will help you create a meal plan that is just right for you. This information is not intended to replace advice given to you by your health care provider. Make sure you discuss any questions you have with your health care provider. Document Released: 02/25/2005 Document Revised: 11/06/2015 Document Reviewed: 04/27/2013 Elsevier Interactive Patient Education  2017 [employer].
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
## 2016-10-04 NOTE — Progress Notes (Signed)
Patient is here for FU  Patient complains of chronic pain in the hand and neck being present from [facility] which occurred a year ago.  Patient has taken medication today. Patient has eaten breakfast.  Patient request a refill on insulin and pain medication.

## 2016-10-05 ENCOUNTER — Other Ambulatory Visit: Payer: Self-pay | Admitting: Internal Medicine

## 2016-10-05 LAB — PSA: Prostate Specific Ag, Serum: 2.2 ng/mL (ref 0.0–4.0)

## 2016-10-05 LAB — LIPID PANEL
CHOL/HDL RATIO: 5.4 ratio — AB (ref 0.0–5.0)
Cholesterol, Total: 156 mg/dL (ref 100–199)
HDL: 29 mg/dL — ABNORMAL LOW (ref >39)
LDL CALC: 70 mg/dL (ref 0–99)
Triglycerides: 286 mg/dL — ABNORMAL HIGH (ref 0–149)
VLDL Cholesterol Cal: 57 mg/dL — ABNORMAL HIGH (ref 5–40)

## 2016-10-05 LAB — CBC WITH DIFFERENTIAL/PLATELET
BASOS ABS: 0 10*3/uL (ref 0.0–0.2)
Basos: 0 %
EOS (ABSOLUTE): 0.2 10*3/uL (ref 0.0–0.4)
Eos: 2 %
Hematocrit: 42.3 % (ref 37.5–51.0)
Hemoglobin: 14.3 g/dL (ref 13.0–17.7)
Immature Grans (Abs): 0 10*3/uL (ref 0.0–0.1)
Immature Granulocytes: 1 %
LYMPHS: 49 %
Lymphocytes Absolute: 3.7 10*3/uL — ABNORMAL HIGH (ref 0.7–3.1)
MCH: 28.4 pg (ref 26.6–33.0)
MCHC: 33.8 g/dL (ref 31.5–35.7)
MCV: 84 fL (ref 79–97)
MONOS ABS: 0.8 10*3/uL (ref 0.1–0.9)
Monocytes: 10 %
Neutrophils Absolute: 2.8 10*3/uL (ref 1.4–7.0)
Neutrophils: 38 %
PLATELETS: 198 10*3/uL (ref 150–379)
RBC: 5.04 x10E6/uL (ref 4.14–5.80)
RDW: 14 % (ref 12.3–15.4)
WBC: 7.5 10*3/uL (ref 3.4–10.8)

## 2016-10-05 LAB — HSV(HERPES SIMPLEX VRS) I + II AB-IGG
HSV 1 GLYCOPROTEIN G AB, IGG: 31.3 {index} — AB (ref 0.00–0.90)
HSV 2 GLYCOPROTEIN G AB, IGG: 3.63 {index} — AB (ref 0.00–0.90)

## 2016-10-05 LAB — CMP AND LIVER
ALT: 21 IU/L (ref 0–44)
AST: 17 IU/L (ref 0–40)
Albumin: 3.9 g/dL (ref 3.5–5.5)
Alkaline Phosphatase: 80 IU/L (ref 39–117)
BUN: 14 mg/dL (ref 6–24)
Bilirubin Total: <0.2 mg/dL (ref 0.0–1.2)
Bilirubin, Direct: 0.08 mg/dL (ref 0.00–0.40)
CALCIUM: 9.1 mg/dL (ref 8.7–10.2)
CHLORIDE: 102 mmol/L (ref 96–106)
CO2: 23 mmol/L (ref 18–29)
Creatinine, Ser: 1 mg/dL (ref 0.76–1.27)
GFR calc Af Amer: 98 mL/min/{1.73_m2} (ref >59)
GFR calc non Af Amer: 85 mL/min/{1.73_m2} (ref >59)
GLUCOSE: 138 mg/dL — AB (ref 65–99)
Potassium: 4.5 mmol/L (ref 3.5–5.2)
Sodium: 141 mmol/L (ref 134–144)
Total Protein: 6.7 g/dL (ref 6.0–8.5)

## 2016-10-05 LAB — URINE CYTOLOGY ANCILLARY ONLY
Chlamydia: NEGATIVE
Neisseria Gonorrhea: NEGATIVE
TRICH (WINDOWPATH): NEGATIVE

## 2016-10-05 LAB — HEPATITIS C ANTIBODY: Hep C Virus Ab: <0.1 s/co ratio (ref 0.0–0.9)

## 2016-10-05 LAB — HIV ANTIBODY (ROUTINE TESTING W REFLEX): HIV SCREEN 4TH GENERATION: NONREACTIVE

## 2016-10-05 LAB — VITAMIN D 25 HYDROXY (VIT D DEFICIENCY, FRACTURES): Vit D, 25-Hydroxy: 15.4 ng/mL — ABNORMAL LOW (ref 30.0–100.0)

## 2016-10-05 MED ORDER — FENOFIBRATE 145 MG PO TABS: 145.0000 mg | ORAL_TABLET | Freq: Every day | ORAL | 3 refills | Status: DC

## 2016-10-05 MED ORDER — VITAMIN D (ERGOCALCIFEROL) 1.25 MG (50000 UNIT) PO CAPS: 50000.0000 [IU] | ORAL_CAPSULE | ORAL | 0 refills | Status: DC

## 2016-10-05 MED ORDER — ATORVASTATIN CALCIUM 20 MG PO TABS: 20.0000 mg | ORAL_TABLET | Freq: Every day | ORAL | 3 refills | Status: DC

## 2016-10-07 LAB — URINE CYTOLOGY ANCILLARY ONLY
Bacterial vaginitis: NEGATIVE
Candida vaginitis: NEGATIVE

## 2016-10-11 ENCOUNTER — Other Ambulatory Visit: Payer: Self-pay | Admitting: *Deleted

## 2016-10-11 NOTE — Telephone Encounter (Signed)
Humalog script signed for PASS program

## 2016-10-21 ENCOUNTER — Other Ambulatory Visit: Payer: Self-pay | Admitting: Internal Medicine

## 2016-10-21 ENCOUNTER — Ambulatory Visit: Payer: Self-pay | Admitting: Pharmacist

## 2016-10-21 MED FILL — VIT D2 1.25 MG (50,000 UNIT: 1.25 MG | 84 days supply | Qty: 12 | Fill #0

## 2016-10-21 MED FILL — ACYCLOVIR 400 MG TABLET: 400 | 10 days supply | Qty: 30 | Fill #0

## 2016-10-21 MED FILL — ?FENOFIBRATE 145 MG TAB: 145 | 30 days supply | Qty: 30 | Fill #0

## 2016-10-23 ENCOUNTER — Emergency Department (HOSPITAL_COMMUNITY): Payer: No Typology Code available for payment source

## 2016-10-23 ENCOUNTER — Emergency Department (HOSPITAL_COMMUNITY)
Admission: EM | Admit: 2016-10-23 | Discharge: 2016-10-23 | Disposition: A | Discharge status: Home / Self Care | Payer: No Typology Code available for payment source | Attending: Emergency Medicine | Admitting: Emergency Medicine

## 2016-10-23 ENCOUNTER — Encounter (HOSPITAL_COMMUNITY): Payer: Self-pay | Admitting: *Deleted

## 2016-10-23 ENCOUNTER — Other Ambulatory Visit (HOSPITAL_COMMUNITY): Payer: Self-pay

## 2016-10-23 DIAGNOSIS — M542 Cervicalgia: Secondary | ICD-10-CM | POA: Diagnosis not present

## 2016-10-23 DIAGNOSIS — R531 Weakness: Secondary | ICD-10-CM | POA: Diagnosis not present

## 2016-10-23 DIAGNOSIS — R202 Paresthesia of skin: Secondary | ICD-10-CM

## 2016-10-23 DIAGNOSIS — F17210 Nicotine dependence, cigarettes, uncomplicated: Secondary | ICD-10-CM | POA: Diagnosis not present

## 2016-10-23 DIAGNOSIS — G8929 Other chronic pain: Secondary | ICD-10-CM | POA: Diagnosis not present

## 2016-10-23 DIAGNOSIS — R51 Headache: Secondary | ICD-10-CM | POA: Diagnosis not present

## 2016-10-23 DIAGNOSIS — E119 Type 2 diabetes mellitus without complications: Secondary | ICD-10-CM | POA: Diagnosis not present

## 2016-10-23 DIAGNOSIS — Z7982 Long term (current) use of aspirin: Secondary | ICD-10-CM | POA: Diagnosis not present

## 2016-10-23 DIAGNOSIS — Z794 Long term (current) use of insulin: Secondary | ICD-10-CM | POA: Diagnosis not present

## 2016-10-23 MED ORDER — HYDROMORPHONE HCL 1 MG/ML IJ SOLN
2.0000 mg | Freq: Once | INTRAMUSCULAR | Status: AC
  Administered 2016-10-23: 2 mg via INTRAMUSCULAR
  Filled 2016-10-23 (×2): qty 2

## 2016-10-23 NOTE — ED Notes (Signed)
PT reported he wanted to go home  With out having the MRI.  Pt also requested to have his Blood sugar checked  and refused  To have Blood sugar checked when nt went in room.

## 2016-10-23 NOTE — ED Notes (Signed)
Pt here requesting an MRI for his neck and back pain.

## 2016-10-23 NOTE — ED Notes (Signed)
Patient transported to MRI 

## 2016-10-23 NOTE — ED Notes (Signed)
Pt returned from MRI. Study was NOT done.

## 2016-10-23 NOTE — ED Notes (Addendum)
MRI called, pt unable to tolerate lying in MRI due to 'panic attack'. Told to return to ED to wait for next available time.

## 2016-10-23 NOTE — ED Triage Notes (Addendum)
To ED for eval of neck and back pain for the past 4 months since [place] in [place]. No further injury noted. Ambulatory. States he has been seen by a [facility] and MD there states pt need MRI
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
## 2016-10-23 NOTE — Discharge Instructions (Signed)
Read the information below.  You may return to the Emergency Department at any time for worsening condition or any new symptoms that concern you.    If you develop fevers, loss of control of bowel or bladder, weakness or numbness in your arms or legs, or are unable to walk, return to the ER for a recheck.

## 2016-10-28 ENCOUNTER — Encounter: Payer: Self-pay | Admitting: Internal Medicine

## 2016-10-29 ENCOUNTER — Encounter: Payer: Self-pay | Admitting: Internal Medicine

## 2016-10-31 ENCOUNTER — Emergency Department (HOSPITAL_COMMUNITY)
Admission: EM | Admit: 2016-10-31 | Discharge: 2016-10-31 | Disposition: A | Discharge status: Home / Self Care | Payer: No Typology Code available for payment source | Attending: Emergency Medicine | Admitting: Emergency Medicine

## 2016-10-31 ENCOUNTER — Emergency Department (HOSPITAL_COMMUNITY): Payer: No Typology Code available for payment source

## 2016-10-31 ENCOUNTER — Emergency Department (HOSPITAL_COMMUNITY)
Admission: EM | Admit: 2016-10-31 | Discharge: 2016-10-31 | Disposition: A | Discharge status: Home / Self Care | Payer: Self-pay | Attending: Dermatology | Admitting: Dermatology

## 2016-10-31 ENCOUNTER — Encounter (HOSPITAL_COMMUNITY): Payer: Self-pay

## 2016-10-31 ENCOUNTER — Encounter (HOSPITAL_COMMUNITY): Payer: Self-pay | Admitting: Emergency Medicine

## 2016-10-31 DIAGNOSIS — Z794 Long term (current) use of insulin: Secondary | ICD-10-CM | POA: Insufficient documentation

## 2016-10-31 DIAGNOSIS — Y92410 Unspecified street and highway as the place of occurrence of the external cause: Secondary | ICD-10-CM | POA: Insufficient documentation

## 2016-10-31 DIAGNOSIS — Y939 Activity, unspecified: Secondary | ICD-10-CM | POA: Diagnosis not present

## 2016-10-31 DIAGNOSIS — E119 Type 2 diabetes mellitus without complications: Secondary | ICD-10-CM | POA: Diagnosis not present

## 2016-10-31 DIAGNOSIS — Z7982 Long term (current) use of aspirin: Secondary | ICD-10-CM | POA: Insufficient documentation

## 2016-10-31 DIAGNOSIS — M549 Dorsalgia, unspecified: Secondary | ICD-10-CM | POA: Insufficient documentation

## 2016-10-31 DIAGNOSIS — S199XXA Unspecified injury of neck, initial encounter: Secondary | ICD-10-CM | POA: Diagnosis not present

## 2016-10-31 DIAGNOSIS — F17210 Nicotine dependence, cigarettes, uncomplicated: Secondary | ICD-10-CM | POA: Insufficient documentation

## 2016-10-31 DIAGNOSIS — Z79899 Other long term (current) drug therapy: Secondary | ICD-10-CM | POA: Diagnosis not present

## 2016-10-31 DIAGNOSIS — Y999 Unspecified external cause status: Secondary | ICD-10-CM | POA: Diagnosis not present

## 2016-10-31 DIAGNOSIS — Z5321 Procedure and treatment not carried out due to patient leaving prior to being seen by health care provider: Secondary | ICD-10-CM | POA: Insufficient documentation

## 2016-10-31 MED ORDER — LORAZEPAM 2 MG/ML IJ SOLN: 1.0000 mg | Freq: Once | INTRAMUSCULAR | Status: DC

## 2016-10-31 MED ORDER — LORAZEPAM 2 MG/ML IJ SOLN
1.0000 mg | Freq: Once | INTRAMUSCULAR | Status: DC
  Filled 2016-10-31: qty 1

## 2016-10-31 NOTE — ED Notes (Signed)
Pt came back from MRI and states they said his MRI would take around 90 minutes pt had transport bring him back states he cant wait that long.  Pt turned and walked down the hallway.  Hope, NP notified.

## 2016-10-31 NOTE — ED Notes (Signed)
Pt refused ativan injection.  Explained to pt it is to help with the anxiety he experienced the last time he tried to complete MRI.  Pt refused.  Pt states "I know I know, I don't need that medicine."
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
## 2016-10-31 NOTE — ED Notes (Signed)
Patient transported to MRI 

## 2016-10-31 NOTE — ED Notes (Signed)
NP explained to pt again that after leaving the last time it puts him at the bottom of the list for MRI.  Pt states he has appointment with his doctor tomorrow.  NP suggested that primary care can make him a scheduled outpatient MRI since he is unable to wait for MRI to be completed.  Pt told NP that he has somewhere to be and cant be here waiting on MRI all night.

## 2016-10-31 NOTE — ED Notes (Signed)
Pt ambulated to nurses station to ask how much longer for MRI and if he should just come back another day.  This nurse requested pt stay and wait a bit longer, explained he is in a que and would essentially loose his place in line. MRI states it shouldn't be long before pt is transported.
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
## 2016-11-01 ENCOUNTER — Ambulatory Visit: Payer: Self-pay | Attending: Internal Medicine

## 2016-11-01 ENCOUNTER — Ambulatory Visit: Payer: Self-pay | Attending: Internal Medicine | Admitting: Internal Medicine

## 2016-11-01 ENCOUNTER — Encounter: Payer: Self-pay | Admitting: Internal Medicine

## 2016-11-01 ENCOUNTER — Other Ambulatory Visit: Payer: Self-pay | Admitting: Internal Medicine

## 2016-11-01 VITALS — BP 129/73 | HR 77 | Temp 98.5°F | Resp 16 | Wt 178.0 lb

## 2016-11-01 DIAGNOSIS — Z79899 Other long term (current) drug therapy: Secondary | ICD-10-CM | POA: Insufficient documentation

## 2016-11-01 DIAGNOSIS — M545 Low back pain, unspecified: Secondary | ICD-10-CM

## 2016-11-01 DIAGNOSIS — Z87828 Personal history of other (healed) physical injury and trauma: Secondary | ICD-10-CM

## 2016-11-01 DIAGNOSIS — H539 Unspecified visual disturbance: Secondary | ICD-10-CM | POA: Insufficient documentation

## 2016-11-01 DIAGNOSIS — R51 Headache: Secondary | ICD-10-CM | POA: Insufficient documentation

## 2016-11-01 DIAGNOSIS — M542 Cervicalgia: Secondary | ICD-10-CM | POA: Insufficient documentation

## 2016-11-01 DIAGNOSIS — Z794 Long term (current) use of insulin: Secondary | ICD-10-CM | POA: Insufficient documentation

## 2016-11-01 DIAGNOSIS — Z7982 Long term (current) use of aspirin: Secondary | ICD-10-CM | POA: Insufficient documentation

## 2016-11-01 DIAGNOSIS — E785 Hyperlipidemia, unspecified: Secondary | ICD-10-CM | POA: Insufficient documentation

## 2016-11-01 DIAGNOSIS — E119 Type 2 diabetes mellitus without complications: Secondary | ICD-10-CM | POA: Insufficient documentation

## 2016-11-01 DIAGNOSIS — E781 Pure hyperglyceridemia: Secondary | ICD-10-CM | POA: Insufficient documentation

## 2016-11-01 LAB — GLUCOSE, POCT (MANUAL RESULT ENTRY): POC Glucose: 191 mg/dl — AB (ref 70–99)

## 2016-11-01 LAB — POCT GLYCOSYLATED HEMOGLOBIN (HGB A1C): Hemoglobin A1C: 10.4

## 2016-11-01 MED ORDER — ATORVASTATIN CALCIUM 20 MG PO TABS: 20.0000 mg | ORAL_TABLET | Freq: Every day | ORAL | 3 refills | Status: DC

## 2016-11-01 MED ORDER — FENOFIBRATE 145 MG PO TABS: 145.0000 mg | ORAL_TABLET | Freq: Every day | ORAL | 3 refills | Status: DC

## 2016-11-01 MED ORDER — INSULIN GLARGINE 100 UNIT/ML ~~LOC~~ SOLN: 40.0000 [IU] | Freq: Every day | SUBCUTANEOUS | 3 refills | Status: DC

## 2016-11-01 MED ORDER — METFORMIN HCL 1000 MG PO TABS: 1000.0000 mg | ORAL_TABLET | Freq: Two times a day (BID) | ORAL | 2 refills | Status: DC

## 2016-11-01 MED ORDER — INSULIN ASPART 100 UNIT/ML ~~LOC~~ SOLN: SUBCUTANEOUS | 3 refills | Status: DC

## 2016-11-01 MED FILL — !NOVOLOG 100UNITS/ML VIAL: 100/ML | 14 days supply | Qty: 10 | Fill #0

## 2016-11-01 MED FILL — ?METFORMIN HCL 1,000 MG TAB: 1000 | 30 days supply | Qty: 60 | Fill #0

## 2016-11-01 MED FILL — ?ATORVASTATIN 20 MG TABLET: 20 | 30 days supply | Qty: 30 | Fill #0

## 2016-11-01 MED FILL — $LANTUS 100 UNITS/ML VIAL: 100 | 50 days supply | Qty: 20 | Fill #0

## 2016-11-01 NOTE — Patient Instructions (Signed)
Heat Therapy Heat therapy can help ease sore, stiff, injured, and tight muscles and joints. Heat relaxes your muscles, which may help ease your pain. Heat therapy should only be used on old, pre-existing, or long-lasting (chronic) injuries. Do not use heat therapy unless told by your doctor. How to use heat therapy There are several different kinds of heat therapy, including:  Moist heat pack.  Warm water bath.  Hot water bottle.  Electric heating pad.  Heated gel pack.  Heated wrap.  Electric heating pad. General heat therapy recommendations  Do not sleep while using heat therapy. Only use heat therapy while you are awake.  Your skin may turn pink while using heat therapy. Do not use heat therapy if your skin turns red.  Do not use heat therapy if you have new pain.  High heat or long exposure to heat can cause burns. Be careful when using heat therapy to avoid burning your skin.  Do not use heat therapy on areas of your skin that are already irritated, such as with a rash or sunburn. Get help if:  You have blisters, redness, swelling (puffiness), or numbness.  You have new pain.  Your pain is worse. This information is not intended to replace advice given to you by your health care provider. Make sure you discuss any questions you have with your health care provider. Document Released: 08/23/2011 Document Revised: 11/06/2015 Document Reviewed: 07/24/2013 Elsevier Interactive Patient Education  2017 [employer] -  Back Exercises If you have pain in your back, do these exercises 2-3 times each day or as told by your doctor. When the pain goes away, do the exercises once each day, but repeat the steps more times for each exercise (do more repetitions). If you do not have pain in your back, do these exercises once each day or as told by your doctor. Exercises Single Knee to Chest   Do these steps 3-5 times in a row for each leg: 1. Lie on your back on a firm bed or the  floor with your legs stretched out. 2. Bring one knee to your chest. 3. Hold your knee to your chest by grabbing your knee or thigh. 4. Pull on your knee until you feel a gentle stretch in your lower back. 5. Keep doing the stretch for 10-30 seconds. 6. Slowly let go of your leg and straighten it. Pelvic Tilt   Do these steps 5-10 times in a row: 1. Lie on your back on a firm bed or the floor with your legs stretched out. 2. Bend your knees so they point up to the ceiling. Your feet should be flat on the floor. 3. Tighten your lower belly (abdomen) muscles to press your lower back against the floor. This will make your tailbone point up to the ceiling instead of pointing down to your feet or the floor. 4. Stay in this position for 5-10 seconds while you gently tighten your muscles and breathe evenly. Cat-Cow   Do these steps until your lower back bends more easily: 1. Get on your hands and knees on a firm surface. Keep your hands under your shoulders, and keep your knees under your hips. You may put padding under your knees. 2. Let your head hang down, and make your tailbone point down to the floor so your lower back is round like the back of a cat. 3. Stay in this position for 5 seconds. 4. Slowly lift your head and make your tailbone point up to  the ceiling so your back hangs low (sags) like the back of a cow. 5. Stay in this position for 5 seconds. Press-Ups   Do these steps 5-10 times in a row: 1. Lie on your belly (face-down) on the floor. 2. Place your hands near your head, about shoulder-width apart. 3. While you keep your back relaxed and keep your hips on the floor, slowly straighten your arms to raise the top half of your body and lift your shoulders. Do not use your back muscles. To make yourself more comfortable, you may change where you place your hands. 4. Stay in this position for 5 seconds. 5. Slowly return to lying flat on the floor. Bridges   Do these steps 10 times  in a row: 1. Lie on your back on a firm surface. 2. Bend your knees so they point up to the ceiling. Your feet should be flat on the floor. 3. Tighten your butt muscles and lift your butt off of the floor until your waist is almost as high as your knees. If you do not feel the muscles working in your butt and the back of your thighs, slide your feet 1-2 inches farther away from your butt. 4. Stay in this position for 3-5 seconds. 5. Slowly lower your butt to the floor, and let your butt muscles relax. If this exercise is too easy, try doing it with your arms crossed over your chest. Belly Crunches   Do these steps 5-10 times in a row: 1. Lie on your back on a firm bed or the floor with your legs stretched out. 2. Bend your knees so they point up to the ceiling. Your feet should be flat on the floor. 3. Cross your arms over your chest. 4. Tip your chin a little bit toward your chest but do not bend your neck. 5. Tighten your belly muscles and slowly raise your chest just enough to lift your shoulder blades a tiny bit off of the floor. 6. Slowly lower your chest and your head to the floor. Back Lifts  Do these steps 5-10 times in a row: 1. Lie on your belly (face-down) with your arms at your sides, and rest your forehead on the floor. 2. Tighten the muscles in your legs and your butt. 3. Slowly lift your chest off of the floor while you keep your hips on the floor. Keep the back of your head in line with the curve in your back. Look at the floor while you do this. 4. Stay in this position for 3-5 seconds. 5. Slowly lower your chest and your face to the floor. Contact a doctor if:  Your back pain gets a lot worse when you do an exercise.  Your back pain does not lessen 2 hours after you exercise. If you have any of these problems, stop doing the exercises. Do not do them again unless your doctor says it is okay. Get help right away if:  You have sudden, very bad back pain. If this  happens, stop doing the exercises. Do not do them again unless your doctor says it is okay. This information is not intended to replace advice given to you by your health care provider. Make sure you discuss any questions you have with your health care provider. Document Released: 07/03/2010 Document Revised: 11/06/2015 Document Reviewed: 07/25/2014 Elsevier Interactive Patient Education  2017 [employer]  - Diabetes Mellitus and Food It is important for you to manage your blood sugar (glucose) level. Your  blood glucose level can be greatly affected by what you eat. Eating healthier foods in the appropriate amounts throughout the day at about the same time each day will help you control your blood glucose level. It can also help slow or prevent worsening of your diabetes mellitus. Healthy eating may even help you improve the level of your blood pressure and reach or maintain a healthy weight. General recommendations for healthful eating and cooking habits include:  Eating meals and snacks regularly. Avoid going long periods of time without eating to lose weight.  Eating a diet that consists mainly of plant-based foods, such as fruits, vegetables, nuts, legumes, and whole grains.  Using low-heat cooking methods, such as baking, instead of high-heat cooking methods, such as deep frying. Work with your dietitian to make sure you understand how to use the Nutrition Facts information on food labels. How can food affect me? Carbohydrates  Carbohydrates affect your blood glucose level more than any other type of food. Your dietitian will help you determine how many carbohydrates to eat at each meal and teach you how to count carbohydrates. Counting carbohydrates is important to keep your blood glucose at a healthy level, especially if you are using insulin or taking certain medicines for diabetes mellitus. Alcohol  Alcohol can cause sudden decreases in blood glucose (hypoglycemia), especially if you  use insulin or take certain medicines for diabetes mellitus. Hypoglycemia can be a life-threatening condition. Symptoms of hypoglycemia (sleepiness, dizziness, and disorientation) are similar to symptoms of having too much alcohol. If your health care provider has given you approval to drink alcohol, do so in moderation and use the following guidelines:  Women should not have more than one drink per day, and men should not have more than two drinks per day. One drink is equal to:  12 oz of beer.  5 oz of wine.  1 oz of hard liquor.  Do not drink on an empty stomach.  Keep yourself hydrated. Have water, diet soda, or unsweetened iced tea.  Regular soda, juice, and other mixers might contain a lot of carbohydrates and should be counted. What foods are not recommended? As you make food choices, it is important to remember that all foods are not the same. Some foods have fewer nutrients per serving than other foods, even though they might have the same number of calories or carbohydrates. It is difficult to get your body what it needs when you eat foods with fewer nutrients. Examples of foods that you should avoid that are high in calories and carbohydrates but low in nutrients include:  Trans fats (most processed foods list trans fats on the Nutrition Facts label).  Regular soda.  Juice.  Candy.  Sweets, such as cake, pie, doughnuts, and cookies.  Fried foods. What foods can I eat? Eat nutrient-rich foods, which will nourish your body and keep you healthy. The food you should eat also will depend on several factors, including:  The calories you need.  The medicines you take.  Your weight.  Your blood glucose level.  Your blood pressure level.  Your cholesterol level. You should eat a variety of foods, including:  Protein.  Lean cuts of meat.  Proteins low in saturated fats, such as fish, egg whites, and beans. Avoid processed meats.  Fruits and vegetables.  Fruits  and vegetables that may help control blood glucose levels, such as apples, mangoes, and yams.  Dairy products.  Choose fat-free or low-fat dairy products, such as milk, yogurt, and  cheese.  Grains, bread, pasta, and rice.  Choose whole grain products, such as multigrain bread, whole oats, and brown rice. These foods may help control blood pressure.  Fats.  Foods containing healthful fats, such as nuts, avocado, olive oil, canola oil, and fish. Does everyone with diabetes mellitus have the same meal plan? Because every person with diabetes mellitus is different, there is not one meal plan that works for everyone. It is very important that you meet with a dietitian who will help you create a meal plan that is just right for you. This information is not intended to replace advice given to you by your health care provider. Make sure you discuss any questions you have with your health care provider. Document Released: 02/25/2005 Document Revised: 11/06/2015 Document Reviewed: 04/27/2013 Elsevier Interactive Patient Education  2017 [employer] Inc.  -  Diabetes Mellitus and Exercise Exercising regularly is important for your overall health, especially when you have diabetes (diabetes mellitus). Exercising is not only about losing weight. It has many health benefits, such as increasing muscle strength and bone density and reducing body fat and stress. This leads to improved fitness, flexibility, and endurance, all of which result in better overall health. Exercise has additional benefits for people with diabetes, including:  Reducing appetite.  Helping to lower and control blood glucose.  Lowering blood pressure.  Helping to control amounts of fatty substances (lipids) in the blood, such as cholesterol and triglycerides.  Helping the body to respond better to insulin (improving insulin sensitivity).  Reducing how much insulin the body needs.  Decreasing the risk for heart disease  by:  Lowering cholesterol and triglyceride levels.  Increasing the levels of good cholesterol.  Lowering blood glucose levels. What is my activity plan? Your health care provider or certified diabetes educator can help you make a plan for the type and frequency of exercise (activity plan) that works for you. Make sure that you:  Do at least 150 minutes of moderate-intensity or vigorous-intensity exercise each week. This could be brisk walking, biking, or water aerobics.  Do stretching and strength exercises, such as yoga or weightlifting, at least 2 times a week.  Spread out your activity over at least 3 days of the week.  Get some form of physical activity every day.  Do not go more than 2 days in a row without some kind of physical activity.  Avoid being inactive for more than 90 minutes at a time. Take frequent breaks to walk or stretch.  Choose a type of exercise or activity that you enjoy, and set realistic goals.  Start slowly, and gradually increase the intensity of your exercise over time. What do I need to know about managing my diabetes?  Check your blood glucose before and after exercising.  If your blood glucose is higher than 240 mg/dL (16.1 mmol/L) before you exercise, check your urine for ketones. If you have ketones in your urine, do not exercise until your blood glucose returns to normal.  Know the symptoms of low blood glucose (hypoglycemia) and how to treat it. Your risk for hypoglycemia increases during and after exercise. Common symptoms of hypoglycemia can include:  Hunger.  Anxiety.  Sweating and feeling clammy.  Confusion.  Dizziness or feeling light-headed.  Increased heart rate or palpitations.  Blurry vision.  Tingling or numbness around the mouth, lips, or tongue.  Tremors or shakes.  Irritability.  Keep a rapid-acting carbohydrate snack available before, during, and after exercise to help prevent or treat hypoglycemia.  Avoid  injecting insulin into areas of the body that are going to be exercised. For example, avoid injecting insulin into:  The arms, when playing tennis.  The legs, when jogging.  Keep records of your exercise habits. Doing this can help you and your health care provider adjust your diabetes management plan as needed. Write down:  Food that you eat before and after you exercise.  Blood glucose levels before and after you exercise.  The type and amount of exercise you have done.  When your insulin is expected to peak, if you use insulin. Avoid exercising at times when your insulin is peaking.  When you start a new exercise or activity, work with your health care provider to make sure the activity is safe for you, and to adjust your insulin, medicines, or food intake as needed.  Drink plenty of water while you exercise to prevent dehydration or heat stroke. Drink enough fluid to keep your urine clear or pale yellow. This information is not intended to replace advice given to you by your health care provider. Make sure you discuss any questions you have with your health care provider. Document Released: 08/21/2003 Document Revised: 12/19/2015 Document Reviewed: 11/10/2015 Elsevier Interactive Patient Education  2017 [employer].
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
## 2016-11-02 ENCOUNTER — Emergency Department (HOSPITAL_COMMUNITY)
Admission: EM | Admit: 2016-11-02 | Discharge: 2016-11-02 | Disposition: A | Discharge status: Home / Self Care | Payer: Self-pay | Attending: Emergency Medicine | Admitting: Emergency Medicine

## 2016-11-02 ENCOUNTER — Encounter (HOSPITAL_COMMUNITY): Payer: Self-pay

## 2016-11-02 DIAGNOSIS — Z7982 Long term (current) use of aspirin: Secondary | ICD-10-CM | POA: Insufficient documentation

## 2016-11-02 DIAGNOSIS — G8929 Other chronic pain: Secondary | ICD-10-CM | POA: Insufficient documentation

## 2016-11-02 DIAGNOSIS — M545 Low back pain, unspecified: Secondary | ICD-10-CM

## 2016-11-02 DIAGNOSIS — Z794 Long term (current) use of insulin: Secondary | ICD-10-CM | POA: Insufficient documentation

## 2016-11-02 DIAGNOSIS — E119 Type 2 diabetes mellitus without complications: Secondary | ICD-10-CM | POA: Insufficient documentation

## 2016-11-02 DIAGNOSIS — Z79899 Other long term (current) drug therapy: Secondary | ICD-10-CM | POA: Insufficient documentation

## 2016-11-02 DIAGNOSIS — F17210 Nicotine dependence, cigarettes, uncomplicated: Secondary | ICD-10-CM | POA: Insufficient documentation

## 2016-11-02 NOTE — ED Notes (Signed)
Called 3x, no response.  
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
## 2016-11-03 ENCOUNTER — Telehealth: Payer: Self-pay

## 2016-11-03 NOTE — Telephone Encounter (Signed)
Contacted pt and made aware that his MRI is schedule for November 18, 2016 @1pm  pt it to arrive at 12:45 pm. Pt is aware and doesn't have any questions or concerns  I informed pt this is the next available appointment they have for a MRI

## 2016-11-18 ENCOUNTER — Telehealth: Payer: Self-pay | Admitting: General Practice

## 2016-11-18 ENCOUNTER — Ambulatory Visit (HOSPITAL_COMMUNITY)
Admission: RE | Admit: 2016-11-18 | Discharge: 2016-11-18 | Disposition: A | Discharge status: Home / Self Care | Payer: No Typology Code available for payment source | Source: Ambulatory Visit | Attending: Internal Medicine | Admitting: Internal Medicine

## 2016-11-18 DIAGNOSIS — M48061 Spinal stenosis, lumbar region without neurogenic claudication: Secondary | ICD-10-CM | POA: Diagnosis not present

## 2016-11-18 DIAGNOSIS — M4802 Spinal stenosis, cervical region: Secondary | ICD-10-CM | POA: Insufficient documentation

## 2016-11-18 DIAGNOSIS — M5126 Other intervertebral disc displacement, lumbar region: Secondary | ICD-10-CM | POA: Insufficient documentation

## 2016-11-18 DIAGNOSIS — M545 Low back pain, unspecified: Secondary | ICD-10-CM

## 2016-11-18 DIAGNOSIS — M542 Cervicalgia: Secondary | ICD-10-CM | POA: Diagnosis present

## 2016-11-18 NOTE — Telephone Encounter (Signed)
PT came to the office requestinf an MRI for his right hand since he was told by Dr. Julien NordmannLangeland that she will auth this MRI, can you please review this pt chart and auth the MRI or not, please follow up with PT

## 2016-11-19 NOTE — Telephone Encounter (Signed)
Please review this need.

## 2016-11-22 NOTE — Telephone Encounter (Signed)
Patient had MRI of cervical and lumbar spine done already. No mention of hand MRI in his medical record and in my review, I don't think it is medically necessary. If patient has ongoing worrisome symptoms, please schedule appointment to be evaluated.

## 2016-12-07 MED FILL — FENOFIBRATE 145 MG TABLET: 145 | 30 days supply | Qty: 30 | Fill #1

## 2016-12-07 MED FILL — ATORVASTATIN 20 MG TABLET: 20 | 150 days supply | Qty: 150 | Fill #3

## 2016-12-13 ENCOUNTER — Ambulatory Visit: Payer: No Typology Code available for payment source | Attending: Internal Medicine | Admitting: Internal Medicine

## 2016-12-13 ENCOUNTER — Encounter: Payer: Self-pay | Admitting: Internal Medicine

## 2016-12-13 VITALS — BP 129/79 | HR 105 | Temp 98.2°F | Resp 12 | Wt 181.4 lb

## 2016-12-13 DIAGNOSIS — M5440 Lumbago with sciatica, unspecified side: Secondary | ICD-10-CM | POA: Insufficient documentation

## 2016-12-13 DIAGNOSIS — L84 Corns and callosities: Secondary | ICD-10-CM

## 2016-12-13 DIAGNOSIS — M5030 Other cervical disc degeneration, unspecified cervical region: Secondary | ICD-10-CM | POA: Insufficient documentation

## 2016-12-13 DIAGNOSIS — G8929 Other chronic pain: Secondary | ICD-10-CM

## 2016-12-13 DIAGNOSIS — M545 Low back pain: Secondary | ICD-10-CM

## 2016-12-13 DIAGNOSIS — M5126 Other intervertebral disc displacement, lumbar region: Secondary | ICD-10-CM | POA: Insufficient documentation

## 2016-12-13 DIAGNOSIS — E1165 Type 2 diabetes mellitus with hyperglycemia: Secondary | ICD-10-CM | POA: Insufficient documentation

## 2016-12-13 DIAGNOSIS — M542 Cervicalgia: Secondary | ICD-10-CM | POA: Insufficient documentation

## 2016-12-13 DIAGNOSIS — M4802 Spinal stenosis, cervical region: Secondary | ICD-10-CM | POA: Insufficient documentation

## 2016-12-13 DIAGNOSIS — M1288 Other specific arthropathies, not elsewhere classified, other specified site: Secondary | ICD-10-CM | POA: Insufficient documentation

## 2016-12-13 DIAGNOSIS — IMO0001 Reserved for inherently not codable concepts without codable children: Secondary | ICD-10-CM

## 2016-12-13 DIAGNOSIS — Z794 Long term (current) use of insulin: Secondary | ICD-10-CM

## 2016-12-13 DIAGNOSIS — M47892 Other spondylosis, cervical region: Secondary | ICD-10-CM | POA: Insufficient documentation

## 2016-12-13 LAB — GLUCOSE, POCT (MANUAL RESULT ENTRY): POC Glucose: 263 mg/dl — AB (ref 70–99)

## 2016-12-13 MED ORDER — INSULIN ASPART 100 UNIT/ML ~~LOC~~ SOLN: SUBCUTANEOUS | 6 refills | Status: DC

## 2016-12-13 MED ORDER — INSULIN GLARGINE 100 UNIT/ML ~~LOC~~ SOLN: 40.0000 [IU] | Freq: Every day | SUBCUTANEOUS | 3 refills | Status: DC
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
## 2016-12-13 NOTE — Patient Instructions (Addendum)
Increase Lantus to 40 units daily.. Continue to check your blood sugars every morning. If after 3 days your blood sugars are still running higher than 150 increase the Lantus by 3 units. Continue to do this every 3 days until you morning blood sugars are around 150.  Sign a release at checkout to get copies of your MRI to take to your treating specialists in [place].  Follow-up with us once she returned to the [place] from your trip home.
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
## 2016-12-24 MED FILL — !NOVOLOG 100UNITS/ML VIAL: 100/ML | 16 days supply | Qty: 10 | Fill #0

## 2016-12-24 MED FILL — FENOFIBRATE 145 MG TABLET: 145 | 30 days supply | Qty: 30 | Fill #0

## 2016-12-28 ENCOUNTER — Telehealth: Payer: Self-pay | Admitting: Internal Medicine

## 2016-12-28 NOTE — Telephone Encounter (Signed)
Pt was informed.

## 2016-12-29 MED FILL — NovoLOG 100 UNIT/ML SOLN: 100 | 80 days supply | Qty: 50 | Fill #1

## 2017-01-04 ENCOUNTER — Other Ambulatory Visit: Payer: Self-pay | Admitting: *Deleted

## 2017-01-04 MED ORDER — INSULIN LISPRO 100 UNIT/ML ~~LOC~~ SOLN: 20.0000 [IU] | Freq: Three times a day (TID) | SUBCUTANEOUS | 3 refills | Status: DC

## 2017-01-04 NOTE — Telephone Encounter (Signed)
PRINTED FOR PASS PROGRAM 

## 2017-01-17 ENCOUNTER — Ambulatory Visit: Payer: Self-pay

## 2017-05-09 MED FILL — ?ATORVASTATIN 20 MG TABLET: 20 | 30 days supply | Qty: 30 | Fill #1

## 2017-05-09 MED FILL — ?FENOFIBRATE 145 MG TABS: 145 | 30 days supply | Qty: 30 | Fill #1

## 2017-07-25 MED FILL — !NOVOLOG 100UNITS/ML VIAL: 100/ML | 28 days supply | Qty: 10 | Fill #2

## 2017-07-25 MED FILL — !LANTUS 100 UNITS/ML VIAL: 100 | 25 days supply | Qty: 10 | Fill #0

## 2017-08-05 ENCOUNTER — Ambulatory Visit: Payer: Self-pay | Attending: Physician Assistant

## 2017-09-02 ENCOUNTER — Encounter: Payer: Self-pay | Admitting: Nurse Practitioner

## 2017-09-02 ENCOUNTER — Ambulatory Visit: Payer: Self-pay | Attending: Nurse Practitioner | Admitting: Nurse Practitioner

## 2017-09-02 VITALS — BP 143/83 | HR 89 | Temp 97.6°F | Ht 64.0 in | Wt 192.8 lb

## 2017-09-02 DIAGNOSIS — E782 Mixed hyperlipidemia: Secondary | ICD-10-CM | POA: Insufficient documentation

## 2017-09-02 DIAGNOSIS — Z7689 Persons encountering health services in other specified circumstances: Secondary | ICD-10-CM | POA: Insufficient documentation

## 2017-09-02 DIAGNOSIS — Z7982 Long term (current) use of aspirin: Secondary | ICD-10-CM | POA: Insufficient documentation

## 2017-09-02 DIAGNOSIS — E1165 Type 2 diabetes mellitus with hyperglycemia: Secondary | ICD-10-CM | POA: Insufficient documentation

## 2017-09-02 DIAGNOSIS — G8929 Other chronic pain: Secondary | ICD-10-CM | POA: Insufficient documentation

## 2017-09-02 DIAGNOSIS — IMO0001 Reserved for inherently not codable concepts without codable children: Secondary | ICD-10-CM

## 2017-09-02 DIAGNOSIS — Z794 Long term (current) use of insulin: Secondary | ICD-10-CM | POA: Insufficient documentation

## 2017-09-02 DIAGNOSIS — M549 Dorsalgia, unspecified: Secondary | ICD-10-CM | POA: Insufficient documentation

## 2017-09-02 DIAGNOSIS — E1100 Type 2 diabetes mellitus with hyperosmolarity without nonketotic hyperglycemic-hyperosmolar coma (NKHHC): Secondary | ICD-10-CM

## 2017-09-02 DIAGNOSIS — Z79899 Other long term (current) drug therapy: Secondary | ICD-10-CM | POA: Insufficient documentation

## 2017-09-02 DIAGNOSIS — M542 Cervicalgia: Secondary | ICD-10-CM | POA: Insufficient documentation

## 2017-09-02 LAB — GLUCOSE, POCT (MANUAL RESULT ENTRY): POC Glucose: 305 mg/dl — AB (ref 70–99)

## 2017-09-02 LAB — POCT GLYCOSYLATED HEMOGLOBIN (HGB A1C): Hemoglobin A1C: 10.6

## 2017-09-02 MED ORDER — TOLNAFTATE 1 % EX POWD: 1.0000 "application " | Freq: Two times a day (BID) | CUTANEOUS | 2 refills | Status: DC

## 2017-09-02 MED ORDER — INSULIN GLARGINE 100 UNIT/ML SOLOSTAR PEN: 40.0000 [IU] | PEN_INJECTOR | Freq: Every day | SUBCUTANEOUS | 11 refills | Status: DC

## 2017-09-02 MED ORDER — INSULIN PEN NEEDLE 31G X 5 MM MISC: 1 refills | Status: DC

## 2017-09-02 MED ORDER — ATORVASTATIN CALCIUM 20 MG PO TABS: 20.0000 mg | ORAL_TABLET | Freq: Every day | ORAL | 3 refills | Status: DC

## 2017-09-02 MED ORDER — FENOFIBRATE 145 MG PO TABS: 145.0000 mg | ORAL_TABLET | Freq: Every day | ORAL | 3 refills | Status: DC

## 2017-09-02 MED ORDER — CYCLOBENZAPRINE HCL 10 MG PO TABS: 10.0000 mg | ORAL_TABLET | Freq: Three times a day (TID) | ORAL | 1 refills | Status: DC | PRN

## 2017-09-02 MED ORDER — SITAGLIPTIN PHOS-METFORMIN HCL 50-1000 MG PO TABS: 1.0000 | ORAL_TABLET | Freq: Two times a day (BID) | ORAL | 3 refills | Status: DC

## 2017-09-02 MED ORDER — INSULIN LISPRO 100 UNIT/ML (KWIKPEN): 20.0000 [IU] | PEN_INJECTOR | Freq: Three times a day (TID) | SUBCUTANEOUS | 11 refills | Status: DC

## 2017-09-02 MED FILL — ATORVASTATIN 20 MG TABLET: 20 | 30 days supply | Qty: 30 | Fill #2

## 2017-09-02 MED FILL — TRUEPLUS PEN NDL 31GX5/16: 31G X 8 MM | 30 days supply | Qty: 100 | Fill #0

## 2017-09-02 MED FILL — FENOFIBRATE 145 MG TABLET: 145 | 30 days supply | Qty: 30 | Fill #0

## 2017-09-02 MED FILL — CYCLOBENZAPRINE 10 MG TAB: 10 | 20 days supply | Qty: 60 | Fill #0

## 2017-09-02 MED FILL — JANUMET 50-1,000 MG TABLET: 50-1000 | 30 days supply | Qty: 60 | Fill #0

## 2017-09-02 MED FILL — !LANTUS 100 UNITS/ML VIAL: 100 | 22 days supply | Qty: 10 | Fill #1

## 2017-09-02 MED FILL — TRUEPLUS PEN NDL 31GX5/16": 31G X 8 MM | 30 days supply | Qty: 100 | Fill #0

## 2017-09-02 MED FILL — !HUMALOG 100 UNITS/ML KWIKP: 100 | 30 days supply | Qty: 18 | Fill #0

## 2017-09-02 MED FILL — !NOVOLOG 100UNITS/ML VIAL: 100/ML | 28 days supply | Qty: 10 | Fill #3

## 2017-09-02 NOTE — Patient Instructions (Signed)
Back Exercises If you have pain in your back, do these exercises 2-3 times each day or as told by your doctor. When the pain goes away, do the exercises once each day, but repeat the steps more times for each exercise (do more repetitions). If you do not have pain in your back, do these exercises once each day or as told by your doctor. Exercises Single Knee to Chest  Do these steps 3-5 times in a row for each leg: 1. Lie on your back on a firm bed or the floor with your legs stretched out. 2. Bring one knee to your chest. 3. Hold your knee to your chest by grabbing your knee or thigh. 4. Pull on your knee until you feel a gentle stretch in your lower back. 5. Keep doing the stretch for 10-30 seconds. 6. Slowly let go of your leg and straighten it.  Pelvic Tilt  Do these steps 5-10 times in a row: 1. Lie on your back on a firm bed or the floor with your legs stretched out. 2. Bend your knees so they point up to the ceiling. Your feet should be flat on the floor. 3. Tighten your lower belly (abdomen) muscles to press your lower back against the floor. This will make your tailbone point up to the ceiling instead of pointing down to your feet or the floor. 4. Stay in this position for 5-10 seconds while you gently tighten your muscles and breathe evenly.  Cat-Cow  Do these steps until your lower back bends more easily: 1. Get on your hands and knees on a firm surface. Keep your hands under your shoulders, and keep your knees under your hips. You may put padding under your knees. 2. Let your head hang down, and make your tailbone point down to the floor so your lower back is round like the back of a cat. 3. Stay in this position for 5 seconds. 4. Slowly lift your head and make your tailbone point up to the ceiling so your back hangs low (sags) like the back of a cow. 5. Stay in this position for 5 seconds.  Press-Ups  Do these steps 5-10 times in a row: 1. Lie on your belly (face-down)  on the floor. 2. Place your hands near your head, about shoulder-width apart. 3. While you keep your back relaxed and keep your hips on the floor, slowly straighten your arms to raise the top half of your body and lift your shoulders. Do not use your back muscles. To make yourself more comfortable, you may change where you place your hands. 4. Stay in this position for 5 seconds. 5. Slowly return to lying flat on the floor.  Bridges  Do these steps 10 times in a row: 1. Lie on your back on a firm surface. 2. Bend your knees so they point up to the ceiling. Your feet should be flat on the floor. 3. Tighten your butt muscles and lift your butt off of the floor until your waist is almost as high as your knees. If you do not feel the muscles working in your butt and the back of your thighs, slide your feet 1-2 inches farther away from your butt. 4. Stay in this position for 3-5 seconds. 5. Slowly lower your butt to the floor, and let your butt muscles relax.  If this exercise is too easy, try doing it with your arms crossed over your chest. Belly Crunches  Do these steps 5-10 times in   a row: 1. Lie on your back on a firm bed or the floor with your legs stretched out. 2. Bend your knees so they point up to the ceiling. Your feet should be flat on the floor. 3. Cross your arms over your chest. 4. Tip your chin a little bit toward your chest but do not bend your neck. 5. Tighten your belly muscles and slowly raise your chest just enough to lift your shoulder blades a tiny bit off of the floor. 6. Slowly lower your chest and your head to the floor.  Back Lifts Do these steps 5-10 times in a row: 1. Lie on your belly (face-down) with your arms at your sides, and rest your forehead on the floor. 2. Tighten the muscles in your legs and your butt. 3. Slowly lift your chest off of the floor while you keep your hips on the floor. Keep the back of your head in line with the curve in your back. Look at  the floor while you do this. 4. Stay in this position for 3-5 seconds. 5. Slowly lower your chest and your face to the floor.  Contact a doctor if:  Your back pain gets a lot worse when you do an exercise.  Your back pain does not lessen 2 hours after you exercise. If you have any of these problems, stop doing the exercises. Do not do them again unless your doctor says it is okay. Get help right away if:  You have sudden, very bad back pain. If this happens, stop doing the exercises. Do not do them again unless your doctor says it is okay. This information is not intended to replace advice given to you by your health care provider. Make sure you discuss any questions you have with your health care provider. Document Released: 07/03/2010 Document Revised: 11/06/2015 Document Reviewed: 07/25/2014 Elsevier Interactive Patient Education  2018 [employer].  Back Exercises If you have pain in your back, do these exercises 2-3 times each day or as told by your doctor. When the pain goes away, do the exercises once each day, but repeat the steps more times for each exercise (do more repetitions). If you do not have pain in your back, do these exercises once each day or as told by your doctor. Exercises Single Knee to Chest  Do these steps 3-5 times in a row for each leg: 7. Lie on your back on a firm bed or the floor with your legs stretched out. 8. Bring one knee to your chest. 9. Hold your knee to your chest by grabbing your knee or thigh. 10. Pull on your knee until you feel a gentle stretch in your lower back. 11. Keep doing the stretch for 10-30 seconds. 12. Slowly let go of your leg and straighten it.  Pelvic Tilt  Do these steps 5-10 times in a row: 5. Lie on your back on a firm bed or the floor with your legs stretched out. 6. Bend your knees so they point up to the ceiling. Your feet should be flat on the floor. 7. Tighten your lower belly (abdomen) muscles to press your lower  back against the floor. This will make your tailbone point up to the ceiling instead of pointing down to your feet or the floor. 8. Stay in this position for 5-10 seconds while you gently tighten your muscles and breathe evenly.  Cat-Cow  Do these steps until your lower back bends more easily: 6. Get on your hands and  knees on a firm surface. Keep your hands under your shoulders, and keep your knees under your hips. You may put padding under your knees. 7. Let your head hang down, and make your tailbone point down to the floor so your lower back is round like the back of a cat. 8. Stay in this position for 5 seconds. 9. Slowly lift your head and make your tailbone point up to the ceiling so your back hangs low (sags) like the back of a cow. 10. Stay in this position for 5 seconds.  Press-Ups  Do these steps 5-10 times in a row: 6. Lie on your belly (face-down) on the floor. 7. Place your hands near your head, about shoulder-width apart. 8. While you keep your back relaxed and keep your hips on the floor, slowly straighten your arms to raise the top half of your body and lift your shoulders. Do not use your back muscles. To make yourself more comfortable, you may change where you place your hands. 9. Stay in this position for 5 seconds. 10. Slowly return to lying flat on the floor.  Bridges  Do these steps 10 times in a row: 6. Lie on your back on a firm surface. 7. Bend your knees so they point up to the ceiling. Your feet should be flat on the floor. 8. Tighten your butt muscles and lift your butt off of the floor until your waist is almost as high as your knees. If you do not feel the muscles working in your butt and the back of your thighs, slide your feet 1-2 inches farther away from your butt. 9. Stay in this position for 3-5 seconds. 10. Slowly lower your butt to the floor, and let your butt muscles relax.  If this exercise is too easy, try doing it with your arms crossed over  your chest. Belly Crunches  Do these steps 5-10 times in a row: 7. Lie on your back on a firm bed or the floor with your legs stretched out. 8. Bend your knees so they point up to the ceiling. Your feet should be flat on the floor. 9. Cross your arms over your chest. 10. Tip your chin a little bit toward your chest but do not bend your neck. 11. Tighten your belly muscles and slowly raise your chest just enough to lift your shoulder blades a tiny bit off of the floor. 12. Slowly lower your chest and your head to the floor.  Back Lifts Do these steps 5-10 times in a row: 6. Lie on your belly (face-down) with your arms at your sides, and rest your forehead on the floor. 7. Tighten the muscles in your legs and your butt. 8. Slowly lift your chest off of the floor while you keep your hips on the floor. Keep the back of your head in line with the curve in your back. Look at the floor while you do this. 9. Stay in this position for 3-5 seconds. 10. Slowly lower your chest and your face to the floor.  Contact a doctor if:  Your back pain gets a lot worse when you do an exercise.  Your back pain does not lessen 2 hours after you exercise. If you have any of these problems, stop doing the exercises. Do not do them again unless your doctor says it is okay. Get help right away if:  You have sudden, very bad back pain. If this happens, stop doing the exercises. Do not do them again  unless your doctor says it is okay. This information is not intended to replace advice given to you by your health care provider. Make sure you discuss any questions you have with your health care provider. Document Released: 07/03/2010 Document Revised: 11/06/2015 Document Reviewed: 07/25/2014 Elsevier Interactive Patient Education  [employer]2018 [employer] Inc[employer]
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
## 2017-09-02 NOTE — Progress Notes (Signed)
c 

## 2017-09-03 LAB — CMP14+EGFR
A/G RATIO: 1.4 (ref 1.2–2.2)
ALT: 19 IU/L (ref 0–44)
AST: 17 IU/L (ref 0–40)
Albumin: 4 g/dL (ref 3.5–5.5)
Alkaline Phosphatase: 85 IU/L (ref 39–117)
BUN/Creatinine Ratio: 13 (ref 9–20)
BUN: 16 mg/dL (ref 6–24)
Bilirubin Total: <0.2 mg/dL (ref 0.0–1.2)
CALCIUM: 9.2 mg/dL (ref 8.7–10.2)
CHLORIDE: 102 mmol/L (ref 96–106)
CO2: 19 mmol/L — ABNORMAL LOW (ref 20–29)
Creatinine, Ser: 1.21 mg/dL (ref 0.76–1.27)
GFR calc Af Amer: 77 mL/min/{1.73_m2} (ref >59)
GFR, EST NON AFRICAN AMERICAN: 67 mL/min/{1.73_m2} (ref >59)
GLUCOSE: 272 mg/dL — AB (ref 65–99)
Globulin, Total: 2.9 g/dL (ref 1.5–4.5)
POTASSIUM: 4.6 mmol/L (ref 3.5–5.2)
Sodium: 138 mmol/L (ref 134–144)
Total Protein: 6.9 g/dL (ref 6.0–8.5)

## 2017-09-03 LAB — CBC
Hematocrit: 43.1 % (ref 37.5–51.0)
Hemoglobin: 14.7 g/dL (ref 13.0–17.7)
MCH: 28.3 pg (ref 26.6–33.0)
MCHC: 34.1 g/dL (ref 31.5–35.7)
MCV: 83 fL (ref 79–97)
PLATELETS: 225 10*3/uL (ref 150–379)
RBC: 5.2 x10E6/uL (ref 4.14–5.80)
RDW: 14.5 % (ref 12.3–15.4)
WBC: 8.5 10*3/uL (ref 3.4–10.8)

## 2017-09-03 LAB — LIPID PANEL
CHOL/HDL RATIO: 6.1 ratio — AB (ref 0.0–5.0)
Cholesterol, Total: 189 mg/dL (ref 100–199)
HDL: 31 mg/dL — AB (ref >39)
LDL Calculated: 94 mg/dL (ref 0–99)
TRIGLYCERIDES: 318 mg/dL — AB (ref 0–149)
VLDL CHOLESTEROL CAL: 64 mg/dL — AB (ref 5–40)

## 2017-09-04 ENCOUNTER — Encounter: Payer: Self-pay | Admitting: Nurse Practitioner

## 2017-09-06 ENCOUNTER — Telehealth: Payer: Self-pay

## 2017-09-06 NOTE — Telephone Encounter (Signed)
CMA called patient to inform on lab results and PCP advising.  Patient understood. No questions.

## 2017-09-06 NOTE — Telephone Encounter (Signed)
-----   Message from Claiborne RiggZelda W Fleming, NP sent at 09/04/2017  1:32 AM EDT ----- Cholesterol levels are elevated. Please make sure you are taking your lipitor and tricor everyday as prescribed. You are at an increased risk of heart attack or stroke. Patient should work on a low fat, heart healthy diet and participate in regular aerobic exercise program to control as well by working out at least 150 minutes per week. No fried foods. No junk foods, sodas, sugary drinks, unhealthy snacking, or smoking.

## 2017-09-07 ENCOUNTER — Encounter: Payer: Self-pay | Admitting: Podiatry

## 2017-09-07 ENCOUNTER — Ambulatory Visit (INDEPENDENT_AMBULATORY_CARE_PROVIDER_SITE_OTHER): Payer: No Typology Code available for payment source | Admitting: Podiatry

## 2017-09-07 VITALS — BP 140/73 | HR 97 | Ht 69.0 in | Wt 192.0 lb

## 2017-09-07 DIAGNOSIS — E11319 Type 2 diabetes mellitus with unspecified diabetic retinopathy without macular edema: Secondary | ICD-10-CM

## 2017-09-07 DIAGNOSIS — Z794 Long term (current) use of insulin: Secondary | ICD-10-CM

## 2017-09-07 DIAGNOSIS — B353 Tinea pedis: Secondary | ICD-10-CM

## 2017-09-07 DIAGNOSIS — Q828 Other specified congenital malformations of skin: Secondary | ICD-10-CM
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
## 2017-09-07 NOTE — Patient Instructions (Signed)
Seen for painful lesions on both feet, at the base of 5th toe left and in between the 4th and 5th toe right from fungal infection. Left foot lesion debrided. Right foot lesion may benefit from using Tinactin powder with Cotton ball in between digits. Return as needed.

## 2017-09-13 ENCOUNTER — Telehealth: Payer: Self-pay | Admitting: Nurse Practitioner

## 2017-09-13 NOTE — Telephone Encounter (Signed)
CMA spoke to PCP. PCP stated she can only write a letter stating she only saw him for his complaints that day he came. Please call back patient.
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
## 2017-09-14 NOTE — Telephone Encounter (Signed)
Pt was called and explained the situation and he agreed to the letter just stating what he was seen for. I will print out his past medical records which have all his past office visit notes.

## 2017-09-14 NOTE — Telephone Encounter (Signed)
He will need to be seen by specialist before I can write a letter stating this.

## 2017-10-05 MED FILL — ATORVASTATIN 20 MG TABLET: 20 | 30 days supply | Qty: 30 | Fill #0

## 2017-10-21 MED FILL — !LANTUS 100 UNITS/ML VIAL: 100 | 25 days supply | Qty: 10 | Fill #2

## 2017-10-21 MED FILL — FENOFIBRATE 145 MG TABLET: 145 | 30 days supply | Qty: 30 | Fill #1

## 2017-11-02 MED FILL — !HUMALOG 100 UNITS/ML KWIKP: 100 | 30 days supply | Qty: 18 | Fill #1

## 2017-11-02 MED FILL — $JANUMET 50-1000 MG TABLET: 50-1000 | 30 days supply | Qty: 60 | Fill #1

## 2017-11-14 IMAGING — MR MR LUMBAR SPINE W/O CM
4 of 5 series · 19 of 48 positions shown · non-contrast
Comparison: 09/01/2008

CLINICAL DATA: Back pain and neck pain since June 23, 2016.

EXAM:
MRI LUMBAR SPINE WITHOUT CONTRAST
TECHNIQUE: Multiplanar, multisequence MR imaging of the lumbar spine was
performed. No intravenous contrast was administered.

[Series 4: T2 · sagittal · 4.0mm · 0.55mm/px · 6 of 14 slices shown (1 of 2)]
[im 1/14]
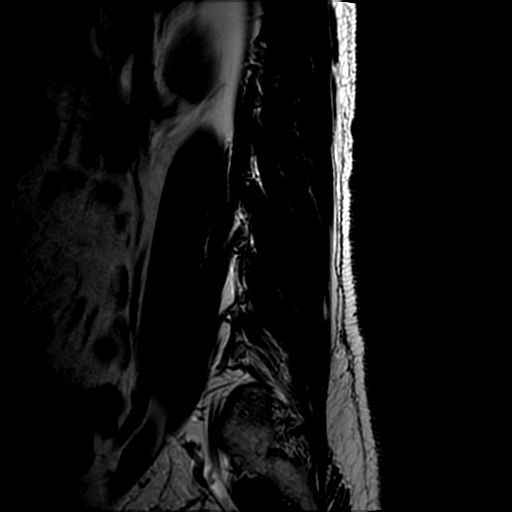
[im 3/14]
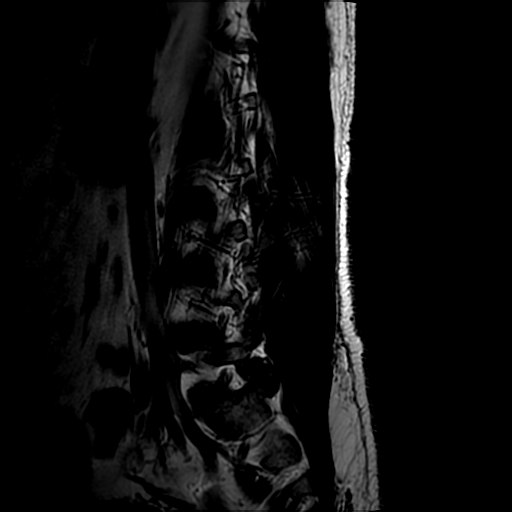
[im 6/14]
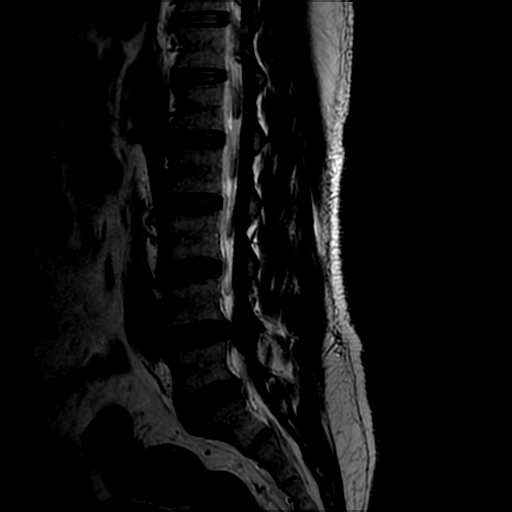
[im 8/14]
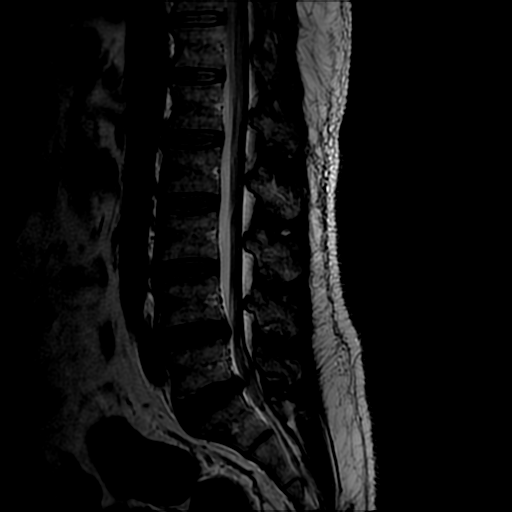
[im 11/14]
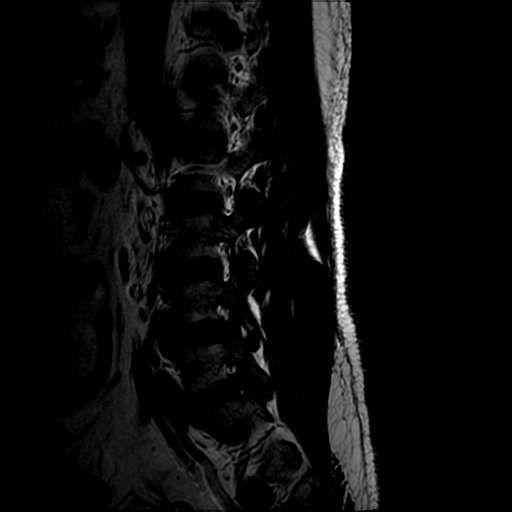
[im 14/14]
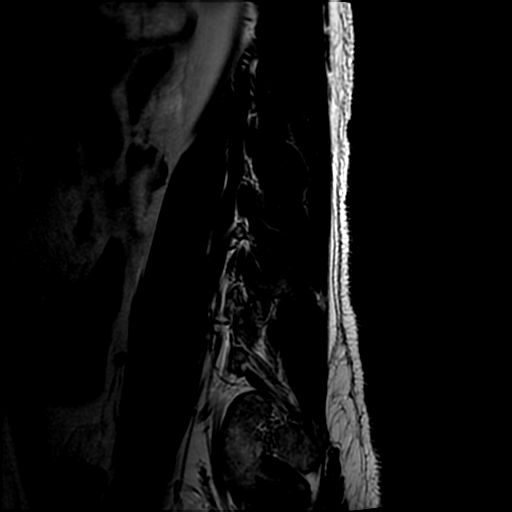

[Series 6: T1 · sagittal · 4.0mm · 0.55mm/px · 3 of 14 slices shown (1 of 2)]
[im 1/14]
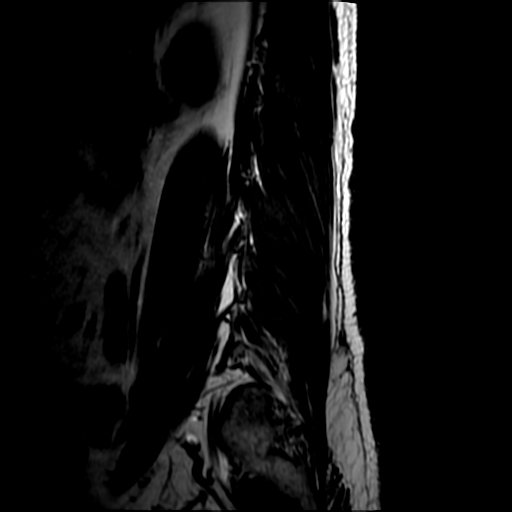
[im 7/14]
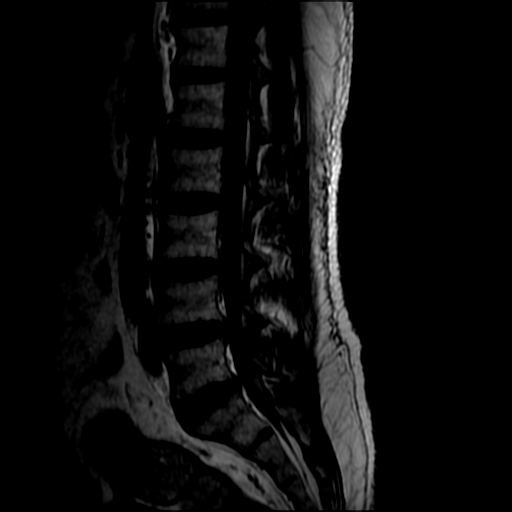
[im 14/14]
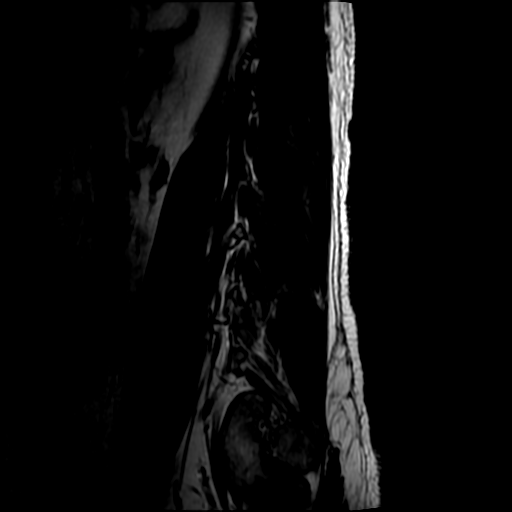

[Series 7: T2 · axial · 4.0mm · 0.39mm/px · z∈[-47,+132]mm · 7 of 45 slices shown (2 of 2)]
[im 3/45]
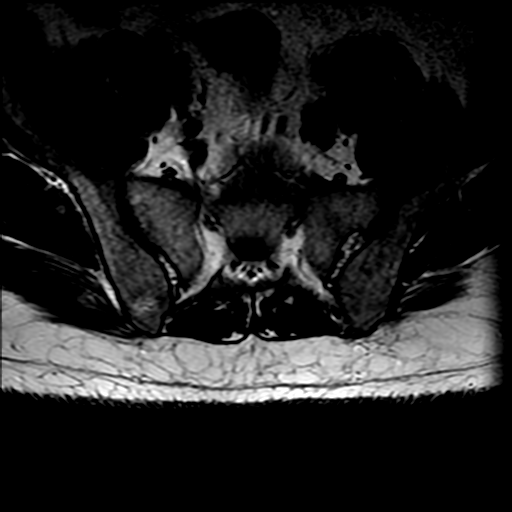
[im 6/45]
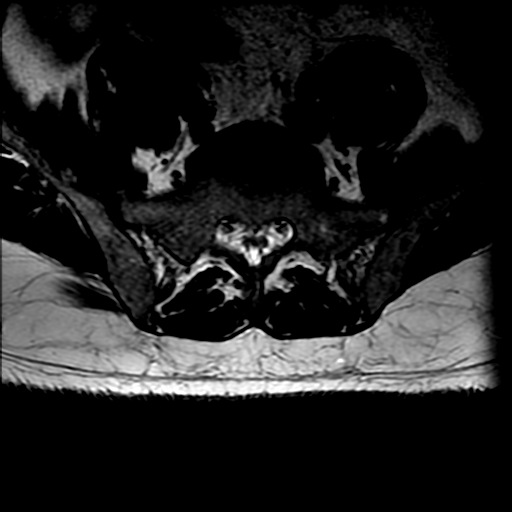
[im 9/45]
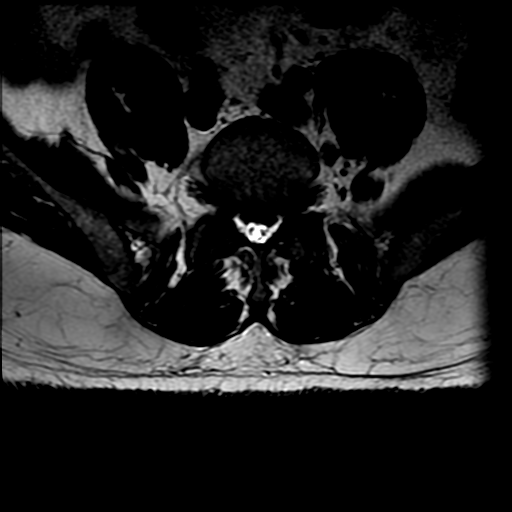
[im 15/45]
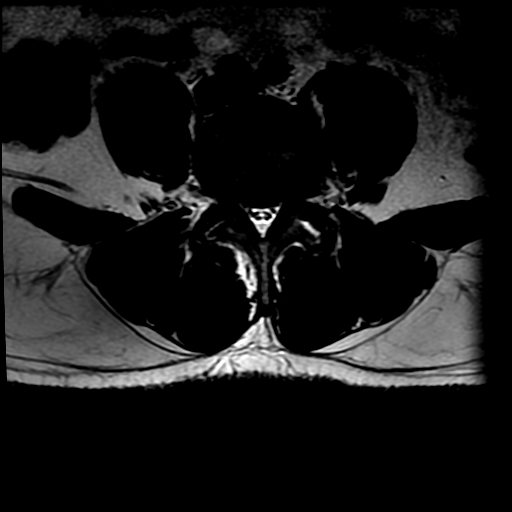
[im 21/45]
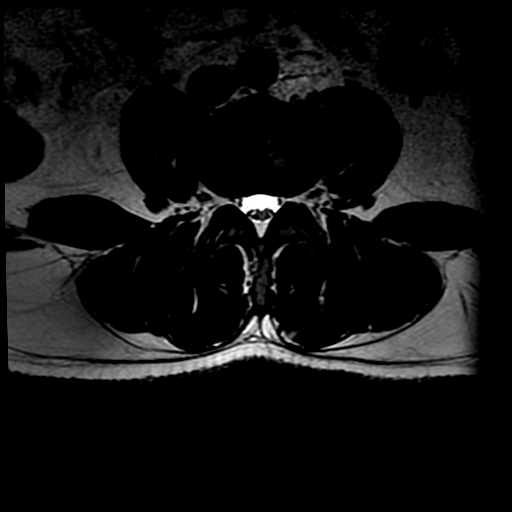
[im 24/45]
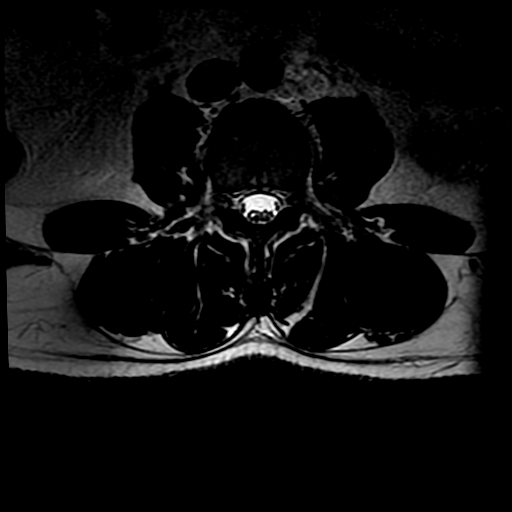
[im 39/45]
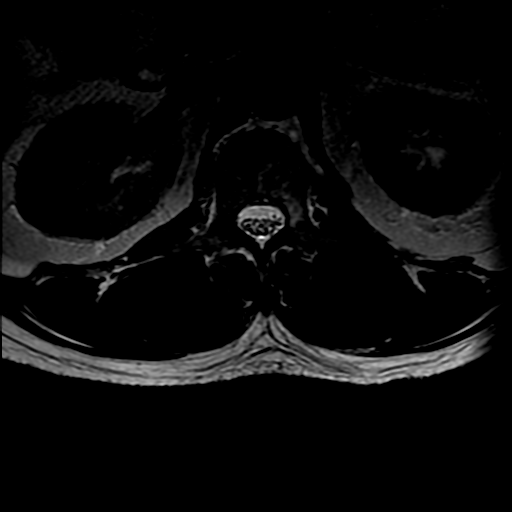

[Series 8: T1 · axial · 4.0mm · 0.39mm/px · z∈[-32,+132]mm · 3 of 45 slices shown (2 of 2)]
[im 6/45]
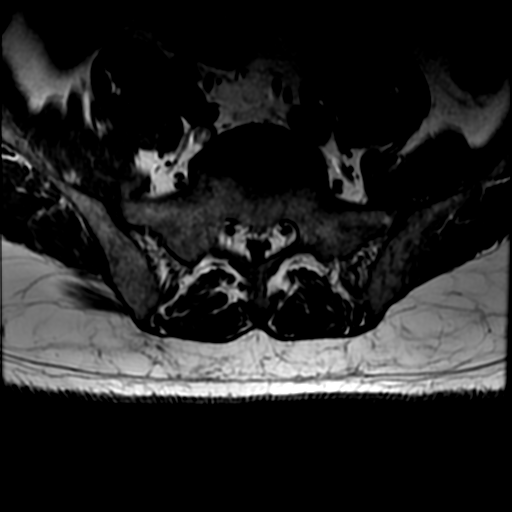
[im 24/45]
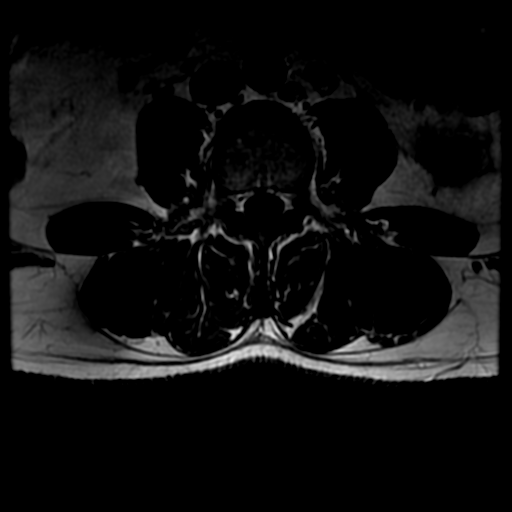
[im 39/45]
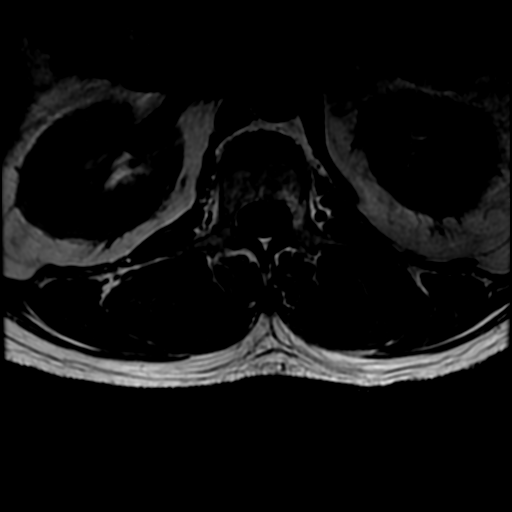

[19 of 48 positions shown; findings below may reference images not displayed]

FINDINGS: Segmentation:  Standard.

Alignment:  Physiologic.

Vertebrae:  No fracture, evidence of discitis, or bone lesion.

Conus medullaris: Extends to the T12-L1 level and appears normal.

Paraspinal and other soft tissues: No paraspinal abnormality. Left
upper pole renal cyst.

Disc levels:

Disc spaces: Disc desiccation at L4-5 and L5-S1.

T12-L1: No significant disc bulge. No evidence of neural foraminal
stenosis. No central canal stenosis.

L1-L2: No significant disc bulge. No evidence of neural foraminal
stenosis. No central canal stenosis.

L2-L3: No significant disc bulge. No evidence of neural foraminal
stenosis. No central canal stenosis.

L3-L4: No significant disc bulge. No evidence of neural foraminal
stenosis. No central canal stenosis.

L4-L5: Broad-based disc bulge with a broad central disc protrusion.
Bilateral lateral recess stenosis. Mild bilateral facet arthropathy,
left greater than right. Mild spinal stenosis. No evidence of neural
foraminal stenosis.

L5-S1: Broad-based disc bulge. Mild bilateral facet arthropathy. No
evidence of neural foraminal stenosis. No central canal stenosis.
IMPRESSION: 1. At L4-5 there is a broad based disc bulge with a broad central
disc protrusion. Bilateral lateral recess stenosis. Mild bilateral
facet arthropathy, left greater than right. Mild spinal stenosis.
2. At L5-S1 there is broad-based disc bulge. Mild bilateral facet
arthropathy.

## 2017-11-25 MED FILL — $LANTUS 100 UNITS/ML VIAL: 100 | 25 days supply | Qty: 10 | Fill #3

## 2017-11-25 MED FILL — FENOFIBRATE 145 MG TABLET: 145 | 30 days supply | Qty: 30 | Fill #2

## 2017-11-25 MED FILL — ATORVASTATIN 20 MG TABLET: 20 | 30 days supply | Qty: 30 | Fill #1

## 2017-12-05 ENCOUNTER — Ambulatory Visit: Payer: Self-pay | Admitting: Nurse Practitioner

## 2017-12-05 ENCOUNTER — Encounter: Payer: Self-pay | Admitting: Physical Medicine & Rehabilitation

## 2017-12-19 ENCOUNTER — Other Ambulatory Visit: Payer: Self-pay | Admitting: Internal Medicine

## 2017-12-19 DIAGNOSIS — Z794 Long term (current) use of insulin: Principal | ICD-10-CM

## 2017-12-19 DIAGNOSIS — IMO0001 Reserved for inherently not codable concepts without codable children: Secondary | ICD-10-CM

## 2017-12-19 DIAGNOSIS — E1165 Type 2 diabetes mellitus with hyperglycemia: Principal | ICD-10-CM

## 2017-12-19 MED FILL — !HUMALOG 100 UNITS/ML KWIKP: 100 | 30 days supply | Qty: 18 | Fill #2

## 2017-12-19 MED FILL — $JANUMET 50-1000 MG TABLET: 50-1000 | 30 days supply | Qty: 60 | Fill #2

## 2017-12-20 ENCOUNTER — Other Ambulatory Visit: Payer: Self-pay | Admitting: Internal Medicine

## 2017-12-20 DIAGNOSIS — E1165 Type 2 diabetes mellitus with hyperglycemia: Principal | ICD-10-CM

## 2017-12-20 DIAGNOSIS — Z794 Long term (current) use of insulin: Principal | ICD-10-CM

## 2017-12-20 DIAGNOSIS — IMO0001 Reserved for inherently not codable concepts without codable children: Secondary | ICD-10-CM

## 2017-12-20 MED FILL — ATORVASTATIN 20 MG TABLET: 20 | 30 days supply | Qty: 30 | Fill #2

## 2017-12-20 MED FILL — FENOFIBRATE 145 MG TABLET: 145 | 30 days supply | Qty: 30 | Fill #3

## 2017-12-22 ENCOUNTER — Encounter: Payer: Self-pay | Admitting: Physical Medicine & Rehabilitation

## 2017-12-22 ENCOUNTER — Ambulatory Visit (HOSPITAL_BASED_OUTPATIENT_CLINIC_OR_DEPARTMENT_OTHER): Payer: No Typology Code available for payment source | Admitting: Physical Medicine & Rehabilitation

## 2017-12-22 ENCOUNTER — Encounter: Payer: No Typology Code available for payment source | Attending: Physical Medicine & Rehabilitation

## 2017-12-22 VITALS — BP 121/72 | HR 96 | Ht 66.0 in | Wt 188.0 lb

## 2017-12-22 DIAGNOSIS — R202 Paresthesia of skin: Secondary | ICD-10-CM | POA: Insufficient documentation

## 2017-12-22 DIAGNOSIS — F17210 Nicotine dependence, cigarettes, uncomplicated: Secondary | ICD-10-CM | POA: Insufficient documentation

## 2017-12-22 DIAGNOSIS — M5136 Other intervertebral disc degeneration, lumbar region: Secondary | ICD-10-CM

## 2017-12-22 DIAGNOSIS — M47812 Spondylosis without myelopathy or radiculopathy, cervical region: Secondary | ICD-10-CM

## 2017-12-22 DIAGNOSIS — M7701 Medial epicondylitis, right elbow: Secondary | ICD-10-CM | POA: Insufficient documentation

## 2017-12-22 MED ORDER — DICLOFENAC SODIUM 1 % TD GEL: 2.0000 g | Freq: Four times a day (QID) | TRANSDERMAL | 5 refills | Status: DC
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
## 2017-12-22 NOTE — Patient Instructions (Addendum)
[facility] Ask your health care provider which exercises are safe for you. Do exercises exactly as told by your health care provider and adjust them as directed. It is normal to feel mild stretching, pulling, tightness, or discomfort as you do these exercises, but you should stop right away if you feel sudden pain or your pain gets worse. Do not begin these exercises until told by your health care provider. Stretching and range of motion exercises These exercises warm up your muscles and joints and improve the movement and flexibility of your elbow. Exercise A: Wrist flexors  1. Straighten your left / right elbow in front of you with your palm up. If told by your health care provider, do this stretch with your elbow bent rather than straight. 2. With your other hand, gently pull your left / right hand and fingers toward you until you feel a gentle stretch on the top of your forearm. 3. Hold this position for __________ seconds. Repeat __________ times. Complete this exercise __________ times a day. Strengthening exercises These exercises build strength and endurance in your elbow. Endurance is the ability to use your muscles for a long time, even after several repetitions. Exercise B: Wrist flexion  1. Sit with your left / right forearm palm-up and supported on a table or other surface. 2. Let your left / right wrist extend over the edge of the surface. 3. Hold a __________ weight or a piece of rubber exercise band or tubing. Slowly curl your hand up toward your forearm. Try to only move your hand and keep the rest of your arm still. 4. Hold this position for __________ seconds. 5. Slowly return to the starting position. Repeat __________ times. Complete this exercise __________ times a day. Exercise C: Eccentric wrist flexion 1. Sit with your left / right forearm palm-up and supported on a table or other surface. 2. Let your left / right wrist extend over the edge of the  surface. 3. Hold a __________ weight or a piece of rubber exercise band or tubing. 4. Use your other hand to move your left / right hand up toward your forearm. 5. Slowly return to the starting position using only your left / right hand. Repeat __________ times. Complete this exercise __________ times a day. Exercise D: Hand turns, pronation i 1. Sit with your left / right forearm supported on a table or other surface. 2. Move your wrist so your pinkie travels toward your forearm and your thumb moves away from your forearm. 3. Hold this position for __________ seconds. 4. Slowly return to the starting position. Exercise E: Hand turns, pronation ii  1. Sit with your left / right forearm supported on a table or other surface. 2. Hold a hammer in your left / right hand. ? The exercise will be easier if you hold on near the head of the hammer. ? If you hold on toward the end of the handle, the exercise will be harder. 3. Rest your left / right hand over the edge of the table with your palm facing up. 4. Without moving your elbow, slowly turn your palm and your hand down toward the table. 5. Hold this position for __________ seconds. 6. Slowly return to the starting position. Repeat __________ times. Complete this exercise __________ times a day. Exercise F: Shoulder blade squeeze 1. Sit in a stable chair with good posture. Do not let your back touch the back of the chair. 2. Your arms should be at your   sides with your elbows bent. You can rest your forearms on a pillow. 3. Squeeze your shoulder blades together. Keep your shoulders level. Do not lift your shoulders up toward your ears. 4. Hold this position for __________ seconds. 5. Slowly release. Repeat __________ times. Complete this exercise __________ times a day. This information is not intended to replace advice given to you by your health care provider. Make sure you discuss any questions you have with your health care  provider. Document Released: 05/31/2005 Document Revised: 02/05/2016 Document Reviewed: 02/10/2015 Elsevier Interactive Patient Education  2018 [employer] Maximum 3 times a day for Ibuprofen and cysclobenzaprine  May take 2 tylenol per day if needed  New cream is for Right elbow pain  Numbness and tingling in hands and feet is from diabetes  I do not recommend surgery for your neck or back  Please stay as active as possible , walk every day 30 minutes, No restrictions recommended

## 2018-01-13 MED FILL — $LANTUS SOLOSTAR 100 UNITS/: 100 | 30 days supply | Qty: 12 | Fill #0

## 2018-01-26 MED FILL — !HUMALOG 100 UNITS/ML KWIKP: 100 | 30 days supply | Qty: 18 | Fill #3

## 2018-01-26 MED FILL — $JANUMET 50-1000 MG TABLET: 50-1000 | 30 days supply | Qty: 60 | Fill #3

## 2018-01-26 MED FILL — TRUEplus 5-BEVEL PEN NEEDLE: 31G X 8 MM | 30 days supply | Qty: 100 | Fill #1

## 2018-01-26 MED FILL — ATORVASTATIN 20 MG TABLET: 20 | 30 days supply | Qty: 30 | Fill #3

## 2018-01-26 MED FILL — FENOFIBRATE 145 MG TAB: 145 | 30 days supply | Qty: 30 | Fill #4

## 2018-03-01 MED FILL — CYCLOBENZAPRINE 10 MG TAB: 10 | 20 days supply | Qty: 60 | Fill #1

## 2018-03-01 MED FILL — ATORVASTATIN 20 MG TABLET: 20 | 30 days supply | Qty: 30 | Fill #4

## 2018-03-01 MED FILL — $LANTUS SOLOSTAR 100 UNITS/: 100 | 30 days supply | Qty: 12 | Fill #1

## 2018-03-01 MED FILL — FENOFIBRATE 145 MG TABLET: 145 | 30 days supply | Qty: 30 | Fill #5

## 2018-03-27 ENCOUNTER — Ambulatory Visit: Payer: No Typology Code available for payment source | Admitting: Nurse Practitioner

## 2018-03-30 ENCOUNTER — Ambulatory Visit: Payer: Self-pay | Attending: Nurse Practitioner | Admitting: Physician Assistant

## 2018-03-30 VITALS — BP 112/78 | HR 65 | Temp 98.4°F | Resp 18 | Ht 66.0 in | Wt 182.0 lb

## 2018-03-30 DIAGNOSIS — M542 Cervicalgia: Secondary | ICD-10-CM | POA: Insufficient documentation

## 2018-03-30 DIAGNOSIS — H539 Unspecified visual disturbance: Secondary | ICD-10-CM

## 2018-03-30 DIAGNOSIS — Z9119 Patient's noncompliance with other medical treatment and regimen: Secondary | ICD-10-CM

## 2018-03-30 DIAGNOSIS — M549 Dorsalgia, unspecified: Secondary | ICD-10-CM | POA: Insufficient documentation

## 2018-03-30 DIAGNOSIS — Z9111 Patient's noncompliance with dietary regimen: Secondary | ICD-10-CM | POA: Insufficient documentation

## 2018-03-30 DIAGNOSIS — Z7982 Long term (current) use of aspirin: Secondary | ICD-10-CM | POA: Insufficient documentation

## 2018-03-30 DIAGNOSIS — Z79899 Other long term (current) drug therapy: Secondary | ICD-10-CM | POA: Insufficient documentation

## 2018-03-30 DIAGNOSIS — Z760 Encounter for issue of repeat prescription: Secondary | ICD-10-CM | POA: Insufficient documentation

## 2018-03-30 DIAGNOSIS — E1165 Type 2 diabetes mellitus with hyperglycemia: Secondary | ICD-10-CM | POA: Insufficient documentation

## 2018-03-30 DIAGNOSIS — Z794 Long term (current) use of insulin: Secondary | ICD-10-CM | POA: Insufficient documentation

## 2018-03-30 DIAGNOSIS — Z91199 Patient's noncompliance with other medical treatment and regimen due to unspecified reason: Secondary | ICD-10-CM

## 2018-03-30 DIAGNOSIS — Z7984 Long term (current) use of oral hypoglycemic drugs: Secondary | ICD-10-CM | POA: Insufficient documentation

## 2018-03-30 DIAGNOSIS — E11319 Type 2 diabetes mellitus with unspecified diabetic retinopathy without macular edema: Secondary | ICD-10-CM | POA: Insufficient documentation

## 2018-03-30 DIAGNOSIS — G8929 Other chronic pain: Secondary | ICD-10-CM | POA: Insufficient documentation

## 2018-03-30 DIAGNOSIS — Z91128 Patient's intentional underdosing of medication regimen for other reason: Secondary | ICD-10-CM | POA: Insufficient documentation

## 2018-03-30 DIAGNOSIS — R937 Abnormal findings on diagnostic imaging of other parts of musculoskeletal system: Secondary | ICD-10-CM | POA: Insufficient documentation

## 2018-03-30 DIAGNOSIS — E559 Vitamin D deficiency, unspecified: Secondary | ICD-10-CM | POA: Insufficient documentation

## 2018-03-30 DIAGNOSIS — E782 Mixed hyperlipidemia: Secondary | ICD-10-CM | POA: Insufficient documentation

## 2018-03-30 LAB — POCT URINALYSIS DIP (CLINITEK)
BILIRUBIN UA: NEGATIVE mg/dL
Bilirubin, UA: NEGATIVE
Glucose, UA: 500 mg/dL — AB
Leukocytes, UA: NEGATIVE
Nitrite, UA: NEGATIVE
POC,PROTEIN,UA: NEGATIVE
RBC UA: NEGATIVE
SPEC GRAV UA: 1.01 (ref 1.010–1.025)
Urobilinogen, UA: 0.2 E.U./dL
pH, UA: 6.5 (ref 5.0–8.0)

## 2018-03-30 LAB — POCT GLYCOSYLATED HEMOGLOBIN (HGB A1C): HBA1C, POC (CONTROLLED DIABETIC RANGE): 11.3 % — AB (ref 0.0–7.0)

## 2018-03-30 LAB — GLUCOSE, POCT (MANUAL RESULT ENTRY)
POC GLUCOSE: 487 mg/dL — AB (ref 70–99)
POC Glucose: 473 mg/dl — AB (ref 70–99)

## 2018-03-30 MED ORDER — INSULIN LISPRO 100 UNIT/ML (KWIKPEN): 20.0000 [IU] | PEN_INJECTOR | Freq: Three times a day (TID) | SUBCUTANEOUS | 11 refills | Status: DC

## 2018-03-30 MED ORDER — FENOFIBRATE 145 MG PO TABS
145.0000 mg | ORAL_TABLET | Freq: Every day | ORAL | 3 refills | Status: DC
Start: 2018-03-30 — End: 2018-11-13

## 2018-03-30 MED ORDER — INSULIN PEN NEEDLE 31G X 5 MM MISC: 1 refills | Status: DC

## 2018-03-30 MED ORDER — INSULIN ASPART 100 UNIT/ML ~~LOC~~ SOLN
30.0000 [IU] | Freq: Once | SUBCUTANEOUS | Status: AC
  Administered 2018-03-30: 30 [IU] via SUBCUTANEOUS

## 2018-03-30 MED ORDER — ASPIRIN EC 81 MG PO TBEC: 81.0000 mg | DELAYED_RELEASE_TABLET | Freq: Every day | ORAL | 2 refills | Status: AC

## 2018-03-30 MED ORDER — INSULIN GLARGINE 100 UNIT/ML SOLOSTAR PEN: 40.0000 [IU] | PEN_INJECTOR | Freq: Every day | SUBCUTANEOUS | 11 refills | Status: DC

## 2018-03-30 MED ORDER — SITAGLIPTIN-METFORMIN HCL 50-1000 MG PO TABS
1.0000 | ORAL_TABLET | Freq: Two times a day (BID) | ORAL | 3 refills | Status: DC
Start: 2018-03-30 — End: 2018-11-13

## 2018-03-30 MED ORDER — DICLOFENAC SODIUM 1 % TD GEL: 2.0000 g | Freq: Four times a day (QID) | TRANSDERMAL | 5 refills | Status: DC

## 2018-03-30 MED ORDER — IBUPROFEN 800 MG PO TABS: 800.0000 mg | ORAL_TABLET | Freq: Three times a day (TID) | ORAL | 1 refills | Status: DC | PRN

## 2018-03-30 MED ORDER — ATORVASTATIN CALCIUM 20 MG PO TABS: 20.0000 mg | ORAL_TABLET | Freq: Every day | ORAL | 3 refills | Status: DC

## 2018-03-30 MED ORDER — CYCLOBENZAPRINE HCL 10 MG PO TABS: 10.0000 mg | ORAL_TABLET | Freq: Three times a day (TID) | ORAL | 1 refills | Status: DC | PRN

## 2018-03-30 MED FILL — $HUMALOG 100 UNITS/ML KWIKP: 100 | 90 days supply | Qty: 54 | Fill #4

## 2018-03-30 MED FILL — $LANTUS SOLOSTAR 100 UNITS/: 100 | 30 days supply | Qty: 12 | Fill #2

## 2018-03-30 MED FILL — FENOFIBRATE 145 MG TABLET: 145 | 30 days supply | Qty: 30 | Fill #0

## 2018-03-30 MED FILL — ATORVASTATIN 20 MG TABLET: 20 | 30 days supply | Qty: 30 | Fill #0

## 2018-03-30 MED FILL — $JANUMET 50-1000 MG TABLET: 50-1000 | 30 days supply | Qty: 60 | Fill #0

## 2018-03-30 NOTE — Patient Instructions (Addendum)
Check blood sugars fasting and with meals and keep record.  Bring to next visit.  Eliminate sugars/white carbohysdrates from your diet.

## 2018-03-30 NOTE — Progress Notes (Signed)
Patient ID: Christian Navarro, male   DOB: 27-Mar-1961, 57 y.o.   MRN: [id number]      Christian Navarro, is a 57 y.o. male  [id number]  [id number]  DOB - [birth year]  Subjective:  Chief Complaint and HPI: Christian Navarro is a 57 y.o. male here today for med RF.  Not checking sugars regularly.  He has been taking Novolog only once a day at 20 units.  He has only been taking Lantus 30 units at bedtime.  He is not following diabetic diet.    He has been having blurry vision for a few weeks.  He has not seen an opthalmologist in a while.    Wants to review MRI neck and back and wants referral.  No new weakness/radiculopathy but pain is significant daily.     ROS:   Constitutional:  No f/c, No night sweats, No unexplained weight loss. EENT:  No vision changes, + blurry vision, No hearing changes. No mouth, throat, or ear problems.  Respiratory: No cough, No SOB Cardiac: No CP, no palpitations GI:  No abd pain, No N/V/D. GU: No Urinary s/sx Musculoskeletal: +back and neck pain Neuro: No headache, no dizziness, no motor weakness.  Skin: No rash Endocrine:  No polydipsia. No polyuria.  Psych: Denies SI/HI  No problems updated.  ALLERGIES: No Known Allergies  PAST MEDICAL HISTORY: Past Medical History:  Diagnosis Date  . Diabetes mellitus   . Hyperlipidemia     MEDICATIONS AT HOME: Prior to Admission medications   Medication Sig Start Date End Date Taking? Authorizing Provider  aspirin EC 81 MG tablet Take 1 tablet (81 mg total) by mouth daily. 03/30/18   Argentina Donovan, PA-C  atorvastatin (LIPITOR) 20 MG tablet Take 1 tablet (20 mg total) by mouth daily. 03/30/18   Argentina Donovan, PA-C  Blood Glucose Monitoring Suppl (TRUE METRIX METER) w/Device KIT 1 each by Does not apply route 3 (three) times daily. 09/29/15   Maren Reamer, MD  cyclobenzaprine (FLEXERIL) 10 MG tablet Take 1 tablet (10 mg total) by mouth 3 (three) times daily as needed for muscle spasms. 03/30/18    Argentina Donovan, PA-C  diclofenac sodium (VOLTAREN) 1 % GEL Apply 2 g topically 4 (four) times daily. 03/30/18   Argentina Donovan, PA-C  fenofibrate (TRICOR) 145 MG tablet Take 1 tablet (145 mg total) by mouth daily. 03/30/18 03/30/19  Argentina Donovan, PA-C  glucose blood (TRUE METRIX BLOOD GLUCOSE TEST) test strip Use as instructed 09/29/15   Lottie Mussel T, MD  ibuprofen (ADVIL,MOTRIN) 800 MG tablet Take 1 tablet (800 mg total) by mouth every 8 (eight) hours as needed. 03/30/18   Argentina Donovan, PA-C  Insulin Glargine (LANTUS) 100 UNIT/ML Solostar Pen Inject 40 Units into the skin daily at 10 pm. 03/30/18 04/29/18  Argentina Donovan, PA-C  insulin lispro (HUMALOG) 100 UNIT/ML KiwkPen Inject 0.2 mLs (20 Units total) into the skin 3 (three) times daily. 03/30/18 04/29/18  Argentina Donovan, PA-C  Insulin Pen Needle (B-D UF III MINI PEN NEEDLES) 31G X 5 MM MISC Use as instructed 03/30/18   Argentina Donovan, PA-C  sitaGLIPtin-metformin (JANUMET) 50-1000 MG tablet Take 1 tablet by mouth 2 (two) times daily with a meal. 03/30/18 04/29/18  Lin Glazier, Dionne Bucy, PA-C  tolnaftate (TINACTIN) 1 % powder Apply 1 application topically 2 (two) times daily. Apply to bilateral toes, keep dry. 09/02/17   Gildardo Pounds, NP  TRUEPLUS LANCETS 28G MISC 1 each by  Does not apply route 3 (three) times daily. 09/29/15   Langeland, Dawn T, MD  Vitamin D, Ergocalciferol, (DRISDOL) 50000 units CAPS capsule Take 1 capsule (50,000 Units total) by mouth every 7 (seven) days. 10/05/16   Langeland, Dawn T, MD     Objective:  EXAM:   Vitals:   03/30/18 1358  BP: 112/78  Pulse: 65  Resp: 18  Temp: 98.4 F (36.9 C)  TempSrc: Oral  SpO2: 100%  Weight: 182 lb (82.6 kg)  Height: 5' 6" (1.676 m)    General appearance : A&OX3. NAD. Non-toxic-appearing HEENT: Atraumatic and Normocephalic.  PERRLA. EOM intact.  Neck: supple, no JVD. No cervical lymphadenopathy. No thyromegaly Chest/Lungs:  Breathing-non-labored,  Good air entry bilaterally, breath sounds normal without rales, rhonchi, or wheezing  CVS: S1 S2 regular, no murmurs, gallops, rubs  Extremities: Bilateral Lower Ext shows no edema, both legs are warm to touch with = pulse throughout Neurology:  CN II-XII grossly intact, Non focal.   Psych:  TP linear. J/I WNL. Normal speech. Appropriate eye contact and affect.  Skin:  No Rash  Data Review Lab Results  Component Value Date   HGBA1C 11.3 (A) 03/30/2018   HGBA1C 10.6 09/02/2017   HGBA1C 10.4 11/01/2016     Assessment & Plan   1. Type 2 diabetes mellitus with retinopathy of both eyes, with long-term current use of insulin, macular edema presence unspecified, unspecified retinopathy severity (HCC) Uncontrolled.  Must adhere to actual regimen-he was not taking meds at doses prescribed.  Resume prescribed regimen.  Check blood sugars fasting and with meals and keep log.  Compliance and diabetic diet imperative.  Spent >30mins face to face.   - Glucose (CBG) - HgB A1c - Insulin Glargine (LANTUS) 100 UNIT/ML Solostar Pen; Inject 40 Units into the skin daily at 10 pm.  Dispense: 15 mL; Refill: 11 - insulin lispro (HUMALOG) 100 UNIT/ML KiwkPen; Inject 0.2 mLs (20 Units total) into the skin 3 (three) times daily.  Dispense: 15 mL; Refill: 11 - sitaGLIPtin-metformin (JANUMET) 50-1000 MG tablet; Take 1 tablet by mouth 2 (two) times daily with a meal.  Dispense: 60 tablet; Refill: 3 - Comprehensive metabolic panel - insulin aspart (novoLOG) injection 30 Units - Ambulatory referral to Ophthalmology  2. Mixed hyperlipidemia - atorvastatin (LIPITOR) 20 MG tablet; Take 1 tablet (20 mg total) by mouth daily.  Dispense: 90 tablet; Refill: 3 - fenofibrate (TRICOR) 145 MG tablet; Take 1 tablet (145 mg total) by mouth daily.  Dispense: 90 tablet; Refill: 3  3. Chronic neck and back pain - cyclobenzaprine (FLEXERIL) 10 MG tablet; Take 1 tablet (10 mg total) by mouth 3 (three) times daily as needed for  muscle spasms.  Dispense: 60 tablet; Refill: 1 - ibuprofen (ADVIL,MOTRIN) 800 MG tablet; Take 1 tablet (800 mg total) by mouth every 8 (eight) hours as needed.  Dispense: 30 tablet; Refill: 1 - Ambulatory referral to Neurosurgery  4. Vitamin D deficiency - Vitamin D, 25-hydroxy  5. Abnormal MRI, cervical spine - Ambulatory referral to Neurosurgery  6. Abnormal MRI, spine - Ambulatory referral to Neurosurgery  7. Vision changes Due to uncontrolled diabetes.   - Ambulatory referral to Ophthalmology  Spent >30mins face to face counseling on diabetic diet, compliance, etc.    Patient have been counseled extensively about nutrition and exercise  Return in about 3 weeks (around 04/20/2018) for with Luke for medication management; 3 mths with Zelda Fleming.  The patient was given clear instructions to go to ER or return   to medical center if symptoms don't improve, worsen or new problems develop. The patient verbalized understanding. The patient was told to call to get lab results if they haven't heard anything in the next week.     Angela McClung, PA-C [facility] [place], [place] [phone]   03/30/2018, 2:08 PM 

## 2018-05-09 MED FILL — FENOFIBRATE 145 MG TABLET: 145 | 30 days supply | Qty: 30 | Fill #1

## 2018-05-09 MED FILL — TRUEPLUS PEN NDL 31GX3/16: 31G X 5 MM | 25 days supply | Qty: 100 | Fill #0

## 2018-05-09 MED FILL — ATORVASTATIN 20 MG TABLET: 20 | 30 days supply | Qty: 30 | Fill #1

## 2018-05-09 MED FILL — $LANTUS SOLOSTAR 100 UNITS/: 100 | 30 days supply | Qty: 12 | Fill #0

## 2018-05-09 MED FILL — DICLOFENAC SODIUM 1% GEL: 1 | 12 days supply | Qty: 100 | Fill #0

## 2018-05-09 MED FILL — TRUEPLUS PEN NDL 31GX3/16": 31G X 5 MM | 25 days supply | Qty: 100 | Fill #0

## 2018-05-09 MED FILL — IBUPROFEN 800 MG TABLET: 800 | 10 days supply | Qty: 30 | Fill #0

## 2018-05-15 ENCOUNTER — Emergency Department (HOSPITAL_BASED_OUTPATIENT_CLINIC_OR_DEPARTMENT_OTHER)
Admission: EM | Admit: 2018-05-15 | Discharge: 2018-05-15 | Disposition: A | Discharge status: Home / Self Care | Payer: No Typology Code available for payment source | Attending: Emergency Medicine | Admitting: Emergency Medicine

## 2018-05-15 ENCOUNTER — Emergency Department (HOSPITAL_BASED_OUTPATIENT_CLINIC_OR_DEPARTMENT_OTHER): Payer: No Typology Code available for payment source

## 2018-05-15 ENCOUNTER — Encounter (HOSPITAL_BASED_OUTPATIENT_CLINIC_OR_DEPARTMENT_OTHER): Payer: Self-pay | Admitting: Emergency Medicine

## 2018-05-15 ENCOUNTER — Other Ambulatory Visit: Payer: Self-pay

## 2018-05-15 DIAGNOSIS — S199XXA Unspecified injury of neck, initial encounter: Secondary | ICD-10-CM | POA: Diagnosis present

## 2018-05-15 DIAGNOSIS — Y92410 Unspecified street and highway as the place of occurrence of the external cause: Secondary | ICD-10-CM | POA: Diagnosis not present

## 2018-05-15 DIAGNOSIS — Y999 Unspecified external cause status: Secondary | ICD-10-CM | POA: Diagnosis not present

## 2018-05-15 DIAGNOSIS — F17210 Nicotine dependence, cigarettes, uncomplicated: Secondary | ICD-10-CM | POA: Insufficient documentation

## 2018-05-15 DIAGNOSIS — S20219A Contusion of unspecified front wall of thorax, initial encounter: Secondary | ICD-10-CM | POA: Diagnosis not present

## 2018-05-15 DIAGNOSIS — Z794 Long term (current) use of insulin: Secondary | ICD-10-CM | POA: Insufficient documentation

## 2018-05-15 DIAGNOSIS — R1084 Generalized abdominal pain: Secondary | ICD-10-CM | POA: Insufficient documentation

## 2018-05-15 DIAGNOSIS — E119 Type 2 diabetes mellitus without complications: Secondary | ICD-10-CM | POA: Diagnosis not present

## 2018-05-15 DIAGNOSIS — S161XXA Strain of muscle, fascia and tendon at neck level, initial encounter: Secondary | ICD-10-CM | POA: Insufficient documentation

## 2018-05-15 DIAGNOSIS — Y939 Activity, unspecified: Secondary | ICD-10-CM | POA: Diagnosis not present

## 2018-05-15 DIAGNOSIS — Z7982 Long term (current) use of aspirin: Secondary | ICD-10-CM | POA: Insufficient documentation

## 2018-05-15 DIAGNOSIS — Z79899 Other long term (current) drug therapy: Secondary | ICD-10-CM | POA: Diagnosis not present

## 2018-05-15 DIAGNOSIS — R911 Solitary pulmonary nodule: Secondary | ICD-10-CM | POA: Diagnosis not present

## 2018-05-15 HISTORY — DX: Low back pain: M54.5

## 2018-05-15 HISTORY — DX: Dorsalgia, unspecified: M54.9

## 2018-05-15 HISTORY — DX: Low back pain, unspecified: M54.50

## 2018-05-15 HISTORY — DX: Other chronic pain: G89.29

## 2018-05-15 HISTORY — DX: Other intervertebral disc degeneration, lumbar region without mention of lumbar back pain or lower extremity pain: M51.369

## 2018-05-15 HISTORY — DX: Cervicalgia: M54.2

## 2018-05-15 HISTORY — DX: Other intervertebral disc degeneration, lumbar region: M51.36

## 2018-05-15 HISTORY — DX: Herpesviral infection of urogenital system, unspecified: A60.00

## 2018-05-15 LAB — COMPREHENSIVE METABOLIC PANEL
ALT: 21 U/L (ref 0–44)
AST: 20 U/L (ref 15–41)
Albumin: 3.9 g/dL (ref 3.5–5.0)
Alkaline Phosphatase: 57 U/L (ref 38–126)
Anion gap: 7 (ref 5–15)
BUN: 23 mg/dL — ABNORMAL HIGH (ref 6–20)
CO2: 26 mmol/L (ref 22–32)
Calcium: 9.1 mg/dL (ref 8.9–10.3)
Chloride: 104 mmol/L (ref 98–111)
Creatinine, Ser: 1.31 mg/dL — ABNORMAL HIGH (ref 0.61–1.24)
GFR calc non Af Amer: >60 mL/min (ref >60)
Glucose, Bld: 151 mg/dL — ABNORMAL HIGH (ref 70–99)
Potassium: 3.6 mmol/L (ref 3.5–5.1)
SODIUM: 137 mmol/L (ref 135–145)
Total Bilirubin: 0.5 mg/dL (ref 0.3–1.2)
Total Protein: 7.2 g/dL (ref 6.5–8.1)

## 2018-05-15 LAB — CBC WITH DIFFERENTIAL/PLATELET
Abs Immature Granulocytes: 0.06 10*3/uL (ref 0.00–0.07)
Basophils Absolute: 0 10*3/uL (ref 0.0–0.1)
Basophils Relative: 0 %
Eosinophils Absolute: 0.2 10*3/uL (ref 0.0–0.5)
Eosinophils Relative: 3 %
HCT: 44.8 % (ref 39.0–52.0)
Hemoglobin: 14.3 g/dL (ref 13.0–17.0)
Immature Granulocytes: 1 %
LYMPHS PCT: 44 %
Lymphs Abs: 3 10*3/uL (ref 0.7–4.0)
MCH: 28 pg (ref 26.0–34.0)
MCHC: 31.9 g/dL (ref 30.0–36.0)
MCV: 87.8 fL (ref 80.0–100.0)
Monocytes Absolute: 0.6 10*3/uL (ref 0.1–1.0)
Monocytes Relative: 9 %
Neutro Abs: 3 10*3/uL (ref 1.7–7.7)
Neutrophils Relative %: 43 %
Platelets: 263 10*3/uL (ref 150–400)
RBC: 5.1 MIL/uL (ref 4.22–5.81)
RDW: 13 % (ref 11.5–15.5)
WBC: 6.9 10*3/uL (ref 4.0–10.5)
nRBC: 0 % (ref 0.0–0.2)

## 2018-05-15 LAB — TROPONIN I: Troponin I: <0.03 ng/mL (ref <0.03)

## 2018-05-15 MED ORDER — IOPAMIDOL (ISOVUE-300) INJECTION 61%
100.0000 mL | Freq: Once | INTRAVENOUS | Status: AC | PRN
  Administered 2018-05-15: 100 mL via INTRAVENOUS

## 2018-05-15 MED ORDER — FENTANYL CITRATE (PF) 100 MCG/2ML IJ SOLN
50.0000 ug | Freq: Once | INTRAMUSCULAR | Status: AC
  Administered 2018-05-15: 50 ug via INTRAVENOUS
  Filled 2018-05-15: qty 2

## 2018-05-15 MED ORDER — SODIUM CHLORIDE 0.9 % IV BOLUS
500.0000 mL | Freq: Once | INTRAVENOUS | Status: AC
  Administered 2018-05-15: 500 mL via INTRAVENOUS

## 2018-05-15 MED ORDER — CYCLOBENZAPRINE HCL 10 MG PO TABS: 10.0000 mg | ORAL_TABLET | Freq: Two times a day (BID) | ORAL | 0 refills | Status: DC | PRN
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
## 2018-05-15 NOTE — ED Notes (Signed)
ED Provider at bedside. 

## 2018-06-16 MED FILL — $LANTUS SOLOSTAR 100 UNITS/: 100 | 30 days supply | Qty: 12 | Fill #1

## 2018-06-16 MED FILL — ATORVASTATIN 20 MG TABLET: 20 | 30 days supply | Qty: 30 | Fill #2

## 2018-06-16 MED FILL — FENOFIBRATE 145 MG TABLET: 145 | 30 days supply | Qty: 30 | Fill #2

## 2018-08-01 MED FILL — $JANUMET 50-1000 MG TABLET: 50-1000 | 90 days supply | Qty: 180 | Fill #1

## 2018-08-01 MED FILL — FENOFIBRATE 145 MG TABLET: 145 | 30 days supply | Qty: 30 | Fill #3

## 2018-08-01 MED FILL — ATORVASTATIN 20 MG TABLET: 20 | 30 days supply | Qty: 30 | Fill #3

## 2018-08-01 MED FILL — $LANTUS SOLOSTAR 100 UNITS/: 100 | 90 days supply | Qty: 36 | Fill #2

## 2018-08-04 MED FILL — GABAPENTIN 300 MG CAPSULE: 300 | 30 days supply | Qty: 30 | Fill #0

## 2018-08-04 MED FILL — MELOXICAM 15 MG TABLET: 15 | 30 days supply | Qty: 30 | Fill #0

## 2018-08-15 ENCOUNTER — Ambulatory Visit: Payer: No Typology Code available for payment source | Admitting: Family Medicine

## 2018-08-16 ENCOUNTER — Telehealth: Payer: Self-pay | Admitting: *Deleted

## 2018-08-16 NOTE — Telephone Encounter (Signed)
Patient no showed for their most recent appointment 08/15/2018. Patient states he forgot about the appointment and was rescheduled for 09/15/2018 at 2:10pm.

## 2018-08-25 MED FILL — $HUMALOG 100 UNITS/ML KWIKP: 100 | 35 days supply | Qty: 21 | Fill #5

## 2018-09-01 MED FILL — FENOFIBRATE 145 MG TABLET: 145 | 30 days supply | Qty: 30 | Fill #4

## 2018-09-01 MED FILL — ATORVASTATIN 20 MG TABLET: 20 | 30 days supply | Qty: 30 | Fill #4

## 2018-09-15 ENCOUNTER — Ambulatory Visit: Payer: No Typology Code available for payment source | Admitting: Nurse Practitioner

## 2018-09-23 NOTE — Telephone Encounter (Signed)
done

## 2018-09-26 ENCOUNTER — Ambulatory Visit: Payer: No Typology Code available for payment source | Admitting: Nurse Practitioner

## 2018-09-26 ENCOUNTER — Other Ambulatory Visit: Payer: Self-pay

## 2018-10-02 NOTE — Progress Notes (Signed)
This encounter was created in error - please disregard.

## 2018-10-13 MED FILL — ?ATORVASTATIN 20 MG TABLET: 20 | 30 days supply | Qty: 30 | Fill #5

## 2018-10-13 MED FILL — ?FENOFIBRATE 145 MG TABLET: 145 | 30 days supply | Qty: 30 | Fill #5

## 2018-11-03 ENCOUNTER — Telehealth: Payer: Self-pay | Admitting: *Deleted

## 2018-11-03 ENCOUNTER — Other Ambulatory Visit: Payer: Self-pay | Admitting: Nurse Practitioner

## 2018-11-03 DIAGNOSIS — IMO0001 Reserved for inherently not codable concepts without codable children: Secondary | ICD-10-CM

## 2018-11-03 NOTE — Telephone Encounter (Signed)
Opened in Error.

## 2018-11-13 ENCOUNTER — Other Ambulatory Visit: Payer: Self-pay

## 2018-11-13 ENCOUNTER — Encounter: Payer: Self-pay | Admitting: Nurse Practitioner

## 2018-11-13 ENCOUNTER — Ambulatory Visit: Payer: Self-pay | Attending: Family Medicine | Admitting: Nurse Practitioner

## 2018-11-13 VITALS — BP 110/66 | HR 82 | Temp 97.6°F | Ht 66.0 in | Wt 184.0 lb

## 2018-11-13 DIAGNOSIS — E782 Mixed hyperlipidemia: Secondary | ICD-10-CM

## 2018-11-13 DIAGNOSIS — M542 Cervicalgia: Secondary | ICD-10-CM

## 2018-11-13 DIAGNOSIS — E11319 Type 2 diabetes mellitus with unspecified diabetic retinopathy without macular edema: Secondary | ICD-10-CM

## 2018-11-13 DIAGNOSIS — IMO0001 Reserved for inherently not codable concepts without codable children: Secondary | ICD-10-CM

## 2018-11-13 DIAGNOSIS — E1165 Type 2 diabetes mellitus with hyperglycemia: Secondary | ICD-10-CM

## 2018-11-13 DIAGNOSIS — Z794 Long term (current) use of insulin: Secondary | ICD-10-CM

## 2018-11-13 LAB — POCT GLYCOSYLATED HEMOGLOBIN (HGB A1C): Hemoglobin A1C: 10.3 % — AB (ref 4.0–5.6)

## 2018-11-13 LAB — GLUCOSE, POCT (MANUAL RESULT ENTRY): POC Glucose: 118 mg/dl — AB (ref 70–99)

## 2018-11-13 MED ORDER — LISINOPRIL 2.5 MG PO TABS: 2.5000 mg | ORAL_TABLET | Freq: Every day | ORAL | 1 refills | Status: DC

## 2018-11-13 MED ORDER — FENOFIBRATE 145 MG PO TABS: 145.0000 mg | ORAL_TABLET | Freq: Every day | ORAL | 3 refills | Status: DC

## 2018-11-13 MED ORDER — CYCLOBENZAPRINE HCL 10 MG PO TABS: 10.0000 mg | ORAL_TABLET | Freq: Three times a day (TID) | ORAL | 1 refills | Status: AC | PRN

## 2018-11-13 MED ORDER — SITAGLIPTIN PHOS-METFORMIN HCL 50-1000 MG PO TABS: 1.0000 | ORAL_TABLET | Freq: Two times a day (BID) | ORAL | 3 refills | Status: DC

## 2018-11-13 MED ORDER — INSULIN LISPRO (1 UNIT DIAL) 100 UNIT/ML (KWIKPEN): 20.0000 [IU] | PEN_INJECTOR | Freq: Three times a day (TID) | SUBCUTANEOUS | 3 refills | Status: DC

## 2018-11-13 MED ORDER — INSULIN GLARGINE 100 UNIT/ML SOLOSTAR PEN: 40.0000 [IU] | PEN_INJECTOR | Freq: Every day | SUBCUTANEOUS | 11 refills | Status: DC

## 2018-11-13 MED ORDER — IBUPROFEN 800 MG PO TABS: 800.0000 mg | ORAL_TABLET | Freq: Three times a day (TID) | ORAL | 1 refills | Status: DC | PRN

## 2018-11-13 MED ORDER — GLUCOSE BLOOD VI STRP: ORAL_STRIP | 12 refills | Status: DC

## 2018-11-13 MED ORDER — ATORVASTATIN CALCIUM 20 MG PO TABS: 20.0000 mg | ORAL_TABLET | Freq: Every day | ORAL | 3 refills | Status: DC

## 2018-11-13 MED FILL — $LANTUS SOLOSTAR 100 UNITS/: 100 | 29 days supply | Qty: 12 | Fill #0

## 2018-11-13 MED FILL — CYCLOBENZAPRINE 10 MG TAB: 10 | 10 days supply | Qty: 30 | Fill #0

## 2018-11-13 MED FILL — ?ATORVASTATIN 20 MG TABLET: 20 | 30 days supply | Qty: 30 | Fill #0

## 2018-11-13 MED FILL — IBUPROFEN 800 MG TABLET: 800 | 10 days supply | Qty: 30 | Fill #0

## 2018-11-13 MED FILL — FENOFIBRATE 145 MG TABLET: 145 | 30 days supply | Qty: 30 | Fill #0

## 2018-11-13 MED FILL — ?HUMALOG 100 UNITS/ML KWIKP: 100 | 25 days supply | Qty: 15 | Fill #0

## 2018-11-13 MED FILL — TRUE METRIX TEST STRIP: 30 days supply | Qty: 100 | Fill #0
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
## 2018-11-14 LAB — LIPID PANEL
Chol/HDL Ratio: 5 ratio (ref 0.0–5.0)
Cholesterol, Total: 174 mg/dL (ref 100–199)
HDL: 35 mg/dL — ABNORMAL LOW (ref >39)
LDL Calculated: 105 mg/dL — ABNORMAL HIGH (ref 0–99)
Triglycerides: 169 mg/dL — ABNORMAL HIGH (ref 0–149)
VLDL Cholesterol Cal: 34 mg/dL (ref 5–40)

## 2018-11-14 LAB — CMP14+EGFR
ALT: 17 IU/L (ref 0–44)
AST: 17 IU/L (ref 0–40)
Albumin/Globulin Ratio: 1.7 (ref 1.2–2.2)
Albumin: 4.3 g/dL (ref 3.8–4.9)
Alkaline Phosphatase: 63 IU/L (ref 39–117)
BUN/Creatinine Ratio: 16 (ref 9–20)
BUN: 20 mg/dL (ref 6–24)
Bilirubin Total: 0.2 mg/dL (ref 0.0–1.2)
CO2: 21 mmol/L (ref 20–29)
Calcium: 9.4 mg/dL (ref 8.7–10.2)
Chloride: 100 mmol/L (ref 96–106)
Creatinine, Ser: 1.28 mg/dL — ABNORMAL HIGH (ref 0.76–1.27)
GFR calc Af Amer: 72 mL/min/{1.73_m2} (ref >59)
GFR calc non Af Amer: 62 mL/min/{1.73_m2} (ref >59)
Globulin, Total: 2.5 g/dL (ref 1.5–4.5)
Glucose: 148 mg/dL — ABNORMAL HIGH (ref 65–99)
Potassium: 4.4 mmol/L (ref 3.5–5.2)
Sodium: 136 mmol/L (ref 134–144)
Total Protein: 6.8 g/dL (ref 6.0–8.5)

## 2018-11-14 LAB — MICROALBUMIN / CREATININE URINE RATIO
Creatinine, Urine: 135.3 mg/dL
Microalb/Creat Ratio: 5 mg/g creat (ref 0–29)
Microalbumin, Urine: 7.4 ug/mL

## 2018-11-17 NOTE — Progress Notes (Signed)
CMA spoke to patient to inform on lab results.  Pt. Verified DOB Pt. Understood.  

## 2018-11-22 ENCOUNTER — Ambulatory Visit: Payer: No Typology Code available for payment source

## 2018-12-14 ENCOUNTER — Other Ambulatory Visit: Payer: Self-pay

## 2018-12-14 DIAGNOSIS — IMO0001 Reserved for inherently not codable concepts without codable children: Secondary | ICD-10-CM

## 2018-12-14 NOTE — Telephone Encounter (Signed)
This is a PASS refill request.This patient was last seen 11/13/18,has appt scheduled for 02/13/19,please authorize refill if appropriate.This script will go to [facility]

## 2018-12-16 MED ORDER — INSULIN LISPRO (1 UNIT DIAL) 100 UNIT/ML (KWIKPEN): 20.0000 [IU] | PEN_INJECTOR | Freq: Three times a day (TID) | SUBCUTANEOUS | 0 refills | Status: DC

## 2018-12-19 MED FILL — FENOFIBRATE 145 MG TABS: 145 | 30 days supply | Qty: 30 | Fill #1

## 2018-12-19 MED FILL — ?ATORVASTATIN 20 MG TABLET: 20 | 30 days supply | Qty: 30 | Fill #1

## 2019-01-02 MED FILL — $LANTUS SOLOSTAR 100 UNITS/: 100 | 29 days supply | Qty: 12 | Fill #1

## 2019-01-02 MED FILL — ?HUMALOG 100 UNITS/ML KWIKP: 100 | 25 days supply | Qty: 15 | Fill #1

## 2019-01-03 ENCOUNTER — Other Ambulatory Visit: Payer: Self-pay | Admitting: Pharmacist

## 2019-01-03 MED ORDER — TRUEPLUS 5-BEVEL PEN NEEDLES 31G X 5 MM MISC: 11 refills | Status: DC

## 2019-01-04 MED FILL — TRUEPLUS PEN NDL 31G X 1/4: 31G X 6 MM | 30 days supply | Qty: 100 | Fill #0

## 2019-01-26 MED FILL — ?ATORVASTATIN 20 MG TABLET: 20 | 30 days supply | Qty: 30 | Fill #2

## 2019-01-26 MED FILL — FENOFIBRATE 145 MG TABS: 145 | 30 days supply | Qty: 30 | Fill #2

## 2019-02-08 MED FILL — FENOFIBRATE 145 MG TAB: 145 | 30 days supply | Qty: 30 | Fill #2

## 2019-02-08 MED FILL — $LANTUS SOLOSTAR 100 UNITS/: 100 | 29 days supply | Qty: 12 | Fill #2

## 2019-02-08 MED FILL — JANUMET 50-1,000 MG TABLET: 50-1000 | 30 days supply | Qty: 60 | Fill #0

## 2019-02-08 MED FILL — ?HUMALOG 100 UNITS/ML KWIKP: 100 | 25 days supply | Qty: 15 | Fill #2

## 2019-02-13 ENCOUNTER — Ambulatory Visit: Payer: No Typology Code available for payment source | Admitting: Nurse Practitioner

## 2019-03-15 MED FILL — ?HUMALOG 100 UNITS/ML KWIKP: 100 | 25 days supply | Qty: 15 | Fill #3

## 2019-03-15 MED FILL — ?ATORVASTATIN 20 MG TABLET: 20 | 30 days supply | Qty: 30 | Fill #3

## 2019-03-15 MED FILL — ?FENOFIBRATE 145MG TABLET: 145 | 30 days supply | Qty: 30 | Fill #3

## 2019-03-16 MED FILL — TRUEPLUS PEN NDL 31G X 1/4: 31G X 6 MM | 30 days supply | Qty: 100 | Fill #0

## 2019-04-11 MED FILL — TRUEPLUS PEN NDL 31G X 1/4: 31G X 6 MM | 30 days supply | Qty: 100 | Fill #0

## 2019-04-11 MED FILL — TRUEPLUS PEN NDL 31G X 1/4": 31G X 6 MM | 30 days supply | Qty: 100 | Fill #0

## 2019-04-11 MED FILL — ?HUMALOG 100 UNITS/ML KWIKP: 100 | 25 days supply | Qty: 15 | Fill #0

## 2019-04-11 MED FILL — $LANTUS SOLOSTAR 100 UNITS/: 100 | 29 days supply | Qty: 12 | Fill #3

## 2019-04-24 MED FILL — ?ATORVASTATIN 20 MG TABLET: 20 | 30 days supply | Qty: 30 | Fill #4

## 2019-04-24 MED FILL — ?FENOFIBRATE 145MG TABLET: 145 | 30 days supply | Qty: 30 | Fill #4

## 2019-05-17 ENCOUNTER — Other Ambulatory Visit: Payer: Self-pay | Admitting: Family Medicine

## 2019-05-17 MED FILL — JANUMET 50-1,000 MG TABLET: 50-1000 | 30 days supply | Qty: 60 | Fill #1

## 2019-05-18 ENCOUNTER — Ambulatory Visit: Payer: Self-pay | Attending: Nurse Practitioner | Admitting: Nurse Practitioner

## 2019-05-18 ENCOUNTER — Other Ambulatory Visit: Payer: Self-pay

## 2019-05-18 ENCOUNTER — Encounter: Payer: Self-pay | Admitting: Nurse Practitioner

## 2019-05-18 VITALS — BP 126/81 | HR 97 | Temp 99.1°F | Ht 66.0 in | Wt 189.8 lb

## 2019-05-18 DIAGNOSIS — M542 Cervicalgia: Secondary | ICD-10-CM

## 2019-05-18 DIAGNOSIS — Z720 Tobacco use: Secondary | ICD-10-CM

## 2019-05-18 DIAGNOSIS — Z1211 Encounter for screening for malignant neoplasm of colon: Secondary | ICD-10-CM

## 2019-05-18 DIAGNOSIS — M549 Dorsalgia, unspecified: Secondary | ICD-10-CM

## 2019-05-18 DIAGNOSIS — E782 Mixed hyperlipidemia: Secondary | ICD-10-CM

## 2019-05-18 DIAGNOSIS — E11319 Type 2 diabetes mellitus with unspecified diabetic retinopathy without macular edema: Secondary | ICD-10-CM

## 2019-05-18 DIAGNOSIS — Z794 Long term (current) use of insulin: Secondary | ICD-10-CM

## 2019-05-18 DIAGNOSIS — Z130 Encounter for screening for diseases of the blood and blood-forming organs and certain disorders involving the immune mechanism: Secondary | ICD-10-CM

## 2019-05-18 DIAGNOSIS — E1165 Type 2 diabetes mellitus with hyperglycemia: Secondary | ICD-10-CM

## 2019-05-18 DIAGNOSIS — N529 Male erectile dysfunction, unspecified: Secondary | ICD-10-CM

## 2019-05-18 DIAGNOSIS — Z125 Encounter for screening for malignant neoplasm of prostate: Secondary | ICD-10-CM

## 2019-05-18 DIAGNOSIS — G8929 Other chronic pain: Secondary | ICD-10-CM

## 2019-05-18 LAB — POCT GLYCOSYLATED HEMOGLOBIN (HGB A1C): Hemoglobin A1C: 10.1 % — AB (ref 4.0–5.6)

## 2019-05-18 LAB — GLUCOSE, POCT (MANUAL RESULT ENTRY): POC Glucose: 196 mg/dl — AB (ref 70–99)

## 2019-05-18 MED ORDER — SILDENAFIL CITRATE 100 MG PO TABS: 50.0000 mg | ORAL_TABLET | Freq: Every day | ORAL | 11 refills | Status: DC | PRN

## 2019-05-18 MED ORDER — INSULIN LISPRO (1 UNIT DIAL) 100 UNIT/ML (KWIKPEN): 20.0000 [IU] | PEN_INJECTOR | Freq: Three times a day (TID) | SUBCUTANEOUS | 3 refills | Status: DC

## 2019-05-18 MED ORDER — CYCLOBENZAPRINE HCL 10 MG PO TABS: 10.0000 mg | ORAL_TABLET | Freq: Three times a day (TID) | ORAL | 2 refills | Status: DC | PRN

## 2019-05-18 MED ORDER — IBUPROFEN 800 MG PO TABS: 800.0000 mg | ORAL_TABLET | Freq: Three times a day (TID) | ORAL | 2 refills | Status: DC | PRN

## 2019-05-18 MED ORDER — DICLOFENAC SODIUM 1 % EX GEL: 2.0000 g | Freq: Four times a day (QID) | CUTANEOUS | 1 refills | Status: AC

## 2019-05-18 MED ORDER — TRUE METRIX BLOOD GLUCOSE TEST VI STRP: ORAL_STRIP | 12 refills | Status: DC

## 2019-05-18 MED ORDER — JANUMET 50-1000 MG PO TABS: 1.0000 | ORAL_TABLET | Freq: Two times a day (BID) | ORAL | 3 refills | Status: DC

## 2019-05-18 MED ORDER — TRUEPLUS 5-BEVEL PEN NEEDLES 31G X 5 MM MISC: 11 refills | Status: DC

## 2019-05-18 MED ORDER — LISINOPRIL 2.5 MG PO TABS: 2.5000 mg | ORAL_TABLET | Freq: Every day | ORAL | 1 refills | Status: DC

## 2019-05-18 MED FILL — LISINOPRIL 2.5 MG TABLET: 2.5 | 30 days supply | Qty: 30 | Fill #0

## 2019-05-18 MED FILL — ?HUMALOG 100 UNITS/ML KWIKP: 100 | 30 days supply | Qty: 18 | Fill #0

## 2019-05-18 MED FILL — SILDENAFIL CITRATE 100 MG T: 100 | 30 days supply | Qty: 10 | Fill #0

## 2019-05-18 MED FILL — CYCLOBENZAPRINE 10 MG TAB: 10 | 20 days supply | Qty: 60 | Fill #0

## 2019-05-18 MED FILL — ?IBUPROFEN 800 MG TABS: AMNEAL | 20 days supply | Qty: 60 | Fill #0

## 2019-05-18 NOTE — Progress Notes (Signed)
Assessment & Plan:  Kristofor was seen today for medication refill.  Diagnoses and all orders for this visit:  Poorly controlled type 2 diabetes mellitus (HCC) -     Glucose (CBG) -     HgB A1c -     CMP14+EGFR -     sitaGLIPtin-metformin (JANUMET) 50-1000 MG tablet; Take 1 tablet by mouth 2 (two) times daily with a meal. -     lisinopril (ZESTRIL) 2.5 MG tablet; Take 1 tablet (2.5 mg total) by mouth daily. -     Insulin Pen Needle (TRUEPLUS 5-BEVEL PEN NEEDLES) 31G X 5 MM MISC; Use as directed to administer insulin -     insulin lispro (HUMALOG KWIKPEN) 100 UNIT/ML KwikPen; Inject 0.2 mLs (20 Units total) into the skin 3 (three) times daily. -     glucose blood (TRUE METRIX BLOOD GLUCOSE TEST) test strip; Use as instructed Continue blood sugar control as discussed in office today, low carbohydrate diet, and regular physical exercise as tolerated, 150 minutes per week (30 min each day, 5 days per week, or 50 min 3 days per week). Keep blood sugar logs with fasting goal of 90-130 mg/dl, post prandial (after you eat) less than 180.  For Hypoglycemia: BS <60 and Hyperglycemia BS >400; contact the clinic ASAP. Annual eye exams and foot exams are recommended.  Prostate cancer screening -     PSA  Mixed hyperlipidemia -     Lipid Panel  Tobacco abuse disorder Evaristo was counseled on the dangers of tobacco use, and was advised to quit. Reviewed strategies to maximize success, including removing cigarettes and smoking materials from environment, stress management and support of family/friends as well as pharmacological alternatives including: Wellbutrin, Chantix, Nicotine patch, Nicotine gum or lozenges. Smoking cessation support: smoking cessation hotline: 1-800-QUIT-NOW.  Smoking cessation classes are also available through [facility]. Call [phone] or visit our website at [email or web address]   A total of 3 minutes was spent on counseling for smoking cessation  and Ladislaus is not ready to quit.    Screening for deficiency anemia -     CBC  Colon cancer screening -     Fecal occult blood, imunochemical([facility])  Chronic neck and back pain -     cyclobenzaprine (FLEXERIL) 10 MG tablet; Take 1 tablet (10 mg total) by mouth 3 (three) times daily as needed for muscle spasms. -     diclofenac Sodium (VOLTAREN) 1 % GEL; Apply 2 g topically 4 (four) times daily. -     ibuprofen (ADVIL) 800 MG tablet; Take 1 tablet (800 mg total) by mouth every 8 (eight) hours as needed. Work on losing weight to help reduce joint pain. May alternate with heat and ice application for pain relief. May also alternate with acetaminophen  as prescribed pain relief. Other alternatives include massage, acupuncture and water aerobics.  You must stay active and avoid a sedentary lifestyle.   Erectile dysfunction, unspecified erectile dysfunction type -     sildenafil (VIAGRA) 100 MG tablet; Take 0.5-1 tablets (50-100 mg total) by mouth daily as needed for erectile dysfunction. Diabetes needs to be better controlled ENCOURAGED TO STOP SMOKING  Patient has been counseled on age-appropriate routine health concerns for screening and prevention. These are reviewed and up-to-date. Referrals have been placed accordingly. Immunizations are up-to-date or declined.    Subjective:   Chief Complaint  Patient presents with  . Medication Refill    Pt. is here for medication refill for diabetes.  HPI ODAS OZER 58 y.o. male presents to office today for follow up.  has a past medical history of Chronic back pain, Chronic lower back pain, Chronic neck pain, DDD (degenerative disc disease), lumbar, Diabetes mellitus, Genital herpes, and Hyperlipidemia.    Blood pressure is well controlled.  He takes a renal dose of lisinopril 2.5 mg daily. Denies chest pain, shortness of breath, palpitations, lightheadedness, dizziness, headaches or BLE edema.  BP Readings from Last 3  Encounters:  05/18/19 126/81  11/13/18 110/66  05/15/18 121/79    Hyperlipidemia Patient presents for follow up to hyperlipidemia.  He is medication compliant taking atorvastatin 20 mg daily. He is not consistently diet compliant and denies statin intolerance including myalgias.  Lab Results  Component Value Date   CHOL 174 11/13/2018   Lab Results  Component Value Date   HDL 35 (L) 11/13/2018   Lab Results  Component Value Date   LDLCALC 105 (H) 11/13/2018   Lab Results  Component Value Date   TRIG 169 (H) 11/13/2018   Lab Results  Component Value Date   CHOLHDL 5.0 11/13/2018      Diabetes Mellitus Type II Current symptoms/problems include hyperglycemia and have been stable.  Current diabetic medications include:  Eye exam current (within one year): no. Patient has been advised to apply for financial assistance and schedule to see our financial counselor.  Weight trend: stable Janumet 50--1000 mg twice daily , Lantus 40 units daily and Humalog 20 units 3 times daily Prior visit with dietician: no Current monitoring regimen: home blood tests - Infrequently Home blood sugar records: Postprandial 200s Any episodes of hypoglycemia? no Is He on ACE inhibitor or angiotensin II receptor blocker?  Yes , renal dose Lab Results  Component Value Date   HGBA1C 10.1 (A) 05/18/2019   HGBA1C 10.3 (A) 11/13/2018   HGBA1C 11.3 (A) 03/30/2018     Erectile Dysfunction: Patient complains of erectile dysfunction.  Onset of dysfunction was several months ago and was gradual in onset.  Patient states the nature of difficulty is both attaining and maintaining erection. Full erections occur spontaneously. Partial erections occur with intercourse. Libido is not affected. Risk factors for ED include diabetes mellitus. Patient denies history of cranial, spinal, or pelvic trauma.    Review of Systems  Constitutional: Negative for fever, malaise/fatigue and weight loss.  HENT: Negative.   Negative for nosebleeds.   Eyes: Negative.  Negative for blurred vision, double vision and photophobia.  Respiratory: Negative.  Negative for cough and shortness of breath.   Cardiovascular: Negative.  Negative for chest pain, palpitations and leg swelling.  Gastrointestinal: Negative.  Negative for heartburn, nausea and vomiting.  Genitourinary: Negative for dysuria, flank pain, frequency, hematuria and urgency.       Erectile dysfunction  Musculoskeletal: Positive for back pain, myalgias and neck pain.       Chronic and controlled with muscle relaxant and NSAID  Neurological: Negative.  Negative for dizziness, focal weakness, seizures and headaches.  Psychiatric/Behavioral: Negative.  Negative for suicidal ideas.    Past Medical History:  Diagnosis Date  . Chronic back pain   . Chronic lower back pain   . Chronic neck pain   . DDD (degenerative disc disease), lumbar   . Diabetes mellitus   . Genital herpes   . Hyperlipidemia     Past Surgical History:  Procedure Laterality Date  . SHOULDER SURGERY      History reviewed. No pertinent family history.  Social History Reviewed with  no changes to be made today.   Outpatient Medications Prior to Visit  Medication Sig Dispense Refill  . aspirin EC 81 MG tablet Take 1 tablet (81 mg total) by mouth daily. 120 tablet 2  . atorvastatin (LIPITOR) 20 MG tablet Take 1 tablet (20 mg total) by mouth daily. 90 tablet 3  . Blood Glucose Monitoring Suppl (TRUE METRIX METER) w/Device KIT 1 each by Does not apply route 3 (three) times daily. 1 kit 0  . fenofibrate (TRICOR) 145 MG tablet Take 1 tablet (145 mg total) by mouth daily. 90 tablet 3  . tolnaftate (TINACTIN) 1 % powder Apply 1 application topically 2 (two) times daily. Apply to bilateral toes, keep dry. 45 g 2  . TRUEPLUS LANCETS 28G MISC 1 each by Does not apply route 3 (three) times daily. 100 each 12  . Vitamin D, Ergocalciferol, (DRISDOL) 50000 units CAPS capsule Take 1 capsule  (50,000 Units total) by mouth every 7 (seven) days. 12 capsule 0  . diclofenac sodium (VOLTAREN) 1 % GEL Apply 2 g topically 4 (four) times daily. 3 Tube 5  . glucose blood (TRUE METRIX BLOOD GLUCOSE TEST) test strip Use as instructed 100 each 12  . ibuprofen (ADVIL) 800 MG tablet Take 1 tablet (800 mg total) by mouth every 8 (eight) hours as needed. 30 tablet 1  . Insulin Pen Needle (TRUEPLUS 5-BEVEL PEN NEEDLES) 31G X 5 MM MISC Use as directed to administer insulin 100 each 11  . Insulin Glargine (LANTUS) 100 UNIT/ML Solostar Pen Inject 40 Units into the skin daily at 10 pm for 30 days. 15 mL 11  . insulin lispro (HUMALOG KWIKPEN) 100 UNIT/ML KwikPen Inject 0.2 mLs (20 Units total) into the skin 3 (three) times daily. 75 mL 0  . lisinopril (ZESTRIL) 2.5 MG tablet Take 1 tablet (2.5 mg total) by mouth daily. 90 tablet 1  . sitaGLIPtin-metformin (JANUMET) 50-1000 MG tablet Take 1 tablet by mouth 2 (two) times daily with a meal for 30 days. 60 tablet 3   No facility-administered medications prior to visit.     No Known Allergies     Objective:    BP 126/81 (BP Location: Left Arm, Patient Position: Sitting, Cuff Size: Normal)   Pulse 97   Temp 99.1 F (37.3 C) (Oral)   Ht 5' 6"  (1.676 m)   Wt 189 lb 12.8 oz (86.1 kg)   SpO2 97%   BMI 30.63 kg/m  Wt Readings from Last 3 Encounters:  05/18/19 189 lb 12.8 oz (86.1 kg)  11/13/18 184 lb (83.5 kg)  05/15/18 188 lb (85.3 kg)    Physical Exam Vitals signs and nursing note reviewed.  Constitutional:      Appearance: He is well-developed.  HENT:     Head: Normocephalic and atraumatic.  Neck:     Musculoskeletal: Normal range of motion.  Cardiovascular:     Rate and Rhythm: Normal rate and regular rhythm.     Heart sounds: Normal heart sounds. No murmur. No friction rub. No gallop.   Pulmonary:     Effort: Pulmonary effort is normal. No tachypnea or respiratory distress.     Breath sounds: Normal breath sounds. No decreased breath  sounds, wheezing, rhonchi or rales.  Chest:     Chest wall: No tenderness.  Abdominal:     General: Bowel sounds are normal.     Palpations: Abdomen is soft.  Musculoskeletal: Normal range of motion.  Skin:    General: Skin is warm and dry.  Neurological:     Mental Status: He is alert and oriented to person, place, and time.     Coordination: Coordination normal.  Psychiatric:        Behavior: Behavior normal. Behavior is cooperative.        Thought Content: Thought content normal.        Judgment: Judgment normal.          Patient has been counseled extensively about nutrition and exercise as well as the importance of adherence with medications and regular follow-up. The patient was given clear instructions to go to ER or return to medical center if symptoms don't improve, worsen or new problems develop. The patient verbalized understanding.   Follow-up: Return in about 4 weeks (around 06/15/2019) for see LUKE for METER check in 4 weeks; see me in 3 months.   Gildardo Pounds, FNP-BC [facility] and [place], [place]   05/18/2019, 12:07 PM

## 2019-05-19 LAB — CMP14+EGFR
ALT: 17 IU/L (ref 0–44)
AST: 18 IU/L (ref 0–40)
Albumin/Globulin Ratio: 1.5 (ref 1.2–2.2)
Albumin: 4.2 g/dL (ref 3.8–4.9)
Alkaline Phosphatase: 74 IU/L (ref 39–117)
BUN/Creatinine Ratio: 16 (ref 9–20)
BUN: 20 mg/dL (ref 6–24)
Bilirubin Total: 0.2 mg/dL (ref 0.0–1.2)
CO2: 22 mmol/L (ref 20–29)
Calcium: 9.2 mg/dL (ref 8.7–10.2)
Chloride: 103 mmol/L (ref 96–106)
Creatinine, Ser: 1.29 mg/dL — ABNORMAL HIGH (ref 0.76–1.27)
GFR calc Af Amer: 71 mL/min/{1.73_m2} (ref >59)
GFR calc non Af Amer: 61 mL/min/{1.73_m2} (ref >59)
Globulin, Total: 2.8 g/dL (ref 1.5–4.5)
Glucose: 195 mg/dL — ABNORMAL HIGH (ref 65–99)
Potassium: 4.6 mmol/L (ref 3.5–5.2)
Sodium: 139 mmol/L (ref 134–144)
Total Protein: 7 g/dL (ref 6.0–8.5)

## 2019-05-19 LAB — LIPID PANEL
Chol/HDL Ratio: 5.2 ratio — ABNORMAL HIGH (ref 0.0–5.0)
Cholesterol, Total: 172 mg/dL (ref 100–199)
HDL: 33 mg/dL — ABNORMAL LOW (ref >39)
LDL Chol Calc (NIH): 111 mg/dL — ABNORMAL HIGH (ref 0–99)
Triglycerides: 154 mg/dL — ABNORMAL HIGH (ref 0–149)
VLDL Cholesterol Cal: 28 mg/dL (ref 5–40)

## 2019-05-19 LAB — CBC
Hematocrit: 43.7 % (ref 37.5–51.0)
Hemoglobin: 14.3 g/dL (ref 13.0–17.7)
MCH: 28.4 pg (ref 26.6–33.0)
MCHC: 32.7 g/dL (ref 31.5–35.7)
MCV: 87 fL (ref 79–97)
Platelets: 241 10*3/uL (ref 150–450)
RBC: 5.03 x10E6/uL (ref 4.14–5.80)
RDW: 13.3 % (ref 11.6–15.4)
WBC: 7.2 10*3/uL (ref 3.4–10.8)

## 2019-05-19 LAB — PSA: Prostate Specific Ag, Serum: 2.8 ng/mL (ref 0.0–4.0)

## 2019-05-26 ENCOUNTER — Other Ambulatory Visit: Payer: Self-pay | Admitting: Nurse Practitioner

## 2019-05-26 DIAGNOSIS — E782 Mixed hyperlipidemia: Secondary | ICD-10-CM

## 2019-05-26 MED ORDER — ATORVASTATIN CALCIUM 40 MG PO TABS: 40.0000 mg | ORAL_TABLET | Freq: Every day | ORAL | 2 refills | Status: DC

## 2019-05-28 MED FILL — ?ATORVASTATIN 40MG TABLET: 40 | 30 days supply | Qty: 30 | Fill #0

## 2019-06-18 ENCOUNTER — Ambulatory Visit: Payer: No Typology Code available for payment source | Admitting: Pharmacist

## 2019-06-22 ENCOUNTER — Ambulatory Visit: Payer: No Typology Code available for payment source | Admitting: Pharmacist

## 2019-06-25 ENCOUNTER — Encounter: Payer: Self-pay | Admitting: Pharmacist

## 2019-06-25 ENCOUNTER — Other Ambulatory Visit: Payer: Self-pay

## 2019-06-25 ENCOUNTER — Ambulatory Visit: Payer: Self-pay | Attending: Nurse Practitioner | Admitting: Pharmacist

## 2019-06-25 DIAGNOSIS — E1165 Type 2 diabetes mellitus with hyperglycemia: Secondary | ICD-10-CM

## 2019-06-25 DIAGNOSIS — E118 Type 2 diabetes mellitus with unspecified complications: Secondary | ICD-10-CM

## 2019-06-25 DIAGNOSIS — Z794 Long term (current) use of insulin: Secondary | ICD-10-CM

## 2019-06-25 DIAGNOSIS — IMO0002 Reserved for concepts with insufficient information to code with codable children: Secondary | ICD-10-CM

## 2019-06-25 MED ORDER — INSULIN GLARGINE 100 UNIT/ML SOLOSTAR PEN: 50.0000 [IU] | PEN_INJECTOR | Freq: Every day | SUBCUTANEOUS | 11 refills | Status: DC

## 2019-06-25 MED FILL — ?BASAGLAR 100 UNITS/ML KWPE: 100 | 29 days supply | Qty: 12 | Fill #4

## 2019-06-25 NOTE — Patient Instructions (Signed)
Thank you for coming to see me today. Please do the following:  1. Increase Lantus to 50 units daily.  2. Take Janumet twice daily.  3. Try to take Humalog 15 minutes before each meal.   4. Continue checking blood sugars at home.  5. Continue making the lifestyle changes we've discussed together during our visit. Diet and exercise play a significant role in improving your blood sugars.  6. Follow-up with    Hypoglycemia or low blood sugar:   Low blood sugar can happen quickly and may become an emergency if not treated right away.   While this shouldn't happen often, it can be brought upon if you skip a meal or do not eat enough. Also, if your insulin or other diabetes medications are dosed too high, this can cause your blood sugar to go to low.   Warning signs of low blood sugar include: 1. Feeling shaky or dizzy 2. Feeling weak or tired  3. Excessive hunger 4. Feeling anxious or upset  5. Sweating even when you aren't exercising  What to do if I experience low blood sugar? 1. Check your blood sugar with your meter. If lower than 70, proceed to step 2.  2. Treat with 3-4 glucose tablets or 3 packets of regular sugar. If these aren't around, you can try hard candy. Yet another option would be to drink 4 ounces of fruit juice or 6 ounces of REGULAR soda.  3. Re-check your sugar in 15 minutes. If it is still below 70, do what you did in step 2 again. If has come back up, go ahead and eat a snack or small meal at this time.

## 2019-06-25 NOTE — Progress Notes (Signed)
    S:     No chief complaint on file.  Patient arrives in good spirits.  Presents for diabetes evaluation, education, and management Patient was referred and last seen by Primary Care Provider on 05/18/19.     Family/Social History:  - FHx: no pertinent positives - Tobacco: current every day smoker - Alcohol: denies   Insurance coverage/medication affordability: self pay  Patient reports adherence with medications.  Current diabetes medications include: Janumet 50-1000 mg BID, Lantus 40 units daily, Humalog 20 units TID Current hypertension medications include: lisinopril 2.5 mg daily Current hyperlipidemia medications include: atorvastatin 40 mg daily, fenofibrate 145 mg daily   Patient denies hypoglycemic events.  Patient reported dietary habits:  - Not diet compliant - Admits to eating cakes, drinking fruit juices, and sometimes eating candy - Admits to eating potatoes, french fries, and bread  Patient-reported exercise habits:  - Does not exercise regularly    Patient denies nocturia (nighttime urination).  Patient reports neuropathy (nerve pain). Patient reports visual changes. Patient reports self foot exams.     O:  Physical Exam   ROS   Lab Results  Component Value Date   HGBA1C 10.1 (A) 05/18/2019   There were no vitals filed for this visit.  Lipid Panel     Component Value Date/Time   CHOL 172 05/18/2019 1140   TRIG 154 (H) 05/18/2019 1140   HDL 33 (L) 05/18/2019 1140   CHOLHDL 5.2 (H) 05/18/2019 1140   CHOLHDL 6.5 (H) 04/05/2016 1056   VLDL 64 (H) 04/05/2016 1056   LDLCALC 111 (H) 05/18/2019 1140   LDLDIRECT 94.7 05/25/2013 1612   Home CBGs: wide range 280 - 350s  Clinical Atherosclerotic Cardiovascular Disease (ASCVD): No  The 10-year ASCVD risk score Denman George DC Jr., et al., 2013) is: 24.4%   Values used to calculate the score:     Age: 59 years     Sex: Male     Is Non-Hispanic African American: No     Diabetic: Yes     Tobacco smoker:  Yes     Systolic Blood Pressure: 126 mmHg     Is BP treated: No     HDL Cholesterol: 33 mg/dL     Total Cholesterol: 172 mg/dL    A/P: Diabetes longstanding currently uncontrolled. Patient is able to verbalize appropriate hypoglycemia management plan. Patient is adherent with medication. Control is suboptimal due to dietary indiscretion and physical inactivity.   Pt educated to inject Humalog 15 minutes before meals. Additionally, I recommend increasing basal insulin to 50 units daily. Other medications continued. Unfortunately, pt will continue to have poor glycemic control despite insulin titration if he doesn't make the necessary changes in lifestyle.   -Increased dose of Lantus to 50 units daily.  -Continued other medications.  -Extensively discussed pathophysiology of diabetes, recommended lifestyle interventions, dietary effects on blood sugar control -Counseled on s/sx of and management of hypoglycemia -Next A1C anticipated 08/2019.   Written patient instructions provided. Total time in face to face counseling 30 minutes.   Follow up Pharmacist Clinic Visit 07/24/2019.  Butch Penny, PharmD, CPP Clinical Pharmacist [facility] [phone]

## 2019-07-13 MED FILL — ?HUMALOG 100 UNITS/ML KWIKP: 100 | 30 days supply | Qty: 18 | Fill #1

## 2019-07-24 ENCOUNTER — Ambulatory Visit: Payer: No Typology Code available for payment source | Admitting: Pharmacist

## 2019-07-26 ENCOUNTER — Other Ambulatory Visit: Payer: Self-pay

## 2019-07-26 ENCOUNTER — Ambulatory Visit: Payer: Self-pay | Attending: Nurse Practitioner | Admitting: Pharmacist

## 2019-07-26 DIAGNOSIS — E118 Type 2 diabetes mellitus with unspecified complications: Secondary | ICD-10-CM

## 2019-07-26 DIAGNOSIS — E1165 Type 2 diabetes mellitus with hyperglycemia: Secondary | ICD-10-CM

## 2019-07-26 DIAGNOSIS — IMO0002 Reserved for concepts with insufficient information to code with codable children: Secondary | ICD-10-CM

## 2019-07-26 DIAGNOSIS — Z794 Long term (current) use of insulin: Secondary | ICD-10-CM

## 2019-07-26 MED FILL — ?BASAGLAR 100 UNITS/ML KWPE: 100 | 30 days supply | Qty: 15 | Fill #0

## 2019-07-26 MED FILL — ?FENOFIBRATE 145 MG TABLET: 145 | 30 days supply | Qty: 30 | Fill #5

## 2019-07-26 MED FILL — ?ATORVASTATIN 40MG TABL: 40 | 30 days supply | Qty: 30 | Fill #1

## 2019-07-26 MED FILL — CYCLOBENZAPRINE 10 MG TAB: 10 | 20 days supply | Qty: 60 | Fill #1

## 2019-07-26 MED FILL — JANUMET 50-1,000 MG TABLET: 50-1000 | 30 days supply | Qty: 60 | Fill #2

## 2019-07-26 NOTE — Progress Notes (Signed)
    S:     No chief complaint on file.  Patient arrives in good spirits.  Presents for diabetes evaluation, education, and management Patient was referred and last seen by Primary Care Provider on 05/18/19.     Family/Social History:  - FHx: no pertinent positives - Tobacco: current every day smoker - Alcohol: denies   Insurance coverage/medication affordability: self pay  Patient reports adherence with medications.  Current diabetes medications include: Janumet 50-1000 mg BID, Lantus 50 units daily, Humalog 20 units TID Current hypertension medications include: lisinopril 2.5 mg daily Current hyperlipidemia medications include: atorvastatin 40 mg daily, fenofibrate 145 mg daily   Patient denies hypoglycemic events.  Patient reported dietary habits:  - Not diet compliant - Admits to eating cakes, drinking fruit juices, and sometimes eating candy - Admits to eating potatoes, french fries, and bread  Patient-reported exercise habits:  - Does not exercise regularly    Patient denies nocturia (nighttime urination).  Patient reports neuropathy (nerve pain). Patient reports visual changes. Patient reports self foot exams.     O:  187  Lab Results  Component Value Date   HGBA1C 10.1 (A) 05/18/2019   There were no vitals filed for this visit.  Lipid Panel     Component Value Date/Time   CHOL 172 05/18/2019 1140   TRIG 154 (H) 05/18/2019 1140   HDL 33 (L) 05/18/2019 1140   CHOLHDL 5.2 (H) 05/18/2019 1140   CHOLHDL 6.5 (H) 04/05/2016 1056   VLDL 64 (H) 04/05/2016 1056   LDLCALC 111 (H) 05/18/2019 1140   LDLDIRECT 94.7 05/25/2013 1612   Home CBGs: wide range 89-120s in the morning since meeting with me. Does have occasional outliers in the 300s when he smokes/eats too much.   Clinical Atherosclerotic Cardiovascular Disease (ASCVD): No  The 10-year ASCVD risk score Denman George DC Jr., et al., 2013) is: 24.4%   Values used to calculate the score:     Age: 59 years     Sex:  Male     Is Non-Hispanic African American: No     Diabetic: Yes     Tobacco smoker: Yes     Systolic Blood Pressure: 126 mmHg     Is BP treated: No     HDL Cholesterol: 33 mg/dL     Total Cholesterol: 172 mg/dL    A/P: Diabetes longstanding currently uncontrolled, but reported home sugars reveal improvement. Patient is able to verbalize appropriate hypoglycemia management plan. Patient is adherent with medication. Control is suboptimal due to dietary indiscretion and physical inactivity.   -Continued current regimen.  -Extensively discussed pathophysiology of diabetes, recommended lifestyle interventions, dietary effects on blood sugar control -Counseled on s/sx of and management of hypoglycemia -Next A1C anticipated 08/2019.   Written patient instructions provided. Total time in face to face counseling 30 minutes.   Follow up Pharmacist Clinic Visit 07/24/2019.  Butch Penny, PharmD, CPP Clinical Pharmacist [facility] [phone]

## 2019-08-16 ENCOUNTER — Ambulatory Visit: Payer: Self-pay | Attending: Nurse Practitioner | Admitting: Nurse Practitioner

## 2019-08-16 ENCOUNTER — Encounter: Payer: Self-pay | Admitting: Nurse Practitioner

## 2019-08-16 ENCOUNTER — Other Ambulatory Visit: Payer: Self-pay

## 2019-08-16 DIAGNOSIS — Z794 Long term (current) use of insulin: Secondary | ICD-10-CM

## 2019-08-16 DIAGNOSIS — E785 Hyperlipidemia, unspecified: Secondary | ICD-10-CM

## 2019-08-16 DIAGNOSIS — F17210 Nicotine dependence, cigarettes, uncomplicated: Secondary | ICD-10-CM

## 2019-08-16 DIAGNOSIS — E1165 Type 2 diabetes mellitus with hyperglycemia: Secondary | ICD-10-CM

## 2019-08-16 NOTE — Progress Notes (Signed)
Virtual Visit via Telephone Note Due to national recommendations of social distancing due to COVID 19, telehealth visit is felt to be most appropriate for this patient at this time.  I discussed the limitations, risks, security and privacy concerns of performing an evaluation and management service by telephone and the availability of in person appointments. I also discussed with the patient that there may be a patient responsible charge related to this service. The patient expressed understanding and agreed to proceed.    I connected with Christian Navarro on 08/16/19  at  11:10 AM EST  EDT by telephone and verified that I am speaking with the correct person using two identifiers.   Consent I discussed the limitations, risks, security and privacy concerns of performing an evaluation and management service by telephone and the availability of in person appointments. I also discussed with the patient that there may be a patient responsible charge related to this service. The patient expressed understanding and agreed to proceed.   Location of Patient: Private Residence    [facility] of Provider[facility]    Persons participating in Telemedicine visit: Bertram Denver FNP-BC YY Colon CMA Leticia Clas Sheliah Plane     History of Present Illness: Telemedicine visit for: F/U  has a past medical history of Chronic back pain, Chronic lower back pain, Chronic neck pain, DDD (degenerative disc disease), lumbar, Diabetes mellitus, Genital herpes, and Hyperlipidemia.  DM Type 2 Poorly controlled. Average readings Fasting readings: 130-200s. He is no diet or exercise adherent. Endorsed medication adherence taking Lantus 50 units nightly, Humalog 20 units TID, Janumet 50-1000 mg BID. Overdue for eye exam. Endorses adherence taking renal dose ACE, STATIN and Fenofibrate.  Will add amaryl 4 mg daily to regimen. Many of his medications appear to be expired based on pharmacy record. It does  not appear he is picking his medications up as instructed.  Lab Results  Component Value Date   HGBA1C 10.9 (H) 08/20/2019   Lab Results  Component Value Date   HGBA1C 10.1 (A) 05/18/2019   Dyslipidemia LDL at goal of <70. Current medications: atorvastatin 40 mg daily and fenofibrate 145 mg daily.  Lab Results  Component Value Date   LDLCALC 69 08/20/2019      Past Medical History:  Diagnosis Date  . Chronic back pain   . Chronic lower back pain   . Chronic neck pain   . DDD (degenerative disc disease), lumbar   . Diabetes mellitus   . Genital herpes   . Hyperlipidemia     Past Surgical History:  Procedure Laterality Date  . SHOULDER SURGERY      History reviewed. No pertinent family history.  Social History   Socioeconomic History  . Marital status: Married    Spouse name: Not on file  . Number of children: Not on file  . Years of education: Not on file  . Highest education level: Not on file  Occupational History  . Not on file  Tobacco Use  . Smoking status: Current Every Day Smoker    Packs/day: 1.00    Types: Cigarettes  . Smokeless tobacco: Never Used  Substance and Sexual Activity  . Alcohol use: No  . Drug use: No  . Sexual activity: Yes  Other Topics Concern  . Not on file  Social History Narrative  . Not on file   Social Determinants of Health   Financial Resource Strain:   . Difficulty of Paying Living Expenses:   Food  Insecurity:   . Worried About Charity fundraiser in the Last Year:   . Arboriculturist in the Last Year:   Transportation Needs:   . Film/video editor (Medical):   Marland Kitchen Lack of Transportation (Non-Medical):   Physical Activity:   . Days of Exercise per Week:   . Minutes of Exercise per Session:   Stress:   . Feeling of Stress :   Social Connections:   . Frequency of Communication with Friends and Family:   . Frequency of Social Gatherings with Friends and Family:   . Attends Religious Services:   . Active Member  of Clubs or Organizations:   . Attends Archivist Meetings:   Marland Kitchen Marital Status:      Observations/Objective: Awake, alert and oriented x 3   Review of Systems  Constitutional: Negative for fever, malaise/fatigue and weight loss.  HENT: Negative.  Negative for nosebleeds.   Eyes: Negative.  Negative for blurred vision, double vision and photophobia.  Respiratory: Negative.  Negative for cough and shortness of breath.   Cardiovascular: Negative.  Negative for chest pain, palpitations and leg swelling.  Gastrointestinal: Negative.  Negative for heartburn, nausea and vomiting.  Musculoskeletal: Negative.  Negative for myalgias.  Neurological: Negative.  Negative for dizziness, focal weakness, seizures and headaches.  Psychiatric/Behavioral: Negative.  Negative for suicidal ideas.    Assessment and Plan: Christian Navarro was seen today for follow-up.  Diagnoses and all orders for this visit:  Poorly controlled type 2 diabetes mellitus (HCC) -     glimepiride (AMARYL) 4 MG tablet; Take 1 tablet (4 mg total) by mouth daily before breakfast. Continue blood sugar control as discussed in office today, low carbohydrate diet, and regular physical exercise as tolerated, 150 minutes per week (30 min each day, 5 days per week, or 50 min 3 days per week). Keep blood sugar logs with fasting goal of 90-130 mg/dl, post prandial (after you eat) less than 180.  For Hypoglycemia: BS <60 and Hyperglycemia BS >400; contact the clinic ASAP. Annual eye exams and foot exams are recommended.  Dyslipidemia, goal LDL below 70 INSTRUCTIONS: Work on a low fat, heart healthy diet and participate in regular aerobic exercise program by working out at least 150 minutes per week; 5 days a week-30 minutes per day. Avoid red meat/beef/steak,  fried foods. junk foods, sodas, sugary drinks, unhealthy snacking, alcohol and smoking.  Drink at least 80 oz of water per day and monitor your carbohydrate intake daily.       Follow Up Instructions Return in about 3 months (around 11/16/2019).     I discussed the assessment and treatment plan with the patient. The patient was provided an opportunity to ask questions and all were answered. The patient agreed with the plan and demonstrated an understanding of the instructions.   The patient was advised to call back or seek an in-person evaluation if the symptoms worsen or if the condition fails to improve as anticipated.  I provided 17 minutes of non-face-to-face time during this encounter including median intraservice time, reviewing previous notes, labs, imaging, medications and explaining diagnosis and management.  Gildardo Pounds, FNP-BC

## 2019-08-17 ENCOUNTER — Ambulatory Visit: Payer: No Typology Code available for payment source | Admitting: Nurse Practitioner

## 2019-08-20 ENCOUNTER — Ambulatory Visit: Payer: Self-pay | Attending: Nurse Practitioner

## 2019-08-20 ENCOUNTER — Other Ambulatory Visit: Payer: Self-pay

## 2019-08-20 DIAGNOSIS — E782 Mixed hyperlipidemia: Secondary | ICD-10-CM

## 2019-08-20 DIAGNOSIS — E1165 Type 2 diabetes mellitus with hyperglycemia: Secondary | ICD-10-CM

## 2019-08-21 LAB — CMP14+EGFR
ALT: 21 IU/L (ref 0–44)
AST: 18 IU/L (ref 0–40)
Albumin/Globulin Ratio: 1.4 (ref 1.2–2.2)
Albumin: 4.2 g/dL (ref 3.8–4.9)
Alkaline Phosphatase: 79 IU/L (ref 39–117)
BUN/Creatinine Ratio: 13 (ref 9–20)
BUN: 15 mg/dL (ref 6–24)
Bilirubin Total: <0.2 mg/dL (ref 0.0–1.2)
CO2: 23 mmol/L (ref 20–29)
Calcium: 9.4 mg/dL (ref 8.7–10.2)
Chloride: 102 mmol/L (ref 96–106)
Creatinine, Ser: 1.19 mg/dL (ref 0.76–1.27)
GFR calc Af Amer: 78 mL/min/{1.73_m2} (ref >59)
GFR calc non Af Amer: 67 mL/min/{1.73_m2} (ref >59)
Globulin, Total: 3 g/dL (ref 1.5–4.5)
Glucose: 104 mg/dL — ABNORMAL HIGH (ref 65–99)
Potassium: 4.4 mmol/L (ref 3.5–5.2)
Sodium: 139 mmol/L (ref 134–144)
Total Protein: 7.2 g/dL (ref 6.0–8.5)

## 2019-08-21 LAB — LIPID PANEL
Chol/HDL Ratio: 4.9 ratio (ref 0.0–5.0)
Cholesterol, Total: 147 mg/dL (ref 100–199)
HDL: 30 mg/dL — ABNORMAL LOW (ref >39)
LDL Chol Calc (NIH): 69 mg/dL (ref 0–99)
Triglycerides: 297 mg/dL — ABNORMAL HIGH (ref 0–149)
VLDL Cholesterol Cal: 48 mg/dL — ABNORMAL HIGH (ref 5–40)

## 2019-08-21 LAB — HEMOGLOBIN A1C
Est. average glucose Bld gHb Est-mCnc: 266 mg/dL
Hgb A1c MFr Bld: 10.9 % — ABNORMAL HIGH (ref 4.8–5.6)

## 2019-08-31 MED FILL — ?ATORVASTATIN 40MG TABLET: 40 | 30 days supply | Qty: 30 | Fill #2

## 2019-08-31 MED FILL — ?HUMALOG 100 UNITS/ML KWIKP: 100 | 30 days supply | Qty: 18 | Fill #2

## 2019-08-31 MED FILL — ?BASAGLAR 100 UNITS/ML KWPE: 100 | 30 days supply | Qty: 15 | Fill #1

## 2019-08-31 MED FILL — ?FENOFIBRATE 145 MG TABLET: 145 | 30 days supply | Qty: 30 | Fill #6

## 2019-09-13 ENCOUNTER — Encounter: Payer: Self-pay | Admitting: Nurse Practitioner

## 2019-09-13 MED ORDER — GLIMEPIRIDE 4 MG PO TABS: 4.0000 mg | ORAL_TABLET | Freq: Every day | ORAL | 3 refills | Status: DC

## 2019-10-12 MED FILL — ?BASAGLAR 100 UNITS/ML KWPE: 100 | 30 days supply | Qty: 15 | Fill #2

## 2019-10-12 MED FILL — ?ATORVASTATIN 40MG TABLET: 40 | 30 days supply | Qty: 30 | Fill #3

## 2019-10-12 MED FILL — ?FENOFIBRATE 145 MG TABLET: 145 | 30 days supply | Qty: 30 | Fill #7

## 2019-10-12 MED FILL — ?HUMALOG 100 UNITS/ML KWIKP: 100 | 30 days supply | Qty: 18 | Fill #3

## 2019-11-19 ENCOUNTER — Other Ambulatory Visit: Payer: Self-pay | Admitting: Nurse Practitioner

## 2019-11-19 DIAGNOSIS — E782 Mixed hyperlipidemia: Secondary | ICD-10-CM

## 2019-11-20 ENCOUNTER — Ambulatory Visit: Payer: No Typology Code available for payment source | Admitting: Nurse Practitioner

## 2019-12-25 ENCOUNTER — Other Ambulatory Visit: Payer: Self-pay | Admitting: Nurse Practitioner

## 2019-12-25 DIAGNOSIS — E1165 Type 2 diabetes mellitus with hyperglycemia: Secondary | ICD-10-CM

## 2019-12-25 MED FILL — ?HUMALOG 100 UNITS/ML KWIKP: 100 | 30 days supply | Qty: 18 | Fill #5

## 2019-12-25 MED FILL — $LANTUS SOLOSTAR 100 UNITS/: 100 | 30 days supply | Qty: 15 | Fill #3

## 2019-12-25 MED FILL — SILDENAFIL CITRATE 100 MG T: 100 | 30 days supply | Qty: 10 | Fill #2

## 2019-12-26 MED FILL — JANUMET 50-1,000 MG TABLET: 50-1000 | 30 days supply | Qty: 60 | Fill #0

## 2020-01-18 MED FILL — ?HUMALOG 100 UNITS/ML KWIKP: 100 | 30 days supply | Qty: 18 | Fill #6

## 2020-01-18 MED FILL — JANUMET 50-1,000 MG TABLET: 50-1000 | 30 days supply | Qty: 60 | Fill #0

## 2020-01-18 MED FILL — ?ATORVASTATIN 40MG TABLET: 40 | 30 days supply | Qty: 30 | Fill #5

## 2020-01-29 ENCOUNTER — Other Ambulatory Visit: Payer: Self-pay

## 2020-01-29 ENCOUNTER — Other Ambulatory Visit: Payer: Self-pay | Admitting: Nurse Practitioner

## 2020-01-29 ENCOUNTER — Encounter: Payer: Self-pay | Admitting: Nurse Practitioner

## 2020-01-29 ENCOUNTER — Ambulatory Visit: Payer: Self-pay | Attending: Nurse Practitioner | Admitting: Nurse Practitioner

## 2020-01-29 VITALS — BP 147/78 | HR 86 | Temp 97.7°F | Wt 186.0 lb

## 2020-01-29 DIAGNOSIS — M5137 Other intervertebral disc degeneration, lumbosacral region: Secondary | ICD-10-CM

## 2020-01-29 DIAGNOSIS — IMO0002 Reserved for concepts with insufficient information to code with codable children: Secondary | ICD-10-CM

## 2020-01-29 DIAGNOSIS — M542 Cervicalgia: Secondary | ICD-10-CM

## 2020-01-29 DIAGNOSIS — R911 Solitary pulmonary nodule: Secondary | ICD-10-CM

## 2020-01-29 DIAGNOSIS — E1165 Type 2 diabetes mellitus with hyperglycemia: Secondary | ICD-10-CM

## 2020-01-29 DIAGNOSIS — E785 Hyperlipidemia, unspecified: Secondary | ICD-10-CM

## 2020-01-29 DIAGNOSIS — Z794 Long term (current) use of insulin: Secondary | ICD-10-CM

## 2020-01-29 DIAGNOSIS — E118 Type 2 diabetes mellitus with unspecified complications: Secondary | ICD-10-CM

## 2020-01-29 DIAGNOSIS — M549 Dorsalgia, unspecified: Secondary | ICD-10-CM

## 2020-01-29 DIAGNOSIS — G8929 Other chronic pain: Secondary | ICD-10-CM

## 2020-01-29 LAB — POCT GLYCOSYLATED HEMOGLOBIN (HGB A1C): Hemoglobin A1C: 10.6 % — AB (ref 4.0–5.6)

## 2020-01-29 LAB — GLUCOSE, POCT (MANUAL RESULT ENTRY): POC Glucose: 317 mg/dl — AB (ref 70–99)

## 2020-01-29 MED ORDER — JANUMET 50-1000 MG PO TABS: ORAL_TABLET | ORAL | 2 refills | Status: DC

## 2020-01-29 MED ORDER — TRUEPLUS LANCETS 28G MISC: 1.0000 | Freq: Three times a day (TID) | 12 refills | Status: DC

## 2020-01-29 MED ORDER — PREGABALIN 75 MG PO CAPS: 75.0000 mg | ORAL_CAPSULE | Freq: Two times a day (BID) | ORAL | 0 refills | Status: DC

## 2020-01-29 MED ORDER — INSULIN LISPRO (1 UNIT DIAL) 100 UNIT/ML (KWIKPEN): 20.0000 [IU] | PEN_INJECTOR | Freq: Three times a day (TID) | SUBCUTANEOUS | 3 refills | Status: DC

## 2020-01-29 MED ORDER — TRUE METRIX METER W/DEVICE KIT: PACK | 0 refills | Status: DC

## 2020-01-29 MED ORDER — GLIMEPIRIDE 4 MG PO TABS: 4.0000 mg | ORAL_TABLET | Freq: Every day | ORAL | 3 refills | Status: DC

## 2020-01-29 MED ORDER — TRUEPLUS 5-BEVEL PEN NEEDLES 31G X 5 MM MISC: 11 refills | Status: DC

## 2020-01-29 MED ORDER — TRUE METRIX BLOOD GLUCOSE TEST VI STRP: ORAL_STRIP | 12 refills | Status: DC

## 2020-01-29 MED ORDER — IBUPROFEN 800 MG PO TABS: 800.0000 mg | ORAL_TABLET | Freq: Three times a day (TID) | ORAL | 2 refills | Status: DC | PRN

## 2020-01-29 MED ORDER — CYCLOBENZAPRINE HCL 10 MG PO TABS: 10.0000 mg | ORAL_TABLET | Freq: Three times a day (TID) | ORAL | 2 refills | Status: DC | PRN

## 2020-01-29 MED ORDER — INSULIN GLARGINE 100 UNIT/ML SOLOSTAR PEN: 50.0000 [IU] | PEN_INJECTOR | Freq: Every day | SUBCUTANEOUS | 11 refills | Status: DC

## 2020-01-29 MED ORDER — ATORVASTATIN CALCIUM 40 MG PO TABS: 40.0000 mg | ORAL_TABLET | Freq: Every day | ORAL | 2 refills | Status: DC

## 2020-01-29 MED ORDER — FENOFIBRATE 145 MG PO TABS: 145.0000 mg | ORAL_TABLET | Freq: Every day | ORAL | 0 refills | Status: DC

## 2020-01-29 MED ORDER — LISINOPRIL 2.5 MG PO TABS: 2.5000 mg | ORAL_TABLET | Freq: Every day | ORAL | 1 refills | Status: DC

## 2020-01-29 MED FILL — !TRUE METRIX BLOOD GLUCOSE: 1 days supply | Qty: 1 | Fill #0

## 2020-01-29 MED FILL — FENOFIBRATE 145 MG TABLET: 145 | 30 days supply | Qty: 30 | Fill #0
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
## 2020-01-30 LAB — MICROALBUMIN / CREATININE URINE RATIO
Creatinine, Urine: 73.8 mg/dL
Microalb/Creat Ratio: 8 mg/g creat (ref 0–29)
Microalbumin, Urine: 5.6 ug/mL

## 2020-01-30 LAB — CMP14+EGFR
ALT: 24 IU/L (ref 0–44)
AST: 20 IU/L (ref 0–40)
Albumin/Globulin Ratio: 1.4 (ref 1.2–2.2)
Albumin: 4.3 g/dL (ref 3.8–4.9)
Alkaline Phosphatase: 110 IU/L (ref 48–121)
BUN/Creatinine Ratio: 21 — ABNORMAL HIGH (ref 9–20)
BUN: 22 mg/dL (ref 6–24)
Bilirubin Total: <0.2 mg/dL (ref 0.0–1.2)
CO2: 23 mmol/L (ref 20–29)
Calcium: 9.6 mg/dL (ref 8.7–10.2)
Chloride: 98 mmol/L (ref 96–106)
Creatinine, Ser: 1.06 mg/dL (ref 0.76–1.27)
GFR calc Af Amer: 90 mL/min/{1.73_m2} (ref >59)
GFR calc non Af Amer: 78 mL/min/{1.73_m2} (ref >59)
Globulin, Total: 3.1 g/dL (ref 1.5–4.5)
Glucose: 403 mg/dL — ABNORMAL HIGH (ref 65–99)
Potassium: 4.9 mmol/L (ref 3.5–5.2)
Sodium: 135 mmol/L (ref 134–144)
Total Protein: 7.4 g/dL (ref 6.0–8.5)

## 2020-01-30 LAB — LIPID PANEL
Chol/HDL Ratio: 7.1 ratio — ABNORMAL HIGH (ref 0.0–5.0)
Cholesterol, Total: 184 mg/dL (ref 100–199)
HDL: 26 mg/dL — ABNORMAL LOW (ref >39)
LDL Chol Calc (NIH): 85 mg/dL (ref 0–99)
Triglycerides: 444 mg/dL — ABNORMAL HIGH (ref 0–149)
VLDL Cholesterol Cal: 73 mg/dL — ABNORMAL HIGH (ref 5–40)

## 2020-01-30 MED FILL — GLIMEPIRIDE 4 MG TABS: 4 | 30 days supply | Qty: 30 | Fill #0

## 2020-01-30 MED FILL — CYCLOBENZAPRINE 10 MG TAB: 10 | 20 days supply | Qty: 60 | Fill #0

## 2020-01-30 MED FILL — TRUE METRIX TEST STRIP: 30 days supply | Qty: 100 | Fill #0

## 2020-01-30 MED FILL — TRUEPLUS 5-BEVEL PEN NEEDLE: 31G X 5 MM | 25 days supply | Qty: 100 | Fill #0

## 2020-01-30 MED FILL — PREGABALIN 75 MG CAPS: 75 | 30 days supply | Qty: 60 | Fill #0

## 2020-01-30 MED FILL — LISINOPRIL 2.5 MG TABLET: 2.5 | 30 days supply | Qty: 30 | Fill #0

## 2020-01-30 MED FILL — IBUPROFEN 800 MG TABLET: 800 | 20 days supply | Qty: 60 | Fill #0

## 2020-01-30 MED FILL — TRUEplus LANCETS 28G MISC: 30 days supply | Qty: 100 | Fill #0

## 2020-02-22 MED FILL — $LANTUS SOLOSTAR 100 UNITS/: 100 | 30 days supply | Qty: 15 | Fill #0

## 2020-02-22 MED FILL — ?ATORVASTATIN 40MG TABLET: 40 | 30 days supply | Qty: 30 | Fill #0

## 2020-02-22 MED FILL — ?HUMALOG 100 UNITS/ML KWIKP: 100 | 24 days supply | Qty: 15 | Fill #0

## 2020-02-25 NOTE — Progress Notes (Signed)
S:    PCP: Zelda  Patient arrives in good spirits. Presents for diabetes evaluation, education, and management.  Patient was referred and last seen by Primary Care Provider on 01/29/20. Today, patient reports nonadherence with insulin. Last insulin injection was Humalog yesterday morning. Patient reports eating a sandwich for lunch today. POCT glucose very elevated in clinic today. Despite instruction to wait, patient proceeded to inject 34 units of his home Humalog into his leg after seeing this result. It appears patient is reusing pen needles.   Patient reports Diabetes was diagnosed in 1998.   Family/Social History:  FHx: no pertinent positives Tobacco: current every day smoker Alcohol: denies  Insurance coverage/medication affordability: self pay  Denies medication adherence with insulin.   Current diabetes medications include: Janumet 50-1000 mg BID, glimepiride 4 mg daily before breakfast, Lantus 50 units daily, Humalog 20 units TID - Patient self doses insulin and admits only taking his insulin when his "diabetes is uncontrolled." Patient takes Humalog 30 units rather than prescribed 20 units.  Current hypertension medications include: lisinopril 2.5 mg daily Current hyperlipidemia medications include: atorvastatin 40 mg daily, fenofibrate 145 mg daily  Patient reports hypoglycemic events - one episode of BG 80 a couple weeks ago. Took candy and symptoms resolved.   Patient reported dietary habits: Eats 3 meals/day Diet: 3 meals per day - Breakfast: cinnamon bread, cheese - Lunch: chicken, fish, salad - Dinner: 3am, chicken, steak, burger, rice - Snacks: Snickers every 2 or 3 days - Drinks: water, orange juice   Patient reports nocturia (nighttime urination). Uses restroom 5x at night.  Patient reports neuropathy (nerve pain). Patient reports visual changes. Patient reports self foot exams.     O:  POCT BG: 506; 483 on recheck after self-administration of  Humalog   Lab Results  Component Value Date   HGBA1C 10.6 (A) 01/29/2020   Vitals:   02/26/20 1500  BP: 116/71  Pulse: 92    Lipid Panel     Component Value Date/Time   CHOL 184 01/29/2020 1641   TRIG 444 (H) 01/29/2020 1641   HDL 26 (L) 01/29/2020 1641   CHOLHDL 7.1 (H) 01/29/2020 1641   CHOLHDL 6.5 (H) 04/05/2016 1056   VLDL 64 (H) 04/05/2016 1056   LDLCALC 85 01/29/2020 1641   LDLDIRECT 94.7 05/25/2013 1612    Home fasting blood sugars: 170s-400s, averages 200s. Did not bring meter with him to this visit.   Clinical Atherosclerotic Cardiovascular Disease (ASCVD): No  The 10-year ASCVD risk score Denman George DC Jr., et al., 2013) is: 38.2%   Values used to calculate the score:     Age: 59 years     Sex: Male     Is Non-Hispanic African American: No     Diabetic: Yes     Tobacco smoker: Yes     Systolic Blood Pressure: 147 mmHg     Is BP treated: No     HDL Cholesterol: 26 mg/dL     Total Cholesterol: 184 mg/dL    A/P: Diabetes longstanding currently uncontrolled with most recent A1c 10.6 on 01/29/20. Control is suboptimal due to nonadherence with medications. Patient frequently self adjusts insulin and does not adhere to provider instruction. Consulted PCP today in clinic who saw the patient briefly. POCT UA obtained secondary to clinic hyperglycemia. Results were negative for ketones.  -Patient encouraged to take insulin daily. Take Lantus 50 units daily and Humalog 30 units TIDAC.  -Counseled patient to not reuse insulin pen needles and to  inject in stomach rather than legs.  -Extensively discussed pathophysiology of diabetes, recommended lifestyle interventions, dietary effects on blood sugar control. -Counseled on s/sx of and management of hypoglycemia -Next A1C anticipated 04/30/2020 at PCP visit.   Written patient instructions provided.  Total time in face to face counseling 30 minutes.   Follow up with PCP 04/30/2020.     Pervis Hocking, PharmD PGY1 Pharmacy  Resident 02/26/2020 3:19 PM

## 2020-02-26 ENCOUNTER — Other Ambulatory Visit: Payer: Self-pay | Admitting: Family Medicine

## 2020-02-26 ENCOUNTER — Encounter: Payer: Self-pay | Admitting: Pharmacist

## 2020-02-26 ENCOUNTER — Other Ambulatory Visit: Payer: Self-pay

## 2020-02-26 ENCOUNTER — Ambulatory Visit: Payer: Self-pay | Attending: Family Medicine | Admitting: Pharmacist

## 2020-02-26 VITALS — BP 116/71 | HR 92

## 2020-02-26 DIAGNOSIS — E118 Type 2 diabetes mellitus with unspecified complications: Secondary | ICD-10-CM

## 2020-02-26 DIAGNOSIS — Z794 Long term (current) use of insulin: Secondary | ICD-10-CM

## 2020-02-26 DIAGNOSIS — IMO0002 Reserved for concepts with insufficient information to code with codable children: Secondary | ICD-10-CM

## 2020-02-26 DIAGNOSIS — E1165 Type 2 diabetes mellitus with hyperglycemia: Secondary | ICD-10-CM

## 2020-02-26 LAB — POCT URINALYSIS DIP (CLINITEK)
Bilirubin, UA: NEGATIVE
Blood, UA: NEGATIVE
Glucose, UA: >=1000 mg/dL — AB
Ketones, POC UA: NEGATIVE mg/dL
Leukocytes, UA: NEGATIVE
Nitrite, UA: NEGATIVE
POC PROTEIN,UA: NEGATIVE
Spec Grav, UA: 1.01 (ref 1.010–1.025)
Urobilinogen, UA: 0.2 E.U./dL
pH, UA: 5.5 (ref 5.0–8.0)

## 2020-02-26 LAB — GLUCOSE, POCT (MANUAL RESULT ENTRY)
POC Glucose: 506 mg/dl — AB (ref 70–99)
POC Glucose: 483 mg/dl — AB (ref 70–99)

## 2020-02-26 MED ORDER — INSULIN LISPRO (1 UNIT DIAL) 100 UNIT/ML (KWIKPEN): 30.0000 [IU] | PEN_INJECTOR | Freq: Three times a day (TID) | SUBCUTANEOUS | 3 refills | Status: DC

## 2020-02-29 MED FILL — $JANUMET 50-1000 MG TABLET: 50-1000 | 90 days supply | Qty: 180 | Fill #0

## 2020-03-20 MED FILL — !LANTUS SOLOSTAR 100UNITS/M: 100 | 30 days supply | Qty: 15 | Fill #1

## 2020-03-20 MED FILL — ?ATORVASTATIN 40MG TABLET: 40 | 30 days supply | Qty: 30 | Fill #1

## 2020-03-20 MED FILL — ?HUMALOG 100 UNITS/ML KWIKP: 100 | 66 days supply | Qty: 60 | Fill #0

## 2020-03-20 MED FILL — TRUE METRIX TEST STRIP: 30 days supply | Qty: 100 | Fill #1

## 2020-04-29 MED FILL — TRUE METRIX TEST STRIP: 30 days supply | Qty: 100 | Fill #2

## 2020-04-30 ENCOUNTER — Encounter: Payer: Self-pay | Admitting: Nurse Practitioner

## 2020-04-30 ENCOUNTER — Other Ambulatory Visit: Payer: Self-pay

## 2020-04-30 ENCOUNTER — Ambulatory Visit: Payer: No Typology Code available for payment source | Attending: Nurse Practitioner | Admitting: Nurse Practitioner

## 2020-04-30 ENCOUNTER — Other Ambulatory Visit: Payer: Self-pay | Admitting: Nurse Practitioner

## 2020-04-30 VITALS — BP 133/73 | HR 91 | Temp 97.0°F | Ht 66.0 in | Wt 188.0 lb

## 2020-04-30 DIAGNOSIS — E785 Hyperlipidemia, unspecified: Secondary | ICD-10-CM

## 2020-04-30 DIAGNOSIS — IMO0002 Reserved for concepts with insufficient information to code with codable children: Secondary | ICD-10-CM

## 2020-04-30 DIAGNOSIS — B372 Candidiasis of skin and nail: Secondary | ICD-10-CM

## 2020-04-30 DIAGNOSIS — E1165 Type 2 diabetes mellitus with hyperglycemia: Secondary | ICD-10-CM

## 2020-04-30 DIAGNOSIS — Z794 Long term (current) use of insulin: Secondary | ICD-10-CM

## 2020-04-30 DIAGNOSIS — M5137 Other intervertebral disc degeneration, lumbosacral region: Secondary | ICD-10-CM

## 2020-04-30 DIAGNOSIS — E118 Type 2 diabetes mellitus with unspecified complications: Secondary | ICD-10-CM

## 2020-04-30 LAB — GLUCOSE, POCT (MANUAL RESULT ENTRY): POC Glucose: 122 mg/dl — AB (ref 70–99)

## 2020-04-30 LAB — POCT GLYCOSYLATED HEMOGLOBIN (HGB A1C): Hemoglobin A1C: 11.5 % — AB (ref 4.0–5.6)

## 2020-04-30 MED ORDER — LISINOPRIL 2.5 MG PO TABS: 2.5000 mg | ORAL_TABLET | Freq: Every day | ORAL | 1 refills | Status: DC

## 2020-04-30 MED ORDER — JANUMET 50-1000 MG PO TABS: ORAL_TABLET | ORAL | 2 refills | Status: DC

## 2020-04-30 MED ORDER — GLIMEPIRIDE 4 MG PO TABS: 4.0000 mg | ORAL_TABLET | Freq: Every day | ORAL | 3 refills | Status: DC

## 2020-04-30 MED ORDER — ATORVASTATIN CALCIUM 40 MG PO TABS: 40.0000 mg | ORAL_TABLET | Freq: Every day | ORAL | 2 refills | Status: DC

## 2020-04-30 MED ORDER — PREGABALIN 75 MG PO CAPS: 75.0000 mg | ORAL_CAPSULE | Freq: Two times a day (BID) | ORAL | 1 refills | Status: DC

## 2020-04-30 MED ORDER — NYSTATIN 100000 UNIT/GM EX POWD: 1.0000 "application " | Freq: Two times a day (BID) | CUTANEOUS | 1 refills | Status: DC

## 2020-04-30 MED ORDER — FENOFIBRATE 145 MG PO TABS: 145.0000 mg | ORAL_TABLET | Freq: Every day | ORAL | 1 refills | Status: DC
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
## 2020-05-01 MED FILL — FENOFIBRATE 145 MG TABLET: 145 | 30 days supply | Qty: 30 | Fill #0

## 2020-05-01 MED FILL — SILDENAFIL CITRATE 100 MG T: 100 | 30 days supply | Qty: 10 | Fill #3

## 2020-05-01 MED FILL — LISINOPRIL 2.5 MG TABLET: 2.5 | 30 days supply | Qty: 30 | Fill #0

## 2020-05-01 MED FILL — PREGABALIN 75 MG CAPS: 75 | 30 days supply | Qty: 60 | Fill #0

## 2020-05-01 MED FILL — NYSTATIN 100000 UNIT/GM POW: 100000 | 30 days supply | Qty: 60 | Fill #0

## 2020-05-01 MED FILL — GLIMEPIRIDE 4 MG TABS: 4 | 30 days supply | Qty: 30 | Fill #0

## 2020-05-01 MED FILL — ?ATORVASTATIN 40MG TABLET: 40 | 30 days supply | Qty: 30 | Fill #2

## 2020-05-01 MED FILL — JANUMET 50-1,000 MG TABLET: 50-1000 | 30 days supply | Qty: 60 | Fill #1

## 2020-05-27 NOTE — Progress Notes (Incomplete)
S:     PCP: Bertram [place]  Patient arrives in good spirits.  Presents for diabetes evaluation, education, and management Patient was referred and last seen by Primary Care Provider on 04/30/20 at which patient was instructed to start glimepiride 4 mg daily since he was unaware it was prescribed.  Patient reports Diabetes was diagnosed in 1998.   Today, patient reports ***  Last visit  - not taking his medications daily as prescribed. Not aware that he had been prescribed glimepiride several months ago  A1C =11.5% Check Clinic BG Review medications and adherence (timing of meds, etc.)  Ate or drank anything prior to visit today? At home BGs?  Marland Kitchen Highs . Lows  Hyperglycemia sx (nocturia, neuropathy, visual changes, foot exams) Hypoglycemia symptoms (dizziness, shaky, sweating, hungry, confusion) Diet Exercise  Plan: -GLP then discontinue janumet and add metformin XR 500 mg - 2 tablets BID -increase glimepiride to 8 mg daily -titrate insulin  Patient reports Diabetes was diagnosed in ***.   Family/Social History: ***  Insurance coverage/medication affordability: [employer] discount  Medication adherence reported *** .   Current diabetes medications include: glimepiride 4 mg daily, janumet 50-1000 mg BID, lantus 50 units nightly and humalog 30 units BID Current hypertension medications include: lisinopril 2.5 mg daily (renal protection) Current hyperlipidemia medications include: Fenofibrate 145 mg daily and atorvastatin 40 mg daily  Patient {Actions; denies-reports:120008} hypoglycemic events.  Patient reported dietary habits: Eats 3 meals/day Diet: 3 meals per day - Breakfast: cinnamon bread, cheese - Lunch: chicken, fish, salad - Dinner: 3am, chicken, steak, burger, rice - Snacks: Snickers every 2 or 3 days - Drinks: water, orange juice  Patient-reported exercise habits: ***   Patient {Actions; denies-reports:120008} nocturia (nighttime urination).  Patient  {Actions; denies-reports:120008} neuropathy (nerve pain). Patient {Actions; denies-reports:120008} visual changes. Patient {Actions; denies-reports:120008} self foot exams.     O:    Lab Results  Component Value Date   HGBA1C 11.5 (A) 04/30/2020   There were no vitals filed for this visit.  Lipid Panel     Component Value Date/Time   CHOL 184 01/29/2020 1641   TRIG 444 (H) 01/29/2020 1641   HDL 26 (L) 01/29/2020 1641   CHOLHDL 7.1 (H) 01/29/2020 1641   CHOLHDL 6.5 (H) 04/05/2016 1056   VLDL 64 (H) 04/05/2016 1056   LDLCALC 85 01/29/2020 1641   LDLDIRECT 94.7 05/25/2013 1612    Home fasting blood sugars: ***  2 hour post-meal/random blood sugars: ***.   Clinical Atherosclerotic Cardiovascular Disease (ASCVD): {YES/NO:21197} The 10-year ASCVD risk score Denman George DC Jr., et al., 2013) is: 34.4%   Values used to calculate the score:     Age: 59 years     Sex: Male     Is Non-Hispanic African American: No     Diabetic: Yes     Tobacco smoker: Yes     Systolic Blood Pressure: 133 mmHg     Is BP treated: No     HDL Cholesterol: 26 mg/dL     Total Cholesterol: 184 mg/dL    A/P: Diabetes longstanding*** currently ***. Patient is *** able to verbalize appropriate hypoglycemia management plan. Medication adherence appears ***. Control is suboptimal due to ***. -{Meds adjust:18428} basal insulin *** (insulin ***). Patient will continue to titrate 1 unit every *** days if fasting blood sugar > 100mg /dl until fasting blood sugars reach goal or next visit.  -{Meds adjust:18428}  rapid insulin *** (insulin ***) to ***.  -{Meds adjust:18428} GLP-1 *** (generic name***) to ***.  -{  Meds adjust:18428} SGLT2-I *** (generic name***) to ***. Counseled on sick day rules for ***. -Extensively discussed pathophysiology of diabetes, recommended lifestyle interventions, dietary effects on blood sugar control -Counseled on s/sx of and management of hypoglycemia -Next A1C anticipated ***.    ASCVD risk - primary***secondary prevention in patient with diabetes. Last LDL {Is/is not:9024} controlled. ASCVD risk score {Is/is not:9024} >20%  - {Desc; low/moderate/high:110033} intensity statin indicated. Aspirin {Is/is not:9024} indicated.  -{Meds adjust:18428} aspirin *** mg  -{Meds adjust:18428} ***statin *** mg.   Hypertension longstanding*** currently ***.  Blood pressure goal = *** mmHg. Medication adherence ***.  Blood pressure control is suboptimal due to ***. -***  Written patient instructions provided.  Total time in face to face counseling *** minutes.   Follow up Pharmacist/PCP*** Clinic Visit in ***.    Fabio Neighbors, PharmD, BCPS PGY2 Ambulatory Care Resident [facility]

## 2020-05-28 ENCOUNTER — Ambulatory Visit: Payer: No Typology Code available for payment source | Admitting: Pharmacist

## 2020-06-04 MED FILL — TRUE METRIX GLUCOSE TEST ST: 30 days supply | Qty: 100 | Fill #3

## 2020-06-24 ENCOUNTER — Other Ambulatory Visit: Payer: Self-pay

## 2020-06-24 ENCOUNTER — Other Ambulatory Visit: Payer: No Typology Code available for payment source

## 2020-06-24 DIAGNOSIS — Z20822 Contact with and (suspected) exposure to covid-19: Secondary | ICD-10-CM

## 2020-06-29 LAB — NOVEL CORONAVIRUS, NAA: SARS-CoV-2, NAA: NOT DETECTED

## 2020-06-30 ENCOUNTER — Telehealth: Payer: Self-pay | Admitting: General Practice

## 2020-06-30 NOTE — Telephone Encounter (Signed)
Patient called to get COVID results. Made him aware they were negative. 

## 2020-07-01 MED FILL — ?ATORVASTATIN 40MG TABLET: 40 | 30 days supply | Qty: 30 | Fill #0

## 2020-07-01 MED FILL — ?BASAGLAR 100 UNITS/ML KWPE: 100 | 30 days supply | Qty: 15 | Fill #2

## 2020-07-01 MED FILL — TRUE METRIX GLUCOSE TEST ST: 30 days supply | Qty: 100 | Fill #4

## 2020-07-01 MED FILL — FENOFIBRATE 145 MG TABLET: 145 | 30 days supply | Qty: 30 | Fill #1

## 2020-07-02 MED FILL — GLIMEPIRIDE 4 MG TABS: 4 | 30 days supply | Qty: 30 | Fill #1

## 2020-07-02 MED FILL — ?HUMALOG 100 UNITS/ML KWIKP: 100 | 33 days supply | Qty: 30 | Fill #1

## 2020-07-28 ENCOUNTER — Other Ambulatory Visit: Payer: Self-pay

## 2020-07-28 ENCOUNTER — Ambulatory Visit: Payer: Self-pay | Attending: Nurse Practitioner | Admitting: Nurse Practitioner

## 2020-08-13 MED FILL — TRUE METRIX GLUCOSE TEST ST: 30 days supply | Qty: 100 | Fill #5

## 2020-09-05 MED FILL — TRUE METRIX GLUCOSE TEST ST: 30 days supply | Qty: 100 | Fill #5

## 2020-09-05 MED FILL — GLIMEPIRIDE 4 MG TABLET: 4 | 30 days supply | Qty: 30 | Fill #2

## 2020-09-05 MED FILL — IBUPROFEN 800 MG TABLET: 800 | 20 days supply | Qty: 60 | Fill #1

## 2020-09-05 MED FILL — ?FENOFIBRATE 145 MG TABLET: 145 | 30 days supply | Qty: 30 | Fill #2

## 2020-09-05 MED FILL — ?BASAGLAR 100 UNITS/ML KWPE: 100 | 30 days supply | Qty: 15 | Fill #3

## 2020-09-05 MED FILL — ?ATORVASTATIN 40MG TABLET: 40 | 30 days supply | Qty: 30 | Fill #1

## 2020-09-13 ENCOUNTER — Other Ambulatory Visit: Payer: Self-pay

## 2020-10-03 ENCOUNTER — Other Ambulatory Visit: Payer: Self-pay

## 2020-10-03 MED FILL — Insulin Lispro Soln Pen-injector 100 Unit/ML (1 Unit Dial): SUBCUTANEOUS | 33 days supply | Qty: 30 | Fill #0 | Status: AC

## 2020-11-04 ENCOUNTER — Other Ambulatory Visit: Payer: Self-pay

## 2020-11-04 ENCOUNTER — Other Ambulatory Visit: Payer: Self-pay | Admitting: Nurse Practitioner

## 2020-11-04 DIAGNOSIS — E1165 Type 2 diabetes mellitus with hyperglycemia: Secondary | ICD-10-CM

## 2020-11-04 DIAGNOSIS — IMO0002 Reserved for concepts with insufficient information to code with codable children: Secondary | ICD-10-CM

## 2020-11-04 MED ORDER — TRUEPLUS PEN NEEDLES 31G X 5 MM MISC
5 refills | Status: DC
  Filled 2020-11-04: qty 100, 25d supply, fill #0

## 2020-11-04 MED FILL — Glucose Blood Test Strip: 25 days supply | Qty: 100 | Fill #0 | Status: AC

## 2020-11-04 MED FILL — Insulin Glargine Soln Pen-Injector 100 Unit/ML: SUBCUTANEOUS | 30 days supply | Qty: 15 | Fill #0 | Status: AC

## 2020-11-04 MED FILL — Fenofibrate Tab 145 MG: ORAL | 30 days supply | Qty: 30 | Fill #0 | Status: AC

## 2020-11-04 MED FILL — Sitagliptin-Metformin HCl Tab 50-1000 MG: ORAL | 30 days supply | Qty: 60 | Fill #0 | Status: AC

## 2020-11-04 MED FILL — Atorvastatin Calcium Tab 40 MG (Base Equivalent): ORAL | 30 days supply | Qty: 30 | Fill #0 | Status: AC

## 2020-11-04 NOTE — Telephone Encounter (Signed)
Requested Prescriptions  Pending Prescriptions Disp Refills  . Insulin Pen Needle (TRUEPLUS 5-BEVEL PEN NEEDLES) 31G X 5 MM MISC 100 each 5    Sig: Use as directed to administer insulin     Endocrinology: Diabetes - Testing Supplies Passed - 11/04/2020  4:08 PM      Passed - Valid encounter within last 12 months    Recent Outpatient Visits          6 months ago Uncontrolled type 2 diabetes mellitus with complication, with long-term current use of insulin [facility])   [place] [facility] [place], [place] W, NP   8 months ago Uncontrolled type 2 diabetes mellitus with complication, with long-term current use of insulin [facility])   [place] [facility] [place], Jeannett Senior L, RPH-CPP   9 months ago Uncontrolled type 2 diabetes mellitus with complication, with long-term current use of insulin [facility])   [place] [facility] [place], [place] W, NP   1 year ago Poorly controlled type 2 diabetes mellitus [facility])   [place] [facility] [place], [place] W, NP   1 year ago Uncontrolled type 2 diabetes mellitus with complication, with long-term current use of insulin [facility])   [facility] [place], Cornelius Moras, RPH-CPP

## 2020-11-05 ENCOUNTER — Other Ambulatory Visit: Payer: Self-pay

## 2020-11-07 ENCOUNTER — Other Ambulatory Visit: Payer: Self-pay | Admitting: Nurse Practitioner

## 2020-11-07 ENCOUNTER — Other Ambulatory Visit: Payer: Self-pay

## 2020-11-07 DIAGNOSIS — E1165 Type 2 diabetes mellitus with hyperglycemia: Secondary | ICD-10-CM

## 2020-11-11 ENCOUNTER — Other Ambulatory Visit: Payer: Self-pay

## 2020-11-17 ENCOUNTER — Other Ambulatory Visit: Payer: Self-pay

## 2020-12-08 ENCOUNTER — Other Ambulatory Visit: Payer: Self-pay

## 2020-12-08 MED FILL — Insulin Lispro Soln Pen-injector 100 Unit/ML (1 Unit Dial): SUBCUTANEOUS | 33 days supply | Qty: 30 | Fill #1 | Status: AC

## 2020-12-25 ENCOUNTER — Other Ambulatory Visit: Payer: Self-pay

## 2020-12-25 MED FILL — Glucose Blood Test Strip: 25 days supply | Qty: 100 | Fill #1 | Status: AC

## 2021-01-23 ENCOUNTER — Other Ambulatory Visit: Payer: Self-pay | Admitting: Nurse Practitioner

## 2021-01-23 ENCOUNTER — Other Ambulatory Visit: Payer: Self-pay

## 2021-01-23 DIAGNOSIS — E1165 Type 2 diabetes mellitus with hyperglycemia: Secondary | ICD-10-CM

## 2021-01-23 MED ORDER — JANUMET 50-1000 MG PO TABS
1.0000 | ORAL_TABLET | Freq: Two times a day (BID) | ORAL | 0 refills | Status: DC
Start: 2021-01-23 — End: 2021-01-29
  Filled 2021-01-23 – 2021-01-28 (×2): qty 60, 30d supply, fill #0

## 2021-01-23 MED FILL — Glimepiride Tab 4 MG: ORAL | 30 days supply | Qty: 30 | Fill #0 | Status: AC

## 2021-01-23 MED FILL — Insulin Lispro Soln Pen-injector 100 Unit/ML (1 Unit Dial): SUBCUTANEOUS | 33 days supply | Qty: 30 | Fill #2 | Status: AC

## 2021-01-23 MED FILL — Insulin Glargine Soln Pen-Injector 100 Unit/ML: SUBCUTANEOUS | 30 days supply | Qty: 15 | Fill #1 | Status: AC

## 2021-01-23 MED FILL — Fenofibrate Tab 145 MG: ORAL | 30 days supply | Qty: 30 | Fill #1 | Status: AC

## 2021-01-23 MED FILL — Atorvastatin Calcium Tab 40 MG (Base Equivalent): ORAL | 30 days supply | Qty: 30 | Fill #1 | Status: AC

## 2021-01-23 NOTE — Telephone Encounter (Signed)
Requested medication (s) are due for refill today: no  Requested medication (s) are on the active medication list:  yes   Last refill:  11/04/2020  Future visit scheduled: no  Notes to clinic:  overdue for follow up appt Vm left for patient    Requested Prescriptions  Pending Prescriptions Disp Refills   sitaGLIPtin-metformin (JANUMET) 50-1000 MG tablet 60 tablet 2    Sig: TAKE 1 TABLET BY MOUTH 2 (TWO) TIMES DAILY WITH A MEAL FOR 30 DAYS.     Endocrinology:  Diabetes - Biguanide + DPP-4 Inhibitor Combos Failed - 01/23/2021  2:07 PM      Failed - HBA1C is between 0 and 7.9 and within 180 days    Hemoglobin A1C  Date Value Ref Range Status  04/30/2020 11.5 (A) 4.0 - 5.6 % Final   HbA1c, POC (controlled diabetic range)  Date Value Ref Range Status  03/30/2018 11.3 (A) 0.0 - 7.0 % Final   Hgb A1c MFr Bld  Date Value Ref Range Status  08/20/2019 10.9 (H) 4.8 - 5.6 % Final    Comment:             Prediabetes: 5.7 - 6.4          Diabetes: >6.4          Glycemic control for adults with diabetes: <7.0           Failed - Valid encounter within last 6 months    Recent Outpatient Visits           8 months ago Uncontrolled type 2 diabetes mellitus with complication, with long-term current use of insulin ([place])   [place], [place] W, NP   11 months ago Uncontrolled type 2 diabetes mellitus with complication, with long-term current use of insulin ([place])   [place], Annie Main L, RPH-CPP   12 months ago Uncontrolled type 2 diabetes mellitus with complication, with long-term current use of insulin [facility])   [place], [place] W, NP   1 year ago Poorly controlled type 2 diabetes mellitus [facility])   [place], [place] W, NP   1 year ago Uncontrolled type 2 diabetes mellitus with complication, with long-term current use of insulin ([place])    [place], Jarome Matin, RPH-CPP              Passed - Cr in normal range and within 360 days    Creat  Date Value Ref Range Status  09/29/2015 0.97 0.70 - 1.33 mg/dL Final   Creatinine, Ser  Date Value Ref Range Status  01/29/2020 1.06 0.76 - 1.27 mg/dL Final   Creatinine, POC  Date Value Ref Range Status  10/04/2016 225m mg/dL Final   Creatinine, Urine  Date Value Ref Range Status  09/29/2015 46 20 - 370 mg/dL Final          Passed - eGFR in normal range and within 360 days    GFR calc Af Amer  Date Value Ref Range Status  01/29/2020 90 >59 mL/min/1.73 Final    Comment:    **[facility] currently reports eGFR in compliance with the current**   recommendations of the [employer] [facility] will   update reporting as new guidelines are published from the NKF-ASN   Task force.    GFR calc non Af Amer  Date Value Ref Range Status  01/29/2020 78 >59 mL/min/1.73 Final   GFR  Date Value Ref Range Status  10/10/2013 94.34 >60.00 mL/min Final               

## 2021-01-26 ENCOUNTER — Other Ambulatory Visit: Payer: Self-pay

## 2021-01-28 ENCOUNTER — Other Ambulatory Visit: Payer: Self-pay

## 2021-01-29 ENCOUNTER — Ambulatory Visit: Payer: Self-pay | Attending: Physician Assistant | Admitting: Physician Assistant

## 2021-01-29 ENCOUNTER — Other Ambulatory Visit: Payer: Self-pay

## 2021-01-29 ENCOUNTER — Encounter: Payer: Self-pay | Admitting: Physician Assistant

## 2021-01-29 VITALS — BP 123/65 | HR 97 | Ht 66.0 in | Wt 191.2 lb

## 2021-01-29 DIAGNOSIS — E785 Hyperlipidemia, unspecified: Secondary | ICD-10-CM

## 2021-01-29 DIAGNOSIS — Z9119 Patient's noncompliance with other medical treatment and regimen: Secondary | ICD-10-CM

## 2021-01-29 DIAGNOSIS — M62838 Other muscle spasm: Secondary | ICD-10-CM

## 2021-01-29 DIAGNOSIS — E118 Type 2 diabetes mellitus with unspecified complications: Secondary | ICD-10-CM

## 2021-01-29 DIAGNOSIS — E1165 Type 2 diabetes mellitus with hyperglycemia: Secondary | ICD-10-CM

## 2021-01-29 DIAGNOSIS — Z91199 Patient's noncompliance with other medical treatment and regimen due to unspecified reason: Secondary | ICD-10-CM

## 2021-01-29 DIAGNOSIS — IMO0002 Reserved for concepts with insufficient information to code with codable children: Secondary | ICD-10-CM

## 2021-01-29 DIAGNOSIS — N5089 Other specified disorders of the male genital organs: Secondary | ICD-10-CM

## 2021-01-29 DIAGNOSIS — M5137 Other intervertebral disc degeneration, lumbosacral region: Secondary | ICD-10-CM

## 2021-01-29 DIAGNOSIS — Z794 Long term (current) use of insulin: Secondary | ICD-10-CM

## 2021-01-29 LAB — POCT GLYCOSYLATED HEMOGLOBIN (HGB A1C): HbA1c, POC (controlled diabetic range): 12 % — AB (ref 0.0–7.0)

## 2021-01-29 LAB — GLUCOSE, POCT (MANUAL RESULT ENTRY): POC Glucose: 280 mg/dl — AB (ref 70–99)

## 2021-01-29 MED ORDER — JANUMET 50-1000 MG PO TABS
1.0000 | ORAL_TABLET | Freq: Two times a day (BID) | ORAL | 2 refills | Status: DC
Start: 2021-01-29 — End: 2021-07-03
  Filled 2021-01-29 (×2): qty 60, 30d supply, fill #0
  Filled 2021-04-13: qty 60, 30d supply, fill #1

## 2021-01-29 MED ORDER — GLIMEPIRIDE 4 MG PO TABS
ORAL_TABLET | ORAL | 3 refills | Status: DC
  Filled 2021-01-29: qty 30, fill #0

## 2021-01-29 MED ORDER — METHOCARBAMOL 500 MG PO TABS
500.0000 mg | ORAL_TABLET | Freq: Three times a day (TID) | ORAL | 0 refills | Status: DC | PRN
  Filled 2021-01-29: qty 90, 30d supply, fill #0

## 2021-01-29 MED ORDER — INSULIN GLARGINE 100 UNIT/ML SOLOSTAR PEN
50.0000 [IU] | PEN_INJECTOR | Freq: Every day | SUBCUTANEOUS | 11 refills | Status: DC
  Filled 2021-01-29: qty 15, 30d supply, fill #0
  Filled 2021-06-05: qty 15, 30d supply, fill #1
  Filled 2021-08-12: qty 15, 30d supply, fill #0

## 2021-01-29 MED ORDER — FENOFIBRATE 145 MG PO TABS
ORAL_TABLET | Freq: Every day | ORAL | 1 refills | Status: DC
  Filled 2021-01-29: qty 90, fill #0
  Filled 2021-03-05: qty 30, 30d supply, fill #0
  Filled 2021-04-13: qty 30, 30d supply, fill #1
  Filled 2021-05-15: qty 30, 30d supply, fill #2
  Filled 2021-06-19: qty 30, 30d supply, fill #0
  Filled 2021-07-27: qty 30, 30d supply, fill #1

## 2021-01-29 MED ORDER — PREGABALIN 75 MG PO CAPS
ORAL_CAPSULE | ORAL | 1 refills | Status: DC
  Filled 2021-01-29: qty 60, 30d supply, fill #0

## 2021-01-29 MED ORDER — LISINOPRIL 2.5 MG PO TABS
ORAL_TABLET | Freq: Every day | ORAL | 1 refills | Status: DC
  Filled 2021-01-29: qty 30, 30d supply, fill #0
  Filled 2021-04-13: qty 30, 30d supply, fill #1

## 2021-01-29 MED ORDER — ATORVASTATIN CALCIUM 40 MG PO TABS
ORAL_TABLET | Freq: Every day | ORAL | 2 refills | Status: DC
  Filled 2021-01-29: qty 90, fill #0
  Filled 2021-03-05: qty 30, 30d supply, fill #0
  Filled 2021-04-13: qty 30, 30d supply, fill #1
  Filled 2021-05-15: qty 30, 30d supply, fill #2
  Filled 2021-06-19: qty 30, 30d supply, fill #0
  Filled 2021-07-27: qty 30, 30d supply, fill #1

## 2021-01-29 MED ORDER — INSULIN LISPRO (1 UNIT DIAL) 100 UNIT/ML (KWIKPEN)
PEN_INJECTOR | SUBCUTANEOUS | 3 refills | Status: DC
  Filled 2021-01-29: qty 60, fill #0
  Filled 2021-02-26: qty 30, 34d supply, fill #0
  Filled 2021-05-01: qty 30, 34d supply, fill #1
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
## 2021-01-29 NOTE — Patient Instructions (Signed)
Check blood sugars at least 3 times daily and keep a record of exactly what medications you are taking and how you are taking them.  Bring to visit in 3 weeks

## 2021-01-30 LAB — COMPREHENSIVE METABOLIC PANEL
ALT: 18 IU/L (ref 0–44)
AST: 16 IU/L (ref 0–40)
Albumin/Globulin Ratio: 1.4 (ref 1.2–2.2)
Albumin: 4.1 g/dL (ref 3.8–4.9)
Alkaline Phosphatase: 106 IU/L (ref 44–121)
BUN/Creatinine Ratio: 17 (ref 9–20)
BUN: 22 mg/dL (ref 6–24)
Bilirubin Total: <0.2 mg/dL (ref 0.0–1.2)
CO2: 21 mmol/L (ref 20–29)
Calcium: 9.3 mg/dL (ref 8.7–10.2)
Chloride: 99 mmol/L (ref 96–106)
Creatinine, Ser: 1.31 mg/dL — ABNORMAL HIGH (ref 0.76–1.27)
Globulin, Total: 2.9 g/dL (ref 1.5–4.5)
Glucose: 308 mg/dL — ABNORMAL HIGH (ref 65–99)
Potassium: 4.9 mmol/L (ref 3.5–5.2)
Sodium: 136 mmol/L (ref 134–144)
Total Protein: 7 g/dL (ref 6.0–8.5)
eGFR: 63 mL/min/{1.73_m2} (ref >59)

## 2021-01-30 LAB — LIPID PANEL
Chol/HDL Ratio: 8.2 ratio — ABNORMAL HIGH (ref 0.0–5.0)
Cholesterol, Total: 205 mg/dL — ABNORMAL HIGH (ref 100–199)
HDL: 25 mg/dL — ABNORMAL LOW (ref >39)
LDL Chol Calc (NIH): 78 mg/dL (ref 0–99)
Triglycerides: 640 mg/dL (ref 0–149)
VLDL Cholesterol Cal: 102 mg/dL — ABNORMAL HIGH (ref 5–40)

## 2021-02-20 ENCOUNTER — Other Ambulatory Visit: Payer: Self-pay

## 2021-02-20 ENCOUNTER — Other Ambulatory Visit: Payer: Self-pay | Admitting: Nurse Practitioner

## 2021-02-20 ENCOUNTER — Encounter: Payer: Self-pay | Admitting: Pharmacist

## 2021-02-20 ENCOUNTER — Ambulatory Visit: Payer: Self-pay | Attending: Nurse Practitioner | Admitting: Pharmacist

## 2021-02-20 DIAGNOSIS — E1165 Type 2 diabetes mellitus with hyperglycemia: Secondary | ICD-10-CM

## 2021-02-20 DIAGNOSIS — IMO0002 Reserved for concepts with insufficient information to code with codable children: Secondary | ICD-10-CM

## 2021-02-20 DIAGNOSIS — E118 Type 2 diabetes mellitus with unspecified complications: Secondary | ICD-10-CM

## 2021-02-20 DIAGNOSIS — Z794 Long term (current) use of insulin: Secondary | ICD-10-CM

## 2021-02-20 NOTE — Progress Notes (Signed)
    S:    PCP: Zelda  Patient arrives in good spirits. Presents for diabetes evaluation, education, and management.  Patient was referred and last seen by Primary Care Provider on 01/29/2021. Today, patient reports nonadherence with insulin or checking CBGs at home. His attitude is quite dismissive. He does not seem to understand the seriousness of his disease progression.   Patient reports Diabetes was diagnosed in 1998.   Family/Social History:  FHx: no pertinent positives Tobacco: current every day smoker Alcohol: denies  Insurance coverage/medication affordability: self pay  Denies medication adherence. Current diabetes medications include: Janumet 50-1000 mg BID, glimepiride 4 mg daily before breakfast, Lantus 50 units daily, Humalog 30 units TID - Patient self doses insulin and admits only taking his insulin when his "diabetes is uncontrolled." Current hypertension medications include: lisinopril 2.5 mg daily Current hyperlipidemia medications include: atorvastatin 40 mg daily, fenofibrate 145 mg daily  Patient denies hypoglycemic events.  Patient reported dietary habits: Eats 3 meals/day Diet: 3 meals per day - Breakfast: cinnamon bread, cheese - Lunch: chicken, fish, salad - Dinner: 3am, chicken, steak, burger, rice - Snacks: Snickers every 2 or 3 days - Drinks: water, orange juice   Patient reports nocturia (nighttime urination). Uses restroom 5x at night.  Patient reports neuropathy (nerve pain). Patient reports visual changes. Patient reports self foot exams.    O:   Lab Results  Component Value Date   HGBA1C 12.0 (A) 01/29/2021   There were no vitals filed for this visit.   Lipid Panel     Component Value Date/Time   CHOL 205 (H) 01/29/2021 1625   TRIG 640 (HH) 01/29/2021 1625   HDL 25 (L) 01/29/2021 1625   CHOLHDL 8.2 (H) 01/29/2021 1625   CHOLHDL 6.5 (H) 04/05/2016 1056   VLDL 64 (H) 04/05/2016 1056   LDLCALC 78 01/29/2021 1625   LDLDIRECT 94.7  05/25/2013 1612   Home fasting blood sugars: not checking   Clinical Atherosclerotic Cardiovascular Disease (ASCVD): No  The 10-year ASCVD risk score (Arnett DK, et al., 2019) is: 34.5%   Values used to calculate the score:     Age: 23 years     Sex: Male     Is Non-Hispanic African American: No     Diabetic: Yes     Tobacco smoker: Yes     Systolic Blood Pressure: 123 mmHg     Is BP treated: No     HDL Cholesterol: 25 mg/dL     Total Cholesterol: 205 mg/dL    A/P: Diabetes longstanding currently uncontrolled. Control is suboptimal due to nonadherence with medications. Patient frequently self adjusts insulin and does not adhere to provider instruction. Does not check CBGs at home. He seems quite dismissive concerning the severity of his current health. When we pulled him back, he was on the phone.  -Patient encouraged to take medications as prescribed. No changes.  -Extensively discussed pathophysiology of diabetes, recommended lifestyle interventions, dietary effects on blood sugar control. -Counseled on s/sx of and management of hypoglycemia -Next A1C anticipated 04/2021  Written patient instructions provided.  Total time in face to face counseling 30 minutes.   Follow up with PCP.   Butch Penny, PharmD, Patsy [place], CPP Clinical Pharmacist [facility] [phone]

## 2021-02-21 NOTE — Telephone Encounter (Signed)
Requested medications are due for refill today.  Yes  Requested medications are on the active medications list.  no  Last refill. None  Future visit scheduled.   Yes.  Notes to clinic.  Need an order for just the test strips.

## 2021-02-24 ENCOUNTER — Other Ambulatory Visit: Payer: Self-pay

## 2021-02-24 MED ORDER — TRUE METRIX BLOOD GLUCOSE TEST VI STRP
ORAL_STRIP | 12 refills | Status: DC
  Filled 2021-02-24: qty 100, 25d supply, fill #0

## 2021-02-25 ENCOUNTER — Other Ambulatory Visit: Payer: Self-pay

## 2021-02-26 ENCOUNTER — Other Ambulatory Visit: Payer: Self-pay

## 2021-03-05 ENCOUNTER — Other Ambulatory Visit: Payer: Self-pay

## 2021-03-06 ENCOUNTER — Other Ambulatory Visit: Payer: Self-pay

## 2021-03-30 ENCOUNTER — Other Ambulatory Visit: Payer: Self-pay

## 2021-03-30 ENCOUNTER — Ambulatory Visit (INDEPENDENT_AMBULATORY_CARE_PROVIDER_SITE_OTHER): Payer: No Typology Code available for payment source | Admitting: Urology

## 2021-03-30 ENCOUNTER — Encounter: Payer: Self-pay | Admitting: Urology

## 2021-03-30 VITALS — BP 135/76 | HR 93 | Temp 98.8°F | Wt 193.2 lb

## 2021-03-30 DIAGNOSIS — N401 Enlarged prostate with lower urinary tract symptoms: Secondary | ICD-10-CM

## 2021-03-30 DIAGNOSIS — N200 Calculus of kidney: Secondary | ICD-10-CM

## 2021-03-30 DIAGNOSIS — N4889 Other specified disorders of penis: Secondary | ICD-10-CM

## 2021-03-30 DIAGNOSIS — R3912 Poor urinary stream: Secondary | ICD-10-CM

## 2021-03-30 DIAGNOSIS — N138 Other obstructive and reflux uropathy: Secondary | ICD-10-CM

## 2021-03-30 LAB — URINALYSIS, ROUTINE W REFLEX MICROSCOPIC
Bilirubin, UA: NEGATIVE
Glucose, UA: NEGATIVE
Ketones, UA: NEGATIVE
Leukocytes,UA: NEGATIVE
Nitrite, UA: NEGATIVE
Protein,UA: NEGATIVE
RBC, UA: NEGATIVE
Specific Gravity, UA: 1.015 (ref 1.005–1.030)
Urobilinogen, Ur: 0.2 mg/dL (ref 0.2–1.0)
pH, UA: 7 (ref 5.0–7.5)

## 2021-03-30 MED ORDER — TAMSULOSIN HCL 0.4 MG PO CAPS
0.4000 mg | ORAL_CAPSULE | Freq: Every day | ORAL | 11 refills | Status: DC
  Filled 2021-03-30 – 2021-04-13 (×2): qty 30, 30d supply, fill #0

## 2021-03-30 NOTE — Progress Notes (Signed)
03/30/2021 2:17 PM   [name] 02-02-1961 [id number]  Referring provider: Argentina Donovan, PA-C [place],  [place] [address]  No chief complaint on file.   HPI:  New patient-  1) BPH-patient with CT scan in 2019 and about a 40 g prostate.  Otherwise benign.  He has symptoms of frequency, nocturia, hesitancy and intermittent flow.  He did not fill out AUA symptom score.  His PSA was 2.8 in 2020. No gu meds or surgery. No gross hematuria.    2) kidney stone-patient was CT scan in 2019 and then a 4 mm left lower pole stone noted. Never passed a kidney stone or had surgery for kidney stone. CR 1.31 08/22   He has a history of ED and takes sildenafil.   Today, seen for the above.   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 He is from [place] but lived in [place].    PMH: Past Medical History:  Diagnosis Date   Chronic back pain    Chronic lower back pain    Chronic neck pain    DDD (degenerative disc disease), lumbar    Diabetes mellitus    Genital herpes    Hyperlipidemia     Surgical History: Past Surgical History:  Procedure Laterality Date   SHOULDER SURGERY      Home Medications:  Allergies as of 03/30/2021   No Known Allergies      Medication List        Accurate as of March 30, 2021  2:17 PM. If you have any questions, ask your nurse or doctor.          aspirin EC 81 MG tablet Take 1 tablet (81 mg total) by mouth daily.   atorvastatin 40 MG tablet Commonly known as: LIPITOR TAKE 1 TABLET (40 MG TOTAL) BY MOUTH DAILY.   Basaglar KwikPen 100 UNIT/ML Inject 50 Units into the skin daily at 10 pm.   fenofibrate 145 MG tablet Commonly known as: TRICOR TAKE 1 TABLET (145 MG TOTAL) BY MOUTH DAILY.   glimepiride 4 MG tablet Commonly known as: AMARYL TAKE 1 TABLET (4 MG TOTAL) BY MOUTH DAILY BEFORE BREAKFAST.   HumaLOG KwikPen 100 UNIT/ML KwikPen Generic drug: insulin lispro INJECT 30 UNITS INTO THE SKIN 3 (THREE)  TIMES DAILY.   Janumet 50-1000 MG tablet Generic drug: sitaGLIPtin-metformin Take 1 tablet by mouth 2 (two) times daily with a meal.   lisinopril 2.5 MG tablet Commonly known as: ZESTRIL TAKE 1 TABLET (2.5 MG TOTAL) BY MOUTH DAILY.   methocarbamol 500 MG tablet Commonly known as: Robaxin Take 1 tablet (500 mg total) by mouth every 8 (eight) hours as needed for muscle spasms.   nystatin powder Commonly known as: MYCOSTATIN/NYSTOP APPLY 1 APPLICATION TOPICALLY 2 (TWO) TIMES DAILY.   pregabalin 75 MG capsule Commonly known as: LYRICA TAKE 1 CAPSULE (75 MG TOTAL) BY MOUTH 2 (TWO) TIMES DAILY.   sildenafil 100 MG tablet Commonly known as: Viagra Take 0.5-1 tablets (50-100 mg total) by mouth daily as needed for erectile dysfunction.   True Metrix Blood Glucose Test test strip Generic drug: glucose blood USE AS INSTRUCTED   True Metrix Meter w/Device Kit Use as instructed. Check blood glucose level by fingerstick three  per day.   TRUEplus Lancets 28G Misc 1 each by Does not apply route 3 (three) times daily.   TRUEplus Pen Needles 31G X 5 MM Misc Generic drug: Insulin Pen Needle Use as directed to  administer insulin   Vitamin D (Ergocalciferol) 1.25 MG (50000 UNIT) Caps capsule Commonly known as: DRISDOL Take 1 capsule (50,000 Units total) by mouth every 7 (seven) days.        Allergies: No Known Allergies  Family History: No family history on file.  Social History:  reports that he has been smoking cigarettes. He has been smoking an average of 1 pack per day. He has never used smokeless tobacco. He reports that he does not drink alcohol and does not use drugs.   Physical Exam: There were no vitals taken for this visit.  Constitutional:  Alert and oriented, No acute distress. HEENT: [place] AT, moist mucus membranes.  Trachea midline, no masses. Cardiovascular: No clubbing, cyanosis, or edema. Respiratory: Normal respiratory effort, no increased work of  breathing. GI: Abdomen is soft, nontender, nondistended, no abdominal masses GU: No CVA tenderness Lymph: No inguinal lymphadenopathy. Skin: No rashes, bruises or suspicious lesions. Neurologic: Grossly intact, no focal deficits, moving all 4 extremities. Psychiatric: Normal mood and affect. GU: prostate 40 g, smooth without hard area or nodule  Laboratory Data: Lab Results  Component Value Date   WBC 7.2 05/18/2019   HGB 14.3 05/18/2019   HCT 43.7 05/18/2019   MCV 87 05/18/2019   PLT 241 05/18/2019    Lab Results  Component Value Date   CREATININE 1.31 (H) 01/29/2021    Lab Results  Component Value Date   PSA 0.73 09/15/2007    No results found for: TESTOSTERONE  Lab Results  Component Value Date   HGBA1C 12.0 (A) 01/29/2021    Urinalysis    Component Value Date/Time   COLORURINE YELLOW 10/10/2013 1519   APPEARANCEUR CLEAR 10/10/2013 1519   LABSPEC >=1.030 (A) 10/10/2013 1519   PHURINE 6.0 10/10/2013 1519   GLUCOSEU 500 (A) 10/10/2013 1519   HGBUR NEGATIVE 10/10/2013 1519   BILIRUBINUR negative 02/26/2020 1523   BILIRUBINUR neg 10/04/2016 1544   KETONESUR negative 02/26/2020 1523   KETONESUR NEGATIVE 10/10/2013 1519   PROTEINUR neg 10/04/2016 1544   UROBILINOGEN 0.2 02/26/2020 1523   UROBILINOGEN 0.2 10/10/2013 1519   NITRITE Negative 02/26/2020 1523   NITRITE neg 10/04/2016 1544   NITRITE NEGATIVE 10/10/2013 1519   LEUKOCYTESUR Negative 02/26/2020 1523    Lab Results  Component Value Date   LABMICR 5.6 01/29/2020   MUCUS Presence of (A) 10/10/2013    Pertinent Imaging: 2019 CT   Assessment & Plan:    1. BPH with obstruction/lower urinary tract symptoms PSA was sent. DRE benign. Disc nature r/b of surveillance, alpha blocker or daily pde5i. Start tamsulosin.  - Urinalysis, Routine w reflex microscopic  2. Weak urinary stream As above   3. Kidney stones Check renal US and KUB    No follow-ups on file.  Festus Aloe, MD  [facility]  [address] [place], [place] [address] [phone]

## 2021-03-30 NOTE — Progress Notes (Signed)
Urological Symptom Review  Patient is experiencing the following symptoms: Frequent urination Get up at night to urinate Stream starts and stops Have to strain to urinate   Review of Systems  Gastrointestinal (upper)  : Negative for upper GI symptoms  Gastrointestinal (lower) : Negative for lower GI symptoms  Constitutional : Negative for symptoms  Skin: Negative for skin symptoms  Eyes: Negative for eye symptoms  Ear/Nose/Throat : Negative for Ear/Nose/Throat symptoms  Hematologic/Lymphatic: Negative for Hematologic/Lymphatic symptoms  Cardiovascular : Negative for cardiovascular symptoms  Respiratory : Negative for respiratory symptoms  Endocrine: Negative for endocrine symptoms  Musculoskeletal: Back pain  Neurological: Negative for neurological symptoms  Psychologic: Negative for psychiatric symptoms

## 2021-03-31 LAB — PSA: Prostate Specific Ag, Serum: 3.1 ng/mL (ref 0.0–4.0)

## 2021-04-06 ENCOUNTER — Other Ambulatory Visit: Payer: Self-pay

## 2021-04-13 ENCOUNTER — Other Ambulatory Visit: Payer: Self-pay

## 2021-05-01 ENCOUNTER — Other Ambulatory Visit: Payer: Self-pay

## 2021-05-01 ENCOUNTER — Encounter: Payer: Self-pay | Admitting: Nurse Practitioner

## 2021-05-01 ENCOUNTER — Ambulatory Visit: Payer: No Typology Code available for payment source | Attending: Nurse Practitioner | Admitting: Nurse Practitioner

## 2021-05-01 VITALS — BP 110/61 | HR 88 | Ht 66.0 in | Wt 189.2 lb

## 2021-05-01 DIAGNOSIS — B070 Plantar wart: Secondary | ICD-10-CM

## 2021-05-01 DIAGNOSIS — Z1211 Encounter for screening for malignant neoplasm of colon: Secondary | ICD-10-CM

## 2021-05-01 DIAGNOSIS — E1165 Type 2 diabetes mellitus with hyperglycemia: Secondary | ICD-10-CM

## 2021-05-01 LAB — GLUCOSE, POCT (MANUAL RESULT ENTRY): POC Glucose: 327 mg/dl — AB (ref 70–99)

## 2021-05-01 LAB — POCT GLYCOSYLATED HEMOGLOBIN (HGB A1C): Hemoglobin A1C: 11.3 % — AB (ref 4.0–5.6)

## 2021-05-01 MED ORDER — TRUEPLUS LANCETS 28G MISC
3 refills | Status: DC
  Filled 2021-05-01: qty 100, 33d supply, fill #0

## 2021-05-01 MED ORDER — INSULIN LISPRO (1 UNIT DIAL) 100 UNIT/ML (KWIKPEN)
30.0000 [IU] | PEN_INJECTOR | Freq: Two times a day (BID) | SUBCUTANEOUS | 3 refills | Status: DC
  Filled 2021-05-01: qty 18, 30d supply, fill #0
  Filled 2021-06-05: qty 18, 30d supply, fill #1
  Filled 2021-07-17: qty 18, 30d supply, fill #0
  Filled 2021-08-12: qty 18, 30d supply, fill #1

## 2021-05-01 MED ORDER — TRUE METRIX BLOOD GLUCOSE TEST VI STRP
ORAL_STRIP | 12 refills | Status: DC
  Filled 2021-05-01: qty 100, 33d supply, fill #0

## 2021-05-01 NOTE — Patient Instructions (Addendum)
LUNCH AND DINNER ADMINISTER HUMALOG  LANTUS WITH BREAKFAST and at 9-10pm at night

## 2021-05-01 NOTE — Progress Notes (Signed)
Assessment & Plan:  Christian Navarro was seen today for diabetes.  Diagnoses and all orders for this visit:  Poorly controlled type 2 diabetes mellitus ([place]) -     POCT glucose (manual entry) -     POCT glycosylated hemoglobin (Hb A1C) -     Ambulatory referral to Ophthalmology -     CMP14+EGFR -     insulin lispro (HUMALOG) 100 UNIT/ML KwikPen; Inject 30 Units into the skin 2 (two) times daily. -     glucose blood (TRUE METRIX BLOOD GLUCOSE TEST) test strip; Use as instructed. Check blood glucose level by fingerstick three times per day. -     TRUEplus Lancets 28G MISC; Use as instructed. Check blood glucose level by fingerstick 3 times per day.   E11.65  Colon cancer screening -     Ambulatory referral to Gastroenterology  Plantar wart of left foot -     Ambulatory referral to Podiatry   Patient has been counseled on age-appropriate routine health concerns for screening and prevention. These are reviewed and up-to-date. Referrals have been placed accordingly. Immunizations are up-to-date or declined.    Subjective:   Chief Complaint  Patient presents with   Diabetes   HPI Christian Navarro 60 y.o. male presents to office today for follow up to DM.  He has a past medical history of Chronic back pain, Chronic lower back pain, Chronic neck pain, DDD lumbar, Diabetes mellitus, Genital herpes, and Hyperlipidemia.   DM 2 Poorly controlled. He is not adherent with taking his diabetes medications. States he forgets to take his lantus and sometimes when he adminsters 50 units at night his blood glucose levels drop into the 50-60s. Will try lantus 25 units in the am and 25 units in the pm. Will switch Humalog to 30 units BID for lunch and dinner instead of 3 times per day. If no significant changes with glucose readings may need to switch to 70/30. He will continue janumet 50-1000 mg BID and amaryl 4 mg daily.  Lab Results  Component Value Date   HGBA1C 11.3 (A) 05/01/2021   Lab Results   Component Value Date   LDLCALC 78 01/29/2021    Blood pressure is well controlled. He is on renal dose ACE.  BP Readings from Last 3 Encounters:  05/01/21 110/61  03/30/21 135/76  01/29/21 123/65     Review of Systems  Constitutional:  Negative for fever, malaise/fatigue and weight loss.  HENT: Negative.  Negative for nosebleeds.   Eyes:  Positive for blurred vision. Negative for double vision and photophobia.  Respiratory: Negative.  Negative for cough and shortness of breath.   Cardiovascular: Negative.  Negative for chest pain, palpitations and leg swelling.  Gastrointestinal: Negative.  Negative for heartburn, nausea and vomiting.  Musculoskeletal: Negative.  Negative for myalgias.  Neurological: Negative.  Negative for dizziness, focal weakness, seizures and headaches.  Psychiatric/Behavioral: Negative.  Negative for suicidal ideas.    Past Medical History:  Diagnosis Date   Chronic back pain    Chronic lower back pain    Chronic neck pain    DDD (degenerative disc disease), lumbar    Diabetes mellitus    Genital herpes    Hyperlipidemia     Past Surgical History:  Procedure Laterality Date   SHOULDER SURGERY      History reviewed. No pertinent family history.  Social History Reviewed with no changes to be made today.   Outpatient Medications Prior to Visit  Medication Sig Dispense Refill  aspirin EC 81 MG tablet Take 1 tablet (81 mg total) by mouth daily. 120 tablet 2   atorvastatin (LIPITOR) 40 MG tablet TAKE 1 TABLET (40 MG TOTAL) BY MOUTH DAILY. 90 tablet 2   Blood Glucose Monitoring Suppl (TRUE METRIX METER) w/Device KIT Use as instructed. Check blood glucose level by fingerstick three  per day. 1 kit 0   fenofibrate (TRICOR) 145 MG tablet TAKE 1 TABLET (145 MG TOTAL) BY MOUTH DAILY. 90 tablet 1   glimepiride (AMARYL) 4 MG tablet TAKE 1 TABLET (4 MG TOTAL) BY MOUTH DAILY BEFORE BREAKFAST. 30 tablet 3   glucose blood (TRUE METRIX BLOOD GLUCOSE TEST) test  strip USE AS INSTRUCTED 100 strip 12   insulin glargine (LANTUS) 100 UNIT/ML Solostar Pen Inject 50 Units into the skin daily at 10 pm. 15 mL 11   Insulin Pen Needle (TRUEPLUS PEN NEEDLES) 31G X 5 MM MISC Use as directed to administer insulin 100 each 5   lisinopril (ZESTRIL) 2.5 MG tablet TAKE 1 TABLET (2.5 MG TOTAL) BY MOUTH DAILY. 90 tablet 1   methocarbamol (ROBAXIN) 500 MG tablet Take 1 tablet (500 mg total) by mouth every 8 (eight) hours as needed for muscle spasms. 90 tablet 0   pregabalin (LYRICA) 75 MG capsule TAKE 1 CAPSULE (75 MG TOTAL) BY MOUTH 2 (TWO) TIMES DAILY. 60 capsule 1   sildenafil (VIAGRA) 100 MG tablet Take 0.5-1 tablets (50-100 mg total) by mouth daily as needed for erectile dysfunction. 10 tablet 11   sitaGLIPtin-metformin (JANUMET) 50-1000 MG tablet Take 1 tablet by mouth 2 (two) times daily with a meal. 60 tablet 2   tamsulosin (FLOMAX) 0.4 MG CAPS capsule Take 1 capsule (0.4 mg total) by mouth daily after supper. 30 capsule 11   TRUEplus Lancets 28G MISC 1 each by Does not apply route 3 (three) times daily. 100 each 12   Vitamin D, Ergocalciferol, (DRISDOL) 50000 units CAPS capsule Take 1 capsule (50,000 Units total) by mouth every 7 (seven) days. 12 capsule 0   insulin lispro (HUMALOG) 100 UNIT/ML KwikPen INJECT 30 UNITS INTO THE SKIN 3 (THREE) TIMES DAILY. 60 mL 3   No facility-administered medications prior to visit.    No Known Allergies     Objective:    BP 110/61   Pulse 88   Ht 5' 6"  (1.676 m)   Wt 189 lb 4 oz (85.8 kg)   SpO2 98%   BMI 30.55 kg/m  Wt Readings from Last 3 Encounters:  05/01/21 189 lb 4 oz (85.8 kg)  03/30/21 193 lb 3.2 oz (87.6 kg)  01/29/21 191 lb 3.2 oz (86.7 kg)    Physical Exam Vitals and nursing note reviewed.  Constitutional:      Appearance: He is well-developed.  HENT:     Head: Normocephalic and atraumatic.  Cardiovascular:     Rate and Rhythm: Normal rate and regular rhythm.     Heart sounds: Normal heart sounds.  No murmur heard.   No friction rub. No gallop.  Pulmonary:     Effort: Pulmonary effort is normal. No tachypnea or respiratory distress.     Breath sounds: Normal breath sounds. No decreased breath sounds, wheezing, rhonchi or rales.  Chest:     Chest wall: No tenderness.  Abdominal:     General: Bowel sounds are normal.     Palpations: Abdomen is soft.  Musculoskeletal:        General: Normal range of motion.     Cervical back: Normal range  of motion.       Feet:  Skin:    General: Skin is warm and dry.  Neurological:     Mental Status: He is alert and oriented to person, place, and time.     Coordination: Coordination normal.  Psychiatric:        Behavior: Behavior normal. Behavior is cooperative.        Thought Content: Thought content normal.        Judgment: Judgment normal.         Patient has been counseled extensively about nutrition and exercise as well as the importance of adherence with medications and regular follow-up. The patient was given clear instructions to go to ER or return to medical center if symptoms don't improve, worsen or new problems develop. The patient verbalized understanding.   Follow-up: Return for LUKE 6 weeks meter check. See me in 3 months.   Gildardo Pounds, FNP-BC [facility], [place]   05/01/2021, 4:50 PM

## 2021-05-02 LAB — CMP14+EGFR
ALT: 22 IU/L (ref 0–44)
AST: 19 IU/L (ref 0–40)
Albumin/Globulin Ratio: 1.4 (ref 1.2–2.2)
Albumin: 4.4 g/dL (ref 3.8–4.9)
Alkaline Phosphatase: 87 IU/L (ref 44–121)
BUN/Creatinine Ratio: 19 (ref 9–20)
BUN: 24 mg/dL (ref 6–24)
Bilirubin Total: <0.2 mg/dL (ref 0.0–1.2)
CO2: 23 mmol/L (ref 20–29)
Calcium: 9.5 mg/dL (ref 8.7–10.2)
Chloride: 99 mmol/L (ref 96–106)
Creatinine, Ser: 1.28 mg/dL — ABNORMAL HIGH (ref 0.76–1.27)
Globulin, Total: 3.1 g/dL (ref 1.5–4.5)
Glucose: 328 mg/dL — ABNORMAL HIGH (ref 70–99)
Potassium: 5.3 mmol/L — ABNORMAL HIGH (ref 3.5–5.2)
Sodium: 136 mmol/L (ref 134–144)
Total Protein: 7.5 g/dL (ref 6.0–8.5)
eGFR: 64 mL/min/{1.73_m2} (ref >59)

## 2021-05-15 ENCOUNTER — Other Ambulatory Visit: Payer: Self-pay

## 2021-05-25 ENCOUNTER — Other Ambulatory Visit: Payer: Self-pay

## 2021-05-25 ENCOUNTER — Ambulatory Visit (HOSPITAL_COMMUNITY)
Admission: RE | Admit: 2021-05-25 | Discharge: 2021-05-25 | Disposition: A | Discharge status: Home / Self Care | Payer: Self-pay | Source: Ambulatory Visit | Attending: Urology | Admitting: Urology

## 2021-05-25 DIAGNOSIS — N200 Calculus of kidney: Secondary | ICD-10-CM | POA: Insufficient documentation

## 2021-05-29 ENCOUNTER — Ambulatory Visit: Payer: No Typology Code available for payment source | Admitting: Podiatry

## 2021-05-29 ENCOUNTER — Other Ambulatory Visit: Payer: Self-pay

## 2021-06-01 ENCOUNTER — Telehealth: Payer: Self-pay

## 2021-06-01 ENCOUNTER — Ambulatory Visit: Payer: No Typology Code available for payment source | Admitting: Urology

## 2021-06-01 NOTE — Telephone Encounter (Signed)
Patient could not make the appt for today due to no transportation. He wants to know if his results can be faxed to his primary doctor's office: [facility] [phone] - phone  Please advise.  Call patient back at: [phone]  Thanks, Rosey Bath

## 2021-06-03 NOTE — Telephone Encounter (Signed)
Spoke with patient. Appointment rescheduled. X-ray results sent to PCP per patient's request.

## 2021-06-05 ENCOUNTER — Other Ambulatory Visit: Payer: Self-pay

## 2021-06-17 ENCOUNTER — Ambulatory Visit: Payer: Self-pay | Admitting: Podiatry

## 2021-06-17 ENCOUNTER — Other Ambulatory Visit: Payer: Self-pay

## 2021-06-19 ENCOUNTER — Other Ambulatory Visit (HOSPITAL_COMMUNITY): Payer: Self-pay

## 2021-06-24 ENCOUNTER — Other Ambulatory Visit: Payer: Self-pay

## 2021-06-24 ENCOUNTER — Ambulatory Visit: Payer: Self-pay | Admitting: *Deleted

## 2021-06-24 ENCOUNTER — Other Ambulatory Visit: Payer: Self-pay | Admitting: Physician Assistant

## 2021-06-24 ENCOUNTER — Ambulatory Visit: Payer: Self-pay | Admitting: Podiatry

## 2021-06-24 DIAGNOSIS — Z794 Long term (current) use of insulin: Secondary | ICD-10-CM

## 2021-06-24 DIAGNOSIS — E1165 Type 2 diabetes mellitus with hyperglycemia: Secondary | ICD-10-CM

## 2021-06-24 MED ORDER — LISINOPRIL 2.5 MG PO TABS
ORAL_TABLET | Freq: Every day | ORAL | 1 refills | Status: DC
  Filled 2021-06-24: qty 30, 30d supply, fill #0

## 2021-06-24 NOTE — Progress Notes (Signed)
Courtesy refill  Riddik Senna S. Mayers, PA-C Physician Assistant [facility] [email or web address]  

## 2021-06-24 NOTE — Progress Notes (Signed)
Patient is aware of refill being sent and was provided reminders for upcoming appointments with CP and PCP.

## 2021-06-29 ENCOUNTER — Ambulatory Visit (INDEPENDENT_AMBULATORY_CARE_PROVIDER_SITE_OTHER): Payer: Self-pay | Admitting: Urology

## 2021-06-29 ENCOUNTER — Encounter: Payer: Self-pay | Admitting: Urology

## 2021-06-29 ENCOUNTER — Other Ambulatory Visit: Payer: Self-pay

## 2021-06-29 VITALS — BP 147/74 | HR 99 | Wt 180.0 lb

## 2021-06-29 DIAGNOSIS — N138 Other obstructive and reflux uropathy: Secondary | ICD-10-CM

## 2021-06-29 DIAGNOSIS — R3912 Poor urinary stream: Secondary | ICD-10-CM

## 2021-06-29 DIAGNOSIS — N200 Calculus of kidney: Secondary | ICD-10-CM

## 2021-06-29 DIAGNOSIS — N401 Enlarged prostate with lower urinary tract symptoms: Secondary | ICD-10-CM

## 2021-06-29 LAB — URINALYSIS, ROUTINE W REFLEX MICROSCOPIC
Bilirubin, UA: NEGATIVE
Ketones, UA: NEGATIVE
Leukocytes,UA: NEGATIVE
Nitrite, UA: NEGATIVE
Protein,UA: NEGATIVE
RBC, UA: NEGATIVE
Specific Gravity, UA: 1.015 (ref 1.005–1.030)
Urobilinogen, Ur: 0.2 mg/dL (ref 0.2–1.0)
pH, UA: 5 (ref 5.0–7.5)

## 2021-06-29 LAB — BLADDER SCAN AMB NON-IMAGING

## 2021-06-29 NOTE — Progress Notes (Signed)

## 2021-06-29 NOTE — Progress Notes (Signed)
06/29/2021 10:15 AM   Christian Navarro Christian Navarro 04-07-1961 [id number]  Referring provider: Gildardo Pounds, NP [place],  [place] [address]  No chief complaint on file.   HPI:  1) BPH-patient with CT scan in 2019 and about a 40 g prostate and prostate was 51 g on [place] 2022. Otherwise imaging benign.  He has symptoms of frequency, nocturia, hesitancy and intermittent flow.  He did not fill out AUA symptom score.  His PSA was 2.8 in 2020. No gu meds or surgery. No gross hematuria.   He was rx'd tamsulosin Oct 2022 but did not start. PSA was 10/22 3.1. PVR 0 ml today, 01/23.      2) kidney stone-patient was CT scan in 2019 and then a 4 mm left lower pole stone noted. Never passed a kidney stone or had surgery for kidney stone. CR 1.31 08/22  Follow-up ultrasound and KUB December 2022 revealed a 4 mm left lower pole stone, he also had a 3.1 cm right upper pole cyst, 5.7 cm left upper pole cyst.  Stable compared back to prior CT.      He has a history of ED and takes sildenafil.     Today, seen for the above.   PMH: Past Medical History:  Diagnosis Date   Chronic back pain    Chronic lower back pain    Chronic neck pain    DDD (degenerative disc disease), lumbar    Diabetes mellitus    Genital herpes    Hyperlipidemia     Surgical History: Past Surgical History:  Procedure Laterality Date   SHOULDER SURGERY      Home Medications:  Allergies as of 06/29/2021   No Known Allergies      Medication List        Accurate as of June 29, 2021 10:15 AM. If you have any questions, ask your nurse or doctor.          aspirin EC 81 MG tablet Take 1 tablet (81 mg total) by mouth daily.   atorvastatin 40 MG tablet Commonly known as: LIPITOR TAKE 1 TABLET (40 MG TOTAL) BY MOUTH DAILY.   Basaglar KwikPen 100 UNIT/ML Inject 50 Units into the skin daily at 10 pm.   fenofibrate 145 MG tablet Commonly known as: TRICOR TAKE 1 TABLET (145 MG TOTAL) BY MOUTH  DAILY.   glimepiride 4 MG tablet Commonly known as: AMARYL TAKE 1 TABLET (4 MG TOTAL) BY MOUTH DAILY BEFORE BREAKFAST.   HumaLOG KwikPen 100 UNIT/ML KwikPen Generic drug: insulin lispro Inject 30 Units into the skin 2 (two) times daily.   Janumet 50-1000 MG tablet Generic drug: sitaGLIPtin-metformin Take 1 tablet by mouth 2 (two) times daily with a meal.   lisinopril 2.5 MG tablet Commonly known as: ZESTRIL TAKE 1 TABLET (2.5 MG TOTAL) BY MOUTH DAILY.   methocarbamol 500 MG tablet Commonly known as: Robaxin Take 1 tablet (500 mg total) by mouth every 8 (eight) hours as needed for muscle spasms.   pregabalin 75 MG capsule Commonly known as: LYRICA TAKE 1 CAPSULE (75 MG TOTAL) BY MOUTH 2 (TWO) TIMES DAILY.   sildenafil 100 MG tablet Commonly known as: Viagra Take 0.5-1 tablets (50-100 mg total) by mouth daily as needed for erectile dysfunction.   tamsulosin 0.4 MG Caps capsule Commonly known as: FLOMAX Take 1 capsule (0.4 mg total) by mouth daily after supper.   True Metrix Blood Glucose Test test strip Generic drug: glucose blood USE AS INSTRUCTED  True Metrix Blood Glucose Test test strip Generic drug: glucose blood Use as instructed. Check blood glucose level by fingerstick three times per day.   True Metrix Meter w/Device Kit Use as instructed. Check blood glucose level by fingerstick three  per day.   TRUEplus Lancets 28G Misc 1 each by Does not apply route 3 (three) times daily.   TRUEplus Lancets 28G Misc Use as instructed. Check blood glucose level by fingerstick 3 times per day.   E11.65   TRUEplus Pen Needles 31G X 5 MM Misc Generic drug: Insulin Pen Needle Use as directed to administer insulin   Vitamin D (Ergocalciferol) 1.25 MG (50000 UNIT) Caps capsule Commonly known as: DRISDOL Take 1 capsule (50,000 Units total) by mouth every 7 (seven) days.        Allergies: No Known Allergies  Family History: No family history on file.  Social  History:  reports that he has been smoking cigarettes. He has been smoking an average of 1 pack per day. He has never used smokeless tobacco. He reports that he does not drink alcohol and does not use drugs.   Physical Exam: BP (!) 147/74    Pulse 99    Wt 180 lb (81.6 kg)    BMI 29.05 kg/m   Constitutional:  Alert and oriented, No acute distress. HEENT: [place] AT, moist mucus membranes.  Trachea midline, no masses. Cardiovascular: No clubbing, cyanosis, or edema. Respiratory: Normal respiratory effort, no increased work of breathing. GI: Abdomen is soft, nontender, nondistended, no abdominal masses GU: No CVA tenderness Skin: No rashes, bruises or suspicious lesions. Neurologic: Grossly intact, no focal deficits, moving all 4 extremities. Psychiatric: Normal mood and affect.  Laboratory Data: Lab Results  Component Value Date   WBC 7.2 05/18/2019   HGB 14.3 05/18/2019   HCT 43.7 05/18/2019   MCV 87 05/18/2019   PLT 241 05/18/2019    Lab Results  Component Value Date   CREATININE 1.28 (H) 05/01/2021    Lab Results  Component Value Date   PSA 0.73 09/15/2007    No results found for: TESTOSTERONE  Lab Results  Component Value Date   HGBA1C 11.3 (A) 05/01/2021    Urinalysis    Component Value Date/Time   COLORURINE YELLOW 10/10/2013 1519   APPEARANCEUR Clear 03/30/2021 1437   LABSPEC >=1.030 (A) 10/10/2013 1519   PHURINE 6.0 10/10/2013 1519   GLUCOSEU Negative 03/30/2021 1437   GLUCOSEU 500 (A) 10/10/2013 1519   HGBUR NEGATIVE 10/10/2013 1519   BILIRUBINUR Negative 03/30/2021 1437   KETONESUR negative 02/26/2020 1523   KETONESUR NEGATIVE 10/10/2013 1519   PROTEINUR Negative 03/30/2021 1437   UROBILINOGEN 0.2 02/26/2020 1523   UROBILINOGEN 0.2 10/10/2013 1519   NITRITE Negative 03/30/2021 1437   NITRITE NEGATIVE 10/10/2013 1519   LEUKOCYTESUR Negative 03/30/2021 1437    Lab Results  Component Value Date   LABMICR Comment 03/30/2021   MUCUS Presence of (A)  10/10/2013    Pertinent Imaging: I reviewed the KUB and ultrasound images from 2022 and the CT from 2019 Results for orders placed during the hospital encounter of 05/25/21  DG Abd 1 View  Narrative CLINICAL DATA:  Nephrolithiasis  EXAM: ABDOMEN - 1 VIEW  COMPARISON:  CT abdomen pelvis 05/15/2018  FINDINGS: No dilutes of bowel to indicate ileus or obstruction. 4 mm nonobstructing calculus seen in the lower pole of the left kidney. No acute osseous abnormality identified.  IMPRESSION: 4 mm nonobstructing calculus in the lower pole of the left kidney.  Electronically Signed By: Miachel Roux M.D. On: 05/27/2021 08:33  No results found for this or any previous visit.  No results found for this or any previous visit.  No results found for this or any previous visit.  Results for orders placed during the hospital encounter of 05/25/21  US RENAL  Narrative CLINICAL DATA:  Nephrolithiasis  EXAM: RENAL / URINARY TRACT ULTRASOUND COMPLETE  COMPARISON:  CT abdomen pelvis 05/15/2018  FINDINGS: Right Kidney:  Renal measurements: 11.6 x 5.9 x 6.0 cm = volume: 215 mL. Echogenicity within normal limits. No mass or hydronephrosis visualized. 3.1 cm simple cyst seen in the upper pole.  Left Kidney:  Renal measurements: 10.9 x 5.3 x 5.0 cm = volume: 138 mL. Echogenicity within normal limits. No mass or hydronephrosis visualized. 5.7 cm simple cyst seen in the upper pole of the left kidney. 4 mm echogenic structure in the lower pole the left kidney is suspicious for a nonobstructing calculus.  Bladder:  Diffuse bladder wall thickening likely due to under distension. Chronic cystitis and outlet obstruction can have a similar appearance.  Other:  Prostate is heterogeneous and enlarged measuring up to 51 mL.  IMPRESSION: 1. No hydronephrosis. 2. 4 mm nonobstructing calculus present in the lower pole of the left kidney.   Electronically Signed By: Miachel Roux  M.D. On: 05/27/2021 08:32  No results found for this or any previous visit.  No results found for this or any previous visit.  No results found for this or any previous visit.   Assessment & Plan:    1. Weak urinary stream Disc again nature r/b/a to alpha blockers and he'll consider  - Urinalysis, Routine w reflex microscopic - BLADDER SCAN AMB NON-IMAGING  2. BPH with obstruction/lower urinary tract symptoms  - Urinalysis, Routine w reflex microscopic - BLADDER SCAN AMB NON-IMAGING  3. Kidney stone - discussed the [place] and KUB findings - nature r/b of surv, URS or ESWL. Cont surveillance.   No follow-ups on file.  Festus Aloe, MD  [facility]  [address] [place], [place] [address] [phone]

## 2021-07-01 ENCOUNTER — Other Ambulatory Visit: Payer: Self-pay

## 2021-07-01 ENCOUNTER — Ambulatory Visit (INDEPENDENT_AMBULATORY_CARE_PROVIDER_SITE_OTHER): Payer: 59 | Admitting: Podiatry

## 2021-07-01 DIAGNOSIS — Q828 Other specified congenital malformations of skin: Secondary | ICD-10-CM

## 2021-07-01 DIAGNOSIS — E1165 Type 2 diabetes mellitus with hyperglycemia: Secondary | ICD-10-CM

## 2021-07-01 NOTE — Progress Notes (Signed)
Subjective:  Patient ID: Christian Navarro, male    DOB: 09/10/1960,  MRN: [id number]  Chief Complaint  Patient presents with   Plantar Warts    Possible plantar wart left foot     61 y.o. male presents with the above complaint.  Patient presents with complaint of left submetatarsal 5 as well as lateral foot porokeratosis.  Patient states painful to touch painful to walk on has progressed gotten worse.  He has not seen anyone else prior to seeing me.  Uses cream but does not know if it helps.  He has not seen anyone else prior to seeing me.  He denies any other acute complaints.  He is a diabetic with last A1c of 11.3.   Review of Systems: Negative except as noted in the HPI. Denies N/V/F/Ch.  Past Medical History:  Diagnosis Date   Chronic back pain    Chronic lower back pain    Chronic neck pain    DDD (degenerative disc disease), lumbar    Diabetes mellitus    Genital herpes    Hyperlipidemia     Current Outpatient Medications:    aspirin EC 81 MG tablet, Take 1 tablet (81 mg total) by mouth daily., Disp: 120 tablet, Rfl: 2   atorvastatin (LIPITOR) 40 MG tablet, TAKE 1 TABLET (40 MG TOTAL) BY MOUTH DAILY., Disp: 90 tablet, Rfl: 2   Blood Glucose Monitoring Suppl (TRUE METRIX METER) w/Device KIT, Use as instructed. Check blood glucose level by fingerstick three  per day., Disp: 1 kit, Rfl: 0   fenofibrate (TRICOR) 145 MG tablet, TAKE 1 TABLET (145 MG TOTAL) BY MOUTH DAILY., Disp: 90 tablet, Rfl: 1   glimepiride (AMARYL) 4 MG tablet, TAKE 1 TABLET (4 MG TOTAL) BY MOUTH DAILY BEFORE BREAKFAST., Disp: 30 tablet, Rfl: 3   glucose blood (TRUE METRIX BLOOD GLUCOSE TEST) test strip, USE AS INSTRUCTED, Disp: 100 strip, Rfl: 12   glucose blood (TRUE METRIX BLOOD GLUCOSE TEST) test strip, Use as instructed. Check blood glucose level by fingerstick three times per day., Disp: 100 each, Rfl: 12   insulin glargine (LANTUS) 100 UNIT/ML Solostar Pen, Inject 50 Units into the skin daily at 10 pm.,  Disp: 15 mL, Rfl: 11   insulin lispro (HUMALOG) 100 UNIT/ML KwikPen, Inject 30 Units into the skin 2 (two) times daily., Disp: 54 mL, Rfl: 3   Insulin Pen Needle (TRUEPLUS PEN NEEDLES) 31G X 5 MM MISC, Use as directed to administer insulin, Disp: 100 each, Rfl: 5   lisinopril (ZESTRIL) 2.5 MG tablet, TAKE 1 TABLET (2.5 MG TOTAL) BY MOUTH DAILY., Disp: 90 tablet, Rfl: 1   methocarbamol (ROBAXIN) 500 MG tablet, Take 1 tablet (500 mg total) by mouth every 8 (eight) hours as needed for muscle spasms., Disp: 90 tablet, Rfl: 0   pregabalin (LYRICA) 75 MG capsule, TAKE 1 CAPSULE (75 MG TOTAL) BY MOUTH 2 (TWO) TIMES DAILY., Disp: 60 capsule, Rfl: 1   sildenafil (VIAGRA) 100 MG tablet, Take 0.5-1 tablets (50-100 mg total) by mouth daily as needed for erectile dysfunction., Disp: 10 tablet, Rfl: 11   sitaGLIPtin-metformin (JANUMET) 50-1000 MG tablet, Take 1 tablet by mouth 2 (two) times daily with a meal., Disp: 60 tablet, Rfl: 2   tamsulosin (FLOMAX) 0.4 MG CAPS capsule, Take 1 capsule (0.4 mg total) by mouth daily after supper., Disp: 30 capsule, Rfl: 11   TRUEplus Lancets 28G MISC, 1 each by Does not apply route 3 (three) times daily., Disp: 100 each, Rfl: 12  TRUEplus Lancets 28G MISC, Use as instructed. Check blood glucose level by fingerstick 3 times per day.   E11.65, Disp: 100 each, Rfl: 3   Vitamin D, Ergocalciferol, (DRISDOL) 50000 units CAPS capsule, Take 1 capsule (50,000 Units total) by mouth every 7 (seven) days., Disp: 12 capsule, Rfl: 0  Social History   Tobacco Use  Smoking Status Every Day   Packs/day: 1.00   Types: Cigarettes  Smokeless Tobacco Never    No Known Allergies Objective:  There were no vitals filed for this visit. There is no height or weight on file to calculate BMI. Constitutional Well developed. Well nourished.  Vascular Dorsalis pedis pulses palpable bilaterally. Posterior tibial pulses palpable bilaterally. Capillary refill normal to all digits.  No cyanosis  or clubbing noted. Pedal hair growth normal.  Neurologic Normal speech. Oriented to person, place, and time. Epicritic sensation to light touch grossly present bilaterally.  Dermatologic Hyperkeratotic lesion noted to left submetatarsal 5/3.  Pain on palpation to the lesion.  Central nucleated core noted.  No pinpoint bleeding noted upon debridement.  Orthopedic: Normal joint ROM without pain or crepitus bilaterally. No visible deformities. No bony tenderness.   Radiographs: None Assessment:   1. Uncontrolled type 2 diabetes mellitus with hyperglycemia ([place])   2. Porokeratosis    Plan:  Patient was evaluated and treated and all questions answered.  Left submetatarsal 5/and lateral foot porokeratosis -I explained to the patient the etiology of porokeratosis and various treatment options were extensively discussed.  Given that he is a diabetic with uncontrolled sugars he is a high risk of developing ulceration. -I debrided the lesion down as a courtesy to healthy striated tissue.  No complication noted no pinpoint bleeding noted.  The lesion was excised out to give him some relief. -If there is no improvement we will need to discuss more invasive procedures to get rid of the calluses.  Once again he is a diabetic with uncontrolled A1c he is a high risk of developing ulceration and foot amputations.  No follow-ups on file.

## 2021-07-03 ENCOUNTER — Other Ambulatory Visit: Payer: Self-pay

## 2021-07-03 ENCOUNTER — Ambulatory Visit: Payer: Self-pay | Attending: Nurse Practitioner | Admitting: Pharmacist

## 2021-07-03 DIAGNOSIS — E1165 Type 2 diabetes mellitus with hyperglycemia: Secondary | ICD-10-CM

## 2021-07-03 MED ORDER — TRULICITY 0.75 MG/0.5ML ~~LOC~~ SOAJ
0.7500 mg | SUBCUTANEOUS | 1 refills | Status: DC
  Filled 2021-07-03: qty 2, 28d supply, fill #0

## 2021-07-03 NOTE — Progress Notes (Signed)
° ° °  S:    PCP: Zelda  Patient arrives in good spirits. Presents for diabetes evaluation, education, and management.  Patient was referred and last seen by Primary Care Provider on 05/01/21. I've seen this patient in the past and he has issues with medication non-adherence. Today, he tells me he is not taking glimepiride and does not take Janumet consistently. He will often times omit or modify his Humalog dose as summarized below. He does try to inject Lantus every day.   Patient reports Diabetes was diagnosed in 1998.   Family/Social History:  FHx: no pertinent positives Tobacco: current every day smoker Alcohol: denies  Insurance coverage/medication affordability: self pay  Denies medication adherence. Current diabetes medications include: Janumet 50-1000 mg BID (often forgets his dose), glimepiride 4 mg daily before breakfast (not taking), Lantus 50 units daily, Humalog 30 units TID (takes 15-35 units based on his blood sugar. Tells me he skips his dose if his blood sugar is <150.)  Patient denies hypoglycemic events.  Patient reported dietary habits: Eats 3 meals/day Diet: 3 meals per day - Breakfast: cinnamon bread, cheese - Lunch: chicken, fish, salad - Dinner: 3am, chicken, steak, burger, rice - Snacks: Snickers every 2 or 3 days - Drinks: water, orange juice   Patient reports nocturia (nighttime urination). Uses restroom 5x at night.  Patient reports neuropathy (nerve pain). Patient reports visual changes. Patient reports self foot exams.    O:   Lab Results  Component Value Date   HGBA1C 11.3 (A) 05/01/2021   There were no vitals filed for this visit.  Lipid Panel     Component Value Date/Time   CHOL 205 (H) 01/29/2021 1625   TRIG 640 (HH) 01/29/2021 1625   HDL 25 (L) 01/29/2021 1625   CHOLHDL 8.2 (H) 01/29/2021 1625   CHOLHDL 6.5 (H) 04/05/2016 1056   VLDL 64 (H) 04/05/2016 1056   LDLCALC 78 01/29/2021 1625   LDLDIRECT 94.7 05/25/2013 1612   Home  fasting blood sugars: not checking   Clinical Atherosclerotic Cardiovascular Disease (ASCVD): No  The 10-year ASCVD risk score (Arnett DK, et al., 2019) is: 45.1%   Values used to calculate the score:     Age: 61 years     Sex: Male     Is Non-Hispanic African American: No     Diabetic: Yes     Tobacco smoker: Yes     Systolic Blood Pressure: 147 mmHg     Is BP treated: No     HDL Cholesterol: 25 mg/dL     Total Cholesterol: 205 mg/dL    A/P: Diabetes longstanding currently uncontrolled. Control is suboptimal due to nonadherence with medications. Patient frequently self adjusts insulin and does not adhere to provider instruction. Does not check CBGs at home. I will d/c glimepiride as he is not taking this. Janumet d/c'd per patient request. He is amenable to start Trulicity and trying to take his insulin consistently as prescribed.  -D/c Glimepiride.  -D/c Janumet.  -Continue Humalog, Lantus at current doses.  -Start Trulicity 0.75 mg weekly.  -Extensively discussed pathophysiology of diabetes, recommended lifestyle interventions, dietary effects on blood sugar control. -Counseled on s/sx of and management of hypoglycemia -Next A1C anticipated 04/2021  Written patient instructions provided.  Total time in face to face counseling 30 minutes.   Follow up with me in 1 month.  Butch Penny, PharmD, Patsy Baltimore, CPP Clinical Pharmacist [facility] [phone]

## 2021-07-09 ENCOUNTER — Encounter: Payer: Self-pay | Admitting: Nurse Practitioner

## 2021-07-17 ENCOUNTER — Other Ambulatory Visit: Payer: Self-pay

## 2021-07-27 ENCOUNTER — Other Ambulatory Visit: Payer: Self-pay

## 2021-08-04 ENCOUNTER — Ambulatory Visit: Payer: Self-pay | Admitting: Pharmacist

## 2021-08-12 ENCOUNTER — Other Ambulatory Visit: Payer: Self-pay

## 2021-08-24 ENCOUNTER — Ambulatory Visit: Payer: Self-pay | Admitting: Nurse Practitioner

## 2021-08-25 ENCOUNTER — Encounter: Payer: Self-pay | Admitting: Nurse Practitioner

## 2021-08-25 ENCOUNTER — Other Ambulatory Visit: Payer: Self-pay

## 2021-08-25 ENCOUNTER — Ambulatory Visit: Payer: Self-pay | Attending: Nurse Practitioner | Admitting: Nurse Practitioner

## 2021-08-25 DIAGNOSIS — E1165 Type 2 diabetes mellitus with hyperglycemia: Secondary | ICD-10-CM

## 2021-08-25 NOTE — Progress Notes (Signed)
Virtual Visit via Telephone Note ?Due to national recommendations of social distancing due to COVID 19, telehealth visit is felt to be most appropriate for this patient at this time.  I discussed the limitations, risks, security and privacy concerns of performing an evaluation and management service by telephone and the availability of in person appointments. I also discussed with the patient that there may be a patient responsible charge related to this service. The patient expressed understanding and agreed to proceed.  ? ? ?I connected with Christian Navarro on 08/25/21  at   3:30 PM EDT  EDT by telephone and verified that I am speaking with the correct person using two identifiers. ? ?Location of Patient: ?Private Residence ?  ?Location of Provider: ?[employer]  ?  ?Persons participating in Telemedicine visit: ?Bertram Denver FNP-BC ?Christian Navarro  ?  ?History of Present Illness: ?Telemedicine visit for: DM  ? ?He has a past medical history of Chronic back pain, Chronic lower back pain, Chronic neck pain, DDD lumbar, Diabetes mellitus, Genital herpes, and Hyperlipidemia.  ?  ?DM 2 ?Poorly controlled. He is not adherent with taking his diabetes medications. States he forgets to take his lantus  25 units in the am and 25 units in the pm and Humalog to 30 units BID. Notes blood glucose levels postprandial 200s. Based on next A1C we may need to switch to 70/30. He continues on janumet 50-1000 mg BID and amaryl 4 mg daily. Prescribed renal dose ACE. ?Lab Results  ?Component Value Date  ? HGBA1C 11.3 (A) 05/01/2021  ?  ? ?Past Medical History:  ?Diagnosis Date  ? Chronic back pain   ? Chronic lower back pain   ? Chronic neck pain   ? DDD (degenerative disc disease), lumbar   ? Diabetes mellitus   ? Genital herpes   ? Hyperlipidemia   ?  ?Past Surgical History:  ?Procedure Laterality Date  ? SHOULDER SURGERY    ?  ?History reviewed. No pertinent family history.  ?Social History   ? ?Socioeconomic History  ? Marital status: Married  ?  Spouse name: Not on file  ? Number of children: Not on file  ? Years of education: Not on file  ? Highest education level: Not on file  ?Occupational History  ? Not on file  ?Tobacco Use  ? Smoking status: Every Day  ?  Packs/day: 1.00  ?  Types: Cigarettes  ? Smokeless tobacco: Never  ?Vaping Use  ? Vaping Use: Former  ?Substance and Sexual Activity  ? Alcohol use: No  ? Drug use: No  ? Sexual activity: Yes  ?Other Topics Concern  ? Not on file  ?Social History Narrative  ? Not on file  ? ?Social Determinants of Health  ? ?Financial Resource Strain: Not on file  ?Food Insecurity: Not on file  ?Transportation Needs: Not on file  ?Physical Activity: Not on file  ?Stress: Not on file  ?Social Connections: Not on file  ?  ? ?Observations/Objective: ?Awake, alert and oriented x 3 ? ? ?Review of Systems  ?Constitutional:  Negative for fever, malaise/fatigue and weight loss.  ?HENT: Negative.  Negative for nosebleeds.   ?Eyes: Negative.  Negative for blurred vision, double vision and photophobia.  ?Respiratory: Negative.  Negative for cough and shortness of breath.   ?Cardiovascular: Negative.  Negative for chest pain, palpitations and leg swelling.  ?Gastrointestinal: Negative.  Negative for heartburn, nausea and vomiting.  ?Musculoskeletal: Negative.  Negative for myalgias.  ?  Neurological: Negative.  Negative for dizziness, focal weakness, seizures and headaches.  ?Psychiatric/Behavioral: Negative.  Negative for suicidal ideas.    ?Assessment and Plan: ?Diagnoses and all orders for this visit: ? ?Poorly controlled type 2 diabetes mellitus (HCC) ?Continue blood sugar control as discussed in office today, low carbohydrate diet, and regular physical exercise as tolerated, 150 minutes per week (30 min each day, 5 days per week, or 50 min 3 days per week). Keep blood sugar logs with fasting goal of 90-130 mg/dl, post prandial (after you eat) less than 180.  ?For  Hypoglycemia: BS <60 and Hyperglycemia BS >400; contact the clinic ASAP. ?Annual eye exams and foot exams are recommended.  ? ?Follow Up Instructions ?Return in about 3 months (around 11/25/2021).  ? ?  ?I discussed the assessment and treatment plan with the patient. The patient was provided an opportunity to ask questions and all were answered. The patient agreed with the plan and demonstrated an understanding of the instructions. ?  ?The patient was advised to call back or seek an in-person evaluation if the symptoms worsen or if the condition fails to improve as anticipated. ? ?I provided 11 minutes of non-face-to-face time during this encounter including median intraservice time, reviewing previous notes, labs, imaging, medications and explaining diagnosis and management. ? ?Claiborne Rigg, FNP-BC  ?

## 2021-08-26 ENCOUNTER — Other Ambulatory Visit: Payer: Self-pay

## 2021-08-26 ENCOUNTER — Encounter: Payer: Self-pay | Admitting: Nurse Practitioner

## 2021-08-26 ENCOUNTER — Ambulatory Visit: Payer: Self-pay | Attending: Nurse Practitioner | Admitting: Nurse Practitioner

## 2021-08-26 VITALS — BP 138/67 | HR 93 | Resp 16

## 2021-08-26 DIAGNOSIS — Z1211 Encounter for screening for malignant neoplasm of colon: Secondary | ICD-10-CM

## 2021-08-26 DIAGNOSIS — E785 Hyperlipidemia, unspecified: Secondary | ICD-10-CM

## 2021-08-26 DIAGNOSIS — N138 Other obstructive and reflux uropathy: Secondary | ICD-10-CM

## 2021-08-26 DIAGNOSIS — D649 Anemia, unspecified: Secondary | ICD-10-CM

## 2021-08-26 DIAGNOSIS — E1165 Type 2 diabetes mellitus with hyperglycemia: Secondary | ICD-10-CM

## 2021-08-26 LAB — POCT GLYCOSYLATED HEMOGLOBIN (HGB A1C): HbA1c, POC (controlled diabetic range): 11.3 % — AB (ref 0.0–7.0)

## 2021-08-26 LAB — GLUCOSE, POCT (MANUAL RESULT ENTRY): POC Glucose: 376 mg/dl — AB (ref 70–99)

## 2021-08-26 MED ORDER — ATORVASTATIN CALCIUM 40 MG PO TABS
ORAL_TABLET | Freq: Every day | ORAL | 2 refills | Status: DC
  Filled 2021-08-26: qty 30, 30d supply, fill #0

## 2021-08-26 MED ORDER — FENOFIBRATE 145 MG PO TABS
145.0000 mg | ORAL_TABLET | Freq: Every day | ORAL | 1 refills | Status: DC
  Filled 2021-08-26: qty 30, 30d supply, fill #0

## 2021-08-26 MED ORDER — LISINOPRIL 2.5 MG PO TABS
ORAL_TABLET | Freq: Every day | ORAL | 1 refills | Status: DC
Start: 2021-08-26 — End: 2023-03-08
  Filled 2021-08-26: qty 30, 30d supply, fill #0
  Filled 2022-05-24 – 2022-06-04 (×2): qty 30, 30d supply, fill #1

## 2021-08-26 MED ORDER — TAMSULOSIN HCL 0.4 MG PO CAPS
0.4000 mg | ORAL_CAPSULE | Freq: Every day | ORAL | 11 refills | Status: DC
  Filled 2021-08-26: qty 30, 30d supply, fill #0

## 2021-08-26 NOTE — Progress Notes (Signed)
Assessment & Plan:  Truxton was seen today for diabetes and hypertension.  Diagnoses and all orders for this visit:  Type 2 diabetes mellitus with hyperglycemia, with long-term current use of insulin (HCC) -     POCT glucose (manual entry) -     POCT glycosylated hemoglobin (Hb A1C) -     CMP14+EGFR -     lisinopril (ZESTRIL) 2.5 MG tablet; TAKE 1 TABLET (2.5 MG TOTAL) BY MOUTH DAILY. Continue blood sugar control as discussed in office today, low carbohydrate diet, and regular physical exercise as tolerated, 150 minutes per week (30 min each day, 5 days per week, or 50 min 3 days per week). Keep blood sugar logs with fasting goal of 90-130 mg/dl, post prandial (after you eat) less than 180.  For Hypoglycemia: BS <60 and Hyperglycemia BS >400; contact the clinic ASAP. Annual eye exams and foot exams are recommended.   Dyslipidemia, goal LDL below 70 -     Lipid panel -     atorvastatin (LIPITOR) 40 MG tablet; TAKE 1 TABLET (40 MG TOTAL) BY MOUTH DAILY. -     fenofibrate (TRICOR) 145 MG tablet; Take 1 tablet (145 mg total) by mouth daily. INSTRUCTIONS: Work on a low fat, heart healthy diet and participate in regular aerobic exercise program by working out at least 150 minutes per week; 5 days a week-30 minutes per day. Avoid red meat/beef/steak,  fried foods. junk foods, sodas, sugary drinks, unhealthy snacking, alcohol and smoking.  Drink at least 80 oz of water per day and monitor your carbohydrate intake daily.    Colon cancer screening -     Ambulatory referral to Gastroenterology  Anemia, unspecified type -     CBC  Benign prostatic hyperplasia with urinary obstruction -     tamsulosin (FLOMAX) 0.4 MG CAPS capsule; Take 1 capsule (0.4 mg total) by mouth daily after supper.    Patient has been counseled on age-appropriate routine health concerns for screening and prevention. These are reviewed and up-to-date. Referrals have been placed accordingly. Immunizations are up-to-date or  declined.    Subjective:   Chief Complaint  Patient presents with   Diabetes   Hypertension   HPI Christian Navarro 61 y.o. male presents to office today for follow up to DM, HTN, HPL He has a past medical history of Chronic lower back pain, Chronic neck pain, DDD, Diabetes mellitus, Genital herpes, HTN and Hyperlipidemia.   HTN Blood pressure well controlled with renal dose ACE.  BP Readings from Last 3 Encounters:  08/26/21 138/67  06/29/21 (!) 147/74  05/01/21 110/61    DM 2 Diabetes is poorly controlled. He has not been taking Trulicity and states he was unsure how to use his pen. I was able to demonstrate to him with a sample mock trulicity pen on how to administer. He was able to accurately perform successful teachback. He will start Trulicity 0.75mg  weekly and continue humalog 30 units BID and lantus 50 units nightly. Hyperglycemic complications include peripheral neuropathy for which he has been prescribed lyrica.  LDL near goal with tricor 161 mg daily and atorvastatin 40 mg daily.  Lab Results  Component Value Date   HGBA1C 11.3 (A) 08/26/2021    Lab Results  Component Value Date   LDLCALC 78 01/29/2021     Review of Systems  Constitutional:  Negative for fever, malaise/fatigue and weight loss.  HENT: Negative.  Negative for nosebleeds.   Eyes: Negative.  Negative for blurred vision, double vision  and photophobia.  Respiratory: Negative.  Negative for cough and shortness of breath.   Cardiovascular: Negative.  Negative for chest pain, palpitations and leg swelling.  Gastrointestinal: Negative.  Negative for heartburn, nausea and vomiting.  Musculoskeletal: Negative.  Negative for myalgias.  Neurological: Negative.  Negative for dizziness, focal weakness, seizures and headaches.  Psychiatric/Behavioral: Negative.  Negative for suicidal ideas.    Past Medical History:  Diagnosis Date   Chronic back pain    Chronic lower back pain    Chronic neck pain    DDD  (degenerative disc disease), lumbar    Diabetes mellitus    Genital herpes    Hyperlipidemia     Past Surgical History:  Procedure Laterality Date   SHOULDER SURGERY      No family history on file.  Social History Reviewed with no changes to be made today.   Outpatient Medications Prior to Visit  Medication Sig Dispense Refill   aspirin EC 81 MG tablet Take 1 tablet (81 mg total) by mouth daily. 120 tablet 2   Blood Glucose Monitoring Suppl (TRUE METRIX METER) w/Device KIT Use as instructed. Check blood glucose level by fingerstick three  per day. 1 kit 0   Dulaglutide (TRULICITY) 0.75 MG/0.5ML SOPN Inject 0.75 mg into the skin once a week. 2 mL 1   glucose blood (TRUE METRIX BLOOD GLUCOSE TEST) test strip USE AS INSTRUCTED 100 strip 12   glucose blood (TRUE METRIX BLOOD GLUCOSE TEST) test strip Use as instructed. Check blood glucose level by fingerstick three times per day. 100 each 12   insulin glargine (LANTUS) 100 UNIT/ML Solostar Pen Inject 50 Units into the skin daily at 10 pm. 15 mL 11   insulin lispro (HUMALOG) 100 UNIT/ML KwikPen Inject 30 Units into the skin 2 (two) times daily. 54 mL 3   Insulin Pen Needle (TRUEPLUS PEN NEEDLES) 31G X 5 MM MISC Use as directed to administer insulin 100 each 5   methocarbamol (ROBAXIN) 500 MG tablet Take 1 tablet (500 mg total) by mouth every 8 (eight) hours as needed for muscle spasms. 90 tablet 0   pregabalin (LYRICA) 75 MG capsule TAKE 1 CAPSULE (75 MG TOTAL) BY MOUTH 2 (TWO) TIMES DAILY. 60 capsule 1   sildenafil (VIAGRA) 100 MG tablet Take 0.5-1 tablets (50-100 mg total) by mouth daily as needed for erectile dysfunction. 10 tablet 11   TRUEplus Lancets 28G MISC 1 each by Does not apply route 3 (three) times daily. 100 each 12   TRUEplus Lancets 28G MISC Use as instructed. Check blood glucose level by fingerstick 3 times per day.   E11.65 100 each 3   Vitamin D, Ergocalciferol, (DRISDOL) 50000 units CAPS capsule Take 1 capsule (50,000  Units total) by mouth every 7 (seven) days. 12 capsule 0   atorvastatin (LIPITOR) 40 MG tablet TAKE 1 TABLET (40 MG TOTAL) BY MOUTH DAILY. 90 tablet 2   fenofibrate (TRICOR) 145 MG tablet TAKE 1 TABLET (145 MG TOTAL) BY MOUTH DAILY. 90 tablet 1   lisinopril (ZESTRIL) 2.5 MG tablet TAKE 1 TABLET (2.5 MG TOTAL) BY MOUTH DAILY. 90 tablet 1   tamsulosin (FLOMAX) 0.4 MG CAPS capsule Take 1 capsule (0.4 mg total) by mouth daily after supper. 30 capsule 11   No facility-administered medications prior to visit.    No Known Allergies     Objective:    BP 138/67   Pulse 93   Resp 16   SpO2 98%  Wt Readings from Last 3  Encounters:  06/29/21 180 lb (81.6 kg)  05/01/21 189 lb 4 oz (85.8 kg)  03/30/21 193 lb 3.2 oz (87.6 kg)    Physical Exam Vitals and nursing note reviewed.  Constitutional:      Appearance: He is well-developed.  HENT:     Head: Normocephalic and atraumatic.  Cardiovascular:     Rate and Rhythm: Normal rate and regular rhythm.     Heart sounds: Normal heart sounds. No murmur heard.   No friction rub. No gallop.  Pulmonary:     Effort: Pulmonary effort is normal. No tachypnea or respiratory distress.     Breath sounds: Normal breath sounds. No decreased breath sounds, wheezing, rhonchi or rales.  Chest:     Chest wall: No tenderness.  Abdominal:     General: Bowel sounds are normal.     Palpations: Abdomen is soft.  Musculoskeletal:        General: Normal range of motion.     Cervical back: Normal range of motion.  Skin:    General: Skin is warm and dry.  Neurological:     Mental Status: He is alert and oriented to person, place, and time.     Coordination: Coordination normal.  Psychiatric:        Behavior: Behavior normal. Behavior is cooperative.        Thought Content: Thought content normal.        Judgment: Judgment normal.         Patient has been counseled extensively about nutrition and exercise as well as the importance of adherence with  medications and regular follow-up. The patient was given clear instructions to go to ER or return to medical center if symptoms don't improve, worsen or new problems develop. The patient verbalized understanding.   Follow-up: Return in about 6 months (around 02/26/2022).   Claiborne Rigg, FNP-BC [facility] [place], [place] [phone]   08/26/2021, 10:31 PM

## 2021-08-27 LAB — CMP14+EGFR
ALT: 28 IU/L (ref 0–44)
AST: 27 IU/L (ref 0–40)
Albumin/Globulin Ratio: 1.4 (ref 1.2–2.2)
Albumin: 4.5 g/dL (ref 3.8–4.9)
Alkaline Phosphatase: 75 IU/L (ref 44–121)
BUN/Creatinine Ratio: 17 (ref 9–20)
BUN: 24 mg/dL (ref 6–24)
Bilirubin Total: <0.2 mg/dL (ref 0.0–1.2)
CO2: 25 mmol/L (ref 20–29)
Calcium: 9.5 mg/dL (ref 8.7–10.2)
Chloride: 95 mmol/L — ABNORMAL LOW (ref 96–106)
Creatinine, Ser: 1.38 mg/dL — ABNORMAL HIGH (ref 0.76–1.27)
Globulin, Total: 3.2 g/dL (ref 1.5–4.5)
Glucose: 348 mg/dL — ABNORMAL HIGH (ref 70–99)
Potassium: 4.5 mmol/L (ref 3.5–5.2)
Sodium: 135 mmol/L (ref 134–144)
Total Protein: 7.7 g/dL (ref 6.0–8.5)
eGFR: 59 mL/min/{1.73_m2} — ABNORMAL LOW (ref >59)

## 2021-08-27 LAB — LIPID PANEL
Chol/HDL Ratio: 5.2 ratio — ABNORMAL HIGH (ref 0.0–5.0)
Cholesterol, Total: 181 mg/dL (ref 100–199)
HDL: 35 mg/dL — ABNORMAL LOW (ref >39)
LDL Chol Calc (NIH): 112 mg/dL — ABNORMAL HIGH (ref 0–99)
Triglycerides: 192 mg/dL — ABNORMAL HIGH (ref 0–149)
VLDL Cholesterol Cal: 34 mg/dL (ref 5–40)

## 2021-08-27 LAB — CBC
Hematocrit: 42.5 % (ref 37.5–51.0)
Hemoglobin: 13.6 g/dL (ref 13.0–17.7)
MCH: 27.8 pg (ref 26.6–33.0)
MCHC: 32 g/dL (ref 31.5–35.7)
MCV: 87 fL (ref 79–97)
Platelets: 261 10*3/uL (ref 150–450)
RBC: 4.9 x10E6/uL (ref 4.14–5.80)
RDW: 13 % (ref 11.6–15.4)
WBC: 7.9 10*3/uL (ref 3.4–10.8)

## 2021-08-28 ENCOUNTER — Ambulatory Visit: Payer: Self-pay | Admitting: Nurse Practitioner

## 2021-08-31 ENCOUNTER — Telehealth: Payer: Self-pay

## 2021-08-31 NOTE — Telephone Encounter (Signed)
Called patient reviewed all information and repeated back to me. Will call if any questions.  ? ?

## 2021-09-14 ENCOUNTER — Other Ambulatory Visit: Payer: Self-pay | Admitting: Nurse Practitioner

## 2021-09-14 ENCOUNTER — Other Ambulatory Visit: Payer: Self-pay

## 2021-09-14 DIAGNOSIS — E1165 Type 2 diabetes mellitus with hyperglycemia: Secondary | ICD-10-CM

## 2021-09-14 DIAGNOSIS — E785 Hyperlipidemia, unspecified: Secondary | ICD-10-CM

## 2021-09-14 DIAGNOSIS — Z794 Long term (current) use of insulin: Secondary | ICD-10-CM

## 2021-09-14 DIAGNOSIS — N401 Enlarged prostate with lower urinary tract symptoms: Secondary | ICD-10-CM

## 2021-09-14 DIAGNOSIS — M62838 Other muscle spasm: Secondary | ICD-10-CM

## 2021-09-14 MED ORDER — INSULIN LISPRO (1 UNIT DIAL) 100 UNIT/ML (KWIKPEN)
30.0000 [IU] | PEN_INJECTOR | Freq: Two times a day (BID) | SUBCUTANEOUS | 1 refills | Status: DC
  Filled 2021-09-14: qty 54, 90d supply, fill #0
  Filled 2022-05-24: qty 15, 25d supply, fill #1
  Filled 2022-06-04: qty 18, 30d supply, fill #1

## 2021-09-14 MED ORDER — TRULICITY 0.75 MG/0.5ML ~~LOC~~ SOAJ
0.7500 mg | SUBCUTANEOUS | 0 refills | Status: DC
  Filled 2021-09-14: qty 12, 168d supply, fill #0

## 2021-09-14 MED ORDER — BASAGLAR KWIKPEN 100 UNIT/ML ~~LOC~~ SOPN
50.0000 [IU] | PEN_INJECTOR | Freq: Every day | SUBCUTANEOUS | 0 refills | Status: DC
  Filled 2021-09-14: qty 90, 180d supply, fill #0

## 2021-09-14 MED ORDER — FENOFIBRATE 145 MG PO TABS
145.0000 mg | ORAL_TABLET | Freq: Every day | ORAL | 0 refills | Status: DC
  Filled 2021-09-14: qty 90, 90d supply, fill #0
  Filled 2021-09-15: qty 90, 90d supply, fill #1
  Filled 2022-05-24 – 2022-06-04 (×2): qty 30, 30d supply, fill #1

## 2021-09-14 MED ORDER — ATORVASTATIN CALCIUM 40 MG PO TABS
40.0000 mg | ORAL_TABLET | Freq: Every day | ORAL | 1 refills | Status: DC
  Filled 2021-09-14: qty 180, 180d supply, fill #0
  Filled 2022-05-24 – 2022-06-04 (×2): qty 30, 30d supply, fill #1

## 2021-09-14 MED ORDER — TRUE METRIX BLOOD GLUCOSE TEST VI STRP
ORAL_STRIP | 0 refills | Status: DC
  Filled 2021-09-14: qty 400, 133d supply, fill #0

## 2021-09-14 MED ORDER — METHOCARBAMOL 500 MG PO TABS
500.0000 mg | ORAL_TABLET | Freq: Two times a day (BID) | ORAL | 0 refills | Status: AC | PRN
  Filled 2021-09-14: qty 180, 90d supply, fill #0

## 2021-09-14 MED ORDER — TAMSULOSIN HCL 0.4 MG PO CAPS
0.4000 mg | ORAL_CAPSULE | Freq: Every day | ORAL | 0 refills | Status: AC
  Filled 2021-09-14 (×2): qty 180, 180d supply, fill #0

## 2021-09-15 ENCOUNTER — Other Ambulatory Visit: Payer: Self-pay | Admitting: Nurse Practitioner

## 2021-09-15 ENCOUNTER — Other Ambulatory Visit: Payer: Self-pay

## 2021-09-15 DIAGNOSIS — M542 Cervicalgia: Secondary | ICD-10-CM

## 2021-09-15 DIAGNOSIS — G8929 Other chronic pain: Secondary | ICD-10-CM

## 2021-09-16 ENCOUNTER — Other Ambulatory Visit: Payer: Self-pay

## 2021-09-16 MED ORDER — IBUPROFEN 800 MG PO TABS
ORAL_TABLET | Freq: Three times a day (TID) | ORAL | 2 refills | Status: AC | PRN
  Filled 2021-09-16: qty 60, 20d supply, fill #0

## 2021-09-22 ENCOUNTER — Other Ambulatory Visit: Payer: Self-pay

## 2022-05-24 ENCOUNTER — Other Ambulatory Visit: Payer: Self-pay

## 2022-05-24 ENCOUNTER — Other Ambulatory Visit: Payer: Self-pay | Admitting: Nurse Practitioner

## 2022-05-24 DIAGNOSIS — Z794 Long term (current) use of insulin: Secondary | ICD-10-CM

## 2022-05-24 DIAGNOSIS — E1165 Type 2 diabetes mellitus with hyperglycemia: Secondary | ICD-10-CM

## 2022-05-25 ENCOUNTER — Other Ambulatory Visit: Payer: Self-pay

## 2022-05-25 MED ORDER — TRUE METRIX BLOOD GLUCOSE TEST VI STRP
ORAL_STRIP | 0 refills | Status: DC
  Filled 2022-05-25 – 2022-06-04 (×2): qty 100, 25d supply, fill #0

## 2022-05-25 MED ORDER — BASAGLAR KWIKPEN 100 UNIT/ML ~~LOC~~ SOPN
50.0000 [IU] | PEN_INJECTOR | Freq: Every day | SUBCUTANEOUS | 0 refills | Status: DC
  Filled 2022-05-25 – 2022-06-04 (×2): qty 15, 30d supply, fill #0

## 2022-05-25 MED ORDER — TRULICITY 0.75 MG/0.5ML ~~LOC~~ SOAJ
0.7500 mg | SUBCUTANEOUS | 0 refills | Status: AC
  Filled 2022-05-25 – 2022-06-04 (×2): qty 2, 28d supply, fill #0

## 2022-05-31 ENCOUNTER — Other Ambulatory Visit: Payer: Self-pay

## 2022-06-01 ENCOUNTER — Other Ambulatory Visit: Payer: Self-pay

## 2022-06-04 ENCOUNTER — Other Ambulatory Visit: Payer: Self-pay

## 2023-02-25 ENCOUNTER — Other Ambulatory Visit: Payer: Self-pay | Admitting: Nurse Practitioner

## 2023-02-25 DIAGNOSIS — E1165 Type 2 diabetes mellitus with hyperglycemia: Secondary | ICD-10-CM

## 2023-02-25 DIAGNOSIS — E785 Hyperlipidemia, unspecified: Secondary | ICD-10-CM

## 2023-02-25 NOTE — Telephone Encounter (Signed)
Patient requested the following medications:   Lancets Cholesterol (unsure of the name)    [facility] Phone: [phone]  Fax: [phone]

## 2023-02-25 NOTE — Telephone Encounter (Deleted)
Medication Refill - Medication: insulin lispro (HUMALOG) 100 UNIT/ML KwikPen  Has the patient contacted their pharmacy? no  Preferred Pharmacy (with phone number or street name):  [facility] Phone: [phone]  Fax: [phone]     Has the patient been seen for an appointment in the last year OR does the patient have an upcoming appointment? yes  Agent: Please be advised that RX refills may take up to 3 business days. We ask that you follow-up with your pharmacy.

## 2023-02-25 NOTE — Telephone Encounter (Signed)
Medication Refill - Medication: insulin lispro (HUMALOG) 100 UNIT/ML KwikPen   Has the patient contacted their pharmacy? no Pt called directly in  Preferred Pharmacy (with phone number or street name): [facility]  Phone: [phone] Fax: [phone] Has the patient been seen for an appointment in the last year OR does the patient have an upcoming appointment? yes  Agent: Please be advised that RX refills may take up to 3 business days. We ask that you follow-up with your pharmacy.

## 2023-02-28 ENCOUNTER — Other Ambulatory Visit: Payer: Self-pay

## 2023-02-28 MED ORDER — TRUEPLUS LANCETS 28G MISC
0 refills | Status: DC
  Filled 2023-02-28: qty 100, 33d supply, fill #0

## 2023-02-28 NOTE — Telephone Encounter (Signed)
Requested Prescriptions  Pending Prescriptions Disp Refills   atorvastatin (LIPITOR) 40 MG tablet 180 tablet 1    Sig: Take 1 tablet (40 mg total) by mouth daily.     Cardiovascular:  Antilipid - Statins Failed - 02/25/2023  1:54 PM      Failed - Valid encounter within last 12 months    Recent Outpatient Visits           1 year ago Type 2 diabetes mellitus with hyperglycemia, with long-term current use of insulin [facility])   [place] [facility] [place], [place] W, NP   1 year ago Poorly controlled type 2 diabetes mellitus [facility])   [place] [facility] [place], Shea Stakes, NP   1 year ago Poorly controlled type 2 diabetes mellitus [facility])   [place] [facility] [place], [place] L, RPH-CPP   1 year ago Poorly controlled type 2 diabetes mellitus [facility])   [place] [facility] [place], [place] W, NP   2 years ago Uncontrolled type 2 diabetes mellitus with complication, with long-term current use of insulin (HCC)   [place] [facility] [place], RPH-CPP       Future Appointments             In 4 weeks Claiborne Rigg, NP [facility]            Failed - Lipid Panel in normal range within the last 12 months    Cholesterol, Total  Date Value Ref Range Status  08/26/2021 181 100 - 199 mg/dL Final   LDL Chol Calc (NIH)  Date Value Ref Range Status  08/26/2021 112 (H) 0 - 99 mg/dL Final   Direct LDL  Date Value Ref Range Status  05/25/2013 94.7 mg/dL Final    Comment:    Optimal:  <100 mg/dLNear or Above Optimal:  100-129 mg/dLBorderline High:  130-159 mg/dLHigh:  160-189 mg/dLVery High:  >190 mg/dL   HDL  Date Value Ref Range Status  08/26/2021 35 (L) >39 mg/dL Final   Triglycerides  Date Value Ref Range Status  08/26/2021 192 (H) 0 - 149 mg/dL Final         Passed - Patient is not pregnant        TRUEplus Lancets 28G MISC 100 each 3    Sig: Use as instructed. Check blood glucose level by fingerstick 3 times per day.   E11.65     Endocrinology: Diabetes - Testing Supplies Failed - 02/25/2023  1:54 PM      Failed - Valid encounter within last 12 months    Recent Outpatient Visits           1 year ago Type 2 diabetes mellitus with hyperglycemia, with long-term current use of insulin [facility])   [place]  [facility] [place], [place] W, NP   1 year ago Poorly controlled type 2 diabetes mellitus [facility])   [place] [facility] [place], Shea Stakes, NP   1 year ago Poorly controlled type 2 diabetes mellitus [facility])   [place] [facility] [place], [place] L, RPH-CPP   1 year ago Poorly controlled type 2 diabetes mellitus [facility])    [facility] [place], [place] W, NP   2 years ago Uncontrolled type 2 diabetes mellitus with complication, with long-term current use of  insulin [facility])   [place] [facility] [place], Cornelius Moras, RPH-CPP       Future Appointments             In 4 weeks Claiborne Rigg, NP [facility]

## 2023-02-28 NOTE — Telephone Encounter (Signed)
Requested medication (s) are due for refill today- expired Rx  Requested medication (s) are on the active medication list -yes  Future visit scheduled -yes  Last refill: 09/14/21 #180 1RF  Notes to clinic: expired Rx, fails lab protocol- over 1 year-08/26/21   Requested Prescriptions  Pending Prescriptions Disp Refills   atorvastatin (LIPITOR) 40 MG tablet 180 tablet 1    Sig: Take 1 tablet (40 mg total) by mouth daily.     Cardiovascular:  Antilipid - Statins Failed - 02/25/2023  1:54 PM      Failed - Valid encounter within last 12 months    Recent Outpatient Visits           1 year ago Type 2 diabetes mellitus with hyperglycemia, with long-term current use of insulin [facility])   [place] [facility] [place], [place] W, NP   1 year ago Poorly controlled type 2 diabetes mellitus [facility])   [place] [facility] [place], Shea Stakes, NP   1 year ago Poorly controlled type 2 diabetes mellitus [facility])   [place] [facility] [place], [place] L, RPH-CPP   1 year ago Poorly controlled type 2 diabetes mellitus [facility])   [place] [facility] [place], [place] W, NP   2 years ago Uncontrolled type 2 diabetes mellitus with complication, with long-term current use of insulin (HCC)   [place] [facility] [place] Chalet, RPH-CPP       Future Appointments             In 4 weeks Claiborne Rigg, NP [facility]            Failed - Lipid Panel in normal range within the last 12 months    Cholesterol, Total  Date Value Ref Range Status  08/26/2021 181 100 - 199 mg/dL Final   LDL Chol Calc (NIH)  Date Value Ref Range Status  08/26/2021 112 (H) 0 - 99 mg/dL Final   Direct LDL  Date Value Ref Range Status  05/25/2013 94.7 mg/dL Final    Comment:    Optimal:  <100 mg/dLNear or Above Optimal:  100-129 mg/dLBorderline High:  130-159  mg/dLHigh:  160-189 mg/dLVery High:  >190 mg/dL   HDL  Date Value Ref Range Status  08/26/2021 35 (L) >39 mg/dL Final   Triglycerides  Date Value Ref Range Status  08/26/2021 192 (H) 0 - 149 mg/dL Final         Passed - Patient is not pregnant      Signed Prescriptions Disp Refills   TRUEplus Lancets 28G MISC 100 each 0    Sig: Use as instructed. Check blood glucose level by fingerstick 3 times per day.   E11.65     Endocrinology: Diabetes - Testing Supplies Failed - 02/25/2023  1:54 PM      Failed - Valid encounter within last 12 months    Recent Outpatient Visits           1 year ago Type 2 diabetes mellitus with hyperglycemia, with long-term current use of insulin [facility])   [place] [facility] [place], Shea Stakes, NP   1 year ago Poorly controlled type 2 diabetes mellitus [facility])   [place] [facility] [place], [place] W, NP   1 year ago Poorly controlled type 2 diabetes mellitus [facility])   [facility] [place], Cornel[place] Moras, RPH-CPP   1 year ago Poorly controlled type 2 diabetes mellitus [facility])   [place] [facility] [place], [place] W, NP   2 years ago Uncontrolled type 2 diabetes mellitus with complication, with long-term current use of insulin [facility])   [place] [facility] Drucilla Chalet, RPH-CPP       Future Appointments             In 4 weeks Claiborne Rigg, NP [facility]               Requested Prescriptions  Pending Prescriptions Disp Refills   atorvastatin (LIPITOR) 40 MG tablet 180 tablet 1    Sig: Take 1 tablet (40 mg total) by mouth daily.     Cardiovascular:  Antilipid - Statins Failed - 02/25/2023  1:54 PM      Failed - Valid encounter within last 12 months    Recent Outpatient Visits           1 year ago Type 2 diabetes mellitus with hyperglycemia, with long-term current  use of insulin [facility])   [place] [facility] [place], [place] W, NP   1 year ago Poorly controlled type 2 diabetes mellitus [facility])   [place] [facility] [place], Shea Stakes, NP   1 year ago Poorly controlled type 2 diabetes mellitus [facility])   [place] [facility] [place], [place] L, RPH-CPP   1 year ago Poorly controlled type 2 diabetes mellitus [facility])   [place] [facility] [place], [place] W, NP   2 years ago Uncontrolled type 2 diabetes mellitus with complication, with long-term current use of insulin (HCC)   [place] [facility] Drucilla Chalet, RPH-CPP       Future Appointments             In 4 weeks Claiborne Rigg, NP [facility]            Failed - Lipid Panel in normal range within the last 12 months    Cholesterol, Total  Date Value Ref Range Status  08/26/2021 181 100 - 199 mg/dL Final   LDL Chol Calc (NIH)  Date Value Ref Range Status  08/26/2021 112 (H) 0 - 99 mg/dL Final   Direct LDL  Date Value Ref Range Status  05/25/2013 94.7 mg/dL Final    Comment:    Optimal:  <100 mg/dLNear or Above Optimal:  100-129 mg/dLBorderline High:  130-159 mg/dLHigh:  160-189 mg/dLVery High:  >190 mg/dL   HDL  Date Value Ref Range Status  08/26/2021 35 (L) >39 mg/dL Final   Triglycerides  Date Value Ref Range Status  08/26/2021 192 (H) 0 - 149 mg/dL Final         Passed - Patient is not pregnant      Signed Prescriptions Disp Refills   TRUEplus Lancets 28G MISC 100 each 0    Sig: Use as instructed. Check blood glucose level by fingerstick 3 times per day.   E11.65     Endocrinology: Diabetes - Testing Supplies Failed - 02/25/2023  1:54 PM      Failed - Valid encounter within last 12 months    Recent Outpatient Visits           1 year  ago Type 2 diabetes mellitus with hyperglycemia, with long-term  current use of insulin [facility])   [place] [facility] [place], [place] W, NP   1 year ago Poorly controlled type 2 diabetes mellitus [facility])   [place] [facility] [place], Shea Stakes, NP   1 year ago Poorly controlled type 2 diabetes mellitus [facility])   [place] [facility] [place], [place] L, RPH-CPP   1 year ago Poorly controlled type 2 diabetes mellitus [facility])   [place] [facility] [place], [place] W, NP   2 years ago Uncontrolled type 2 diabetes mellitus with complication, with long-term current use of insulin [facility])   [place] [facility] Drucilla Chalet, RPH-CPP       Future Appointments             In 4 weeks Claiborne Rigg, NP [facility]

## 2023-03-01 ENCOUNTER — Other Ambulatory Visit: Payer: Self-pay

## 2023-03-01 MED ORDER — ATORVASTATIN CALCIUM 40 MG PO TABS
40.0000 mg | ORAL_TABLET | Freq: Every day | ORAL | 1 refills | Status: DC
  Filled 2023-03-01 – 2023-03-08 (×2): qty 90, 90d supply, fill #0

## 2023-03-08 ENCOUNTER — Other Ambulatory Visit: Payer: Self-pay

## 2023-03-08 ENCOUNTER — Ambulatory Visit: Payer: Self-pay | Attending: Nurse Practitioner | Admitting: Nurse Practitioner

## 2023-03-08 ENCOUNTER — Other Ambulatory Visit: Payer: Self-pay | Admitting: Nurse Practitioner

## 2023-03-08 ENCOUNTER — Encounter: Payer: Self-pay | Admitting: Nurse Practitioner

## 2023-03-08 ENCOUNTER — Other Ambulatory Visit: Payer: Self-pay | Admitting: Family Medicine

## 2023-03-08 VITALS — BP 147/77 | HR 86 | Ht 66.0 in | Wt 188.8 lb

## 2023-03-08 DIAGNOSIS — E1165 Type 2 diabetes mellitus with hyperglycemia: Secondary | ICD-10-CM

## 2023-03-08 DIAGNOSIS — D649 Anemia, unspecified: Secondary | ICD-10-CM

## 2023-03-08 DIAGNOSIS — Z794 Long term (current) use of insulin: Secondary | ICD-10-CM

## 2023-03-08 DIAGNOSIS — E785 Hyperlipidemia, unspecified: Secondary | ICD-10-CM

## 2023-03-08 DIAGNOSIS — E11319 Type 2 diabetes mellitus with unspecified diabetic retinopathy without macular edema: Secondary | ICD-10-CM

## 2023-03-08 DIAGNOSIS — L739 Follicular disorder, unspecified: Secondary | ICD-10-CM

## 2023-03-08 DIAGNOSIS — Z1211 Encounter for screening for malignant neoplasm of colon: Secondary | ICD-10-CM

## 2023-03-08 DIAGNOSIS — F17200 Nicotine dependence, unspecified, uncomplicated: Secondary | ICD-10-CM

## 2023-03-08 DIAGNOSIS — N529 Male erectile dysfunction, unspecified: Secondary | ICD-10-CM

## 2023-03-08 DIAGNOSIS — E559 Vitamin D deficiency, unspecified: Secondary | ICD-10-CM

## 2023-03-08 MED ORDER — ATORVASTATIN CALCIUM 40 MG PO TABS
40.0000 mg | ORAL_TABLET | Freq: Every day | ORAL | 1 refills | Status: DC
  Filled 2023-06-03: qty 90, 90d supply, fill #0
  Filled 2023-09-19: qty 30, 30d supply, fill #1
  Filled 2023-12-02: qty 30, 30d supply, fill #2

## 2023-03-08 MED ORDER — MUPIROCIN 2 % EX OINT
1.0000 | TOPICAL_OINTMENT | Freq: Two times a day (BID) | CUTANEOUS | 3 refills | Status: DC
  Filled 2023-03-08: qty 66, 33d supply, fill #0

## 2023-03-08 MED ORDER — LISINOPRIL 2.5 MG PO TABS
2.5000 mg | ORAL_TABLET | Freq: Every day | ORAL | 1 refills | Status: DC
  Filled 2023-03-08: qty 90, 90d supply, fill #0
  Filled 2023-06-03: qty 90, 90d supply, fill #1

## 2023-03-08 MED ORDER — FENOFIBRATE 145 MG PO TABS
145.0000 mg | ORAL_TABLET | Freq: Every day | ORAL | 1 refills | Status: DC
  Filled 2023-03-08: qty 90, 90d supply, fill #0
  Filled 2023-06-03: qty 90, 90d supply, fill #1

## 2023-03-08 MED ORDER — INSULIN LISPRO (1 UNIT DIAL) 100 UNIT/ML (KWIKPEN)
30.0000 [IU] | PEN_INJECTOR | Freq: Two times a day (BID) | SUBCUTANEOUS | 1 refills | Status: DC
  Filled 2023-03-08: qty 18, 30d supply, fill #0
  Filled 2023-04-01: qty 18, 30d supply, fill #1
  Filled 2023-04-29: qty 15, 25d supply, fill #2
  Filled 2023-06-03: qty 15, 25d supply, fill #3
  Filled 2023-06-27: qty 15, 25d supply, fill #4
  Filled 2023-07-25: qty 15, 25d supply, fill #5
  Filled 2023-08-23: qty 12, 20d supply, fill #6

## 2023-03-08 MED ORDER — TRUEPLUS LANCETS 28G MISC
6 refills | Status: DC
  Filled 2023-03-08: qty 100, 33d supply, fill #0
  Filled 2023-12-02: qty 100, 33d supply, fill #1

## 2023-03-08 MED ORDER — TRUE METRIX BLOOD GLUCOSE TEST VI STRP
ORAL_STRIP | 6 refills | Status: DC
  Filled 2023-03-08: qty 100, 33d supply, fill #0
  Filled 2023-04-29: qty 100, 33d supply, fill #1
  Filled 2023-06-16: qty 100, 33d supply, fill #2
  Filled 2023-07-25: qty 100, 33d supply, fill #3
  Filled 2023-09-19: qty 100, 33d supply, fill #4
  Filled 2023-10-21: qty 100, 33d supply, fill #5
  Filled 2023-12-02: qty 100, 33d supply, fill #6

## 2023-03-08 MED ORDER — BASAGLAR KWIKPEN 100 UNIT/ML ~~LOC~~ SOPN
50.0000 [IU] | PEN_INJECTOR | Freq: Every day | SUBCUTANEOUS | 1 refills | Status: DC
  Filled 2023-03-08: qty 15, 30d supply, fill #0
  Filled 2023-04-29: qty 15, 30d supply, fill #1
  Filled 2023-07-20: qty 15, 30d supply, fill #2
  Filled 2023-08-23: qty 15, 30d supply, fill #3
  Filled 2023-09-19: qty 15, 30d supply, fill #4
  Filled 2023-12-02: qty 15, 30d supply, fill #5

## 2023-03-08 MED ORDER — SILDENAFIL CITRATE 100 MG PO TABS
50.0000 mg | ORAL_TABLET | Freq: Every day | ORAL | 11 refills | Status: DC | PRN
  Filled 2023-03-08: qty 10, 30d supply, fill #0

## 2023-03-08 MED ORDER — TRUEPLUS PEN NEEDLES 31G X 5 MM MISC
5 refills | Status: DC
Start: 2023-03-08 — End: 2024-04-26
  Filled 2023-03-08: qty 100, 33d supply, fill #0

## 2023-03-08 NOTE — Progress Notes (Signed)
Assessment & Plan:  Christian Navarro was seen today for medication refill.  Diagnoses and all orders for this visit:  Type 2 diabetes mellitus with hyperglycemia, with long-term current use of insulin  Poorly controlled. Restart all medications -     Hemoglobin A1c -     CMP14+EGFR -     Urine Albumin/Creatinine with ratio (send out) [LAB689] -     glucose blood (TRUE METRIX BLOOD GLUCOSE TEST) test strip; Use as instructed. Check blood glucose level by fingerstick three times per day. -     Insulin Glargine (BASAGLAR KWIKPEN) 100 UNIT/ML; Inject 50 Units into the skin at bedtime. -     insulin lispro (HUMALOG) 100 UNIT/ML KwikPen; Inject 30 Units into the skin 2 (two) times daily. -     Insulin Pen Needle (TRUEPLUS PEN NEEDLES) 31G X 5 MM MISC; Use as directed to administer insulin -     lisinopril (ZESTRIL) 2.5 MG tablet; Take 1 tablet (2.5 mg total) by mouth daily. -     TRUEplus Lancets 28G MISC; Use as instructed. Check blood glucose level by fingerstick 3 times per day.   E11.65  Colon cancer screening -     Fecal occult blood, imunochemical([facility]/Sun[facility])  Dyslipidemia, goal LDL below 70 LDL not at goal. Needs better dietary control and medication adherence -     fenofibrate (TRICOR) 145 MG tablet; Take 1 tablet (145 mg total) by mouth daily. -     atorvastatin (LIPITOR) 40 MG tablet; Take 1 tablet (40 mg total) by mouth daily. -     Lipid panel  Anemia, unspecified type -     CBC with Differential/Platelet  Erectile dysfunction, unspecified erectile dysfunction type -     sildenafil (VIAGRA) 100 MG tablet; Take 0.5-1 tablets (50-100 mg total) by mouth daily as needed for erectile dysfunction.  Vitamin D deficiency disease -     VITAMIN D 25 Hydroxy (Vit-D Deficiency, Fractures)  Folliculitis -     mupirocin ointment (BACTROBAN) 2 %; Apply 1 Application topically 2 (two) times daily. folliculitis  Tobacco dependence -     CT CHEST LUNG CA SCREEN LOW DOSE W/O CM;  Future  Type 2 diabetes mellitus with retinopathy of both eyes, with long-term current use of insulin, macular edema presence unspecified, unspecified retinopathy severity (HCC) -     Ambulatory referral to Ophthalmology    Patient has been counseled on age-appropriate routine health concerns for screening and prevention. These are reviewed and up-to-date. Referrals have been placed accordingly. Immunizations are up-to-date or declined.    Subjective:   Chief Complaint  Patient presents with   Medication Refill   HPI Christian Navarro 62 y.o. male presents to office today requesting medication refills. I have not seen him in over a year. He states he has been in [place] visiting his family.   DM 2 Poorly controlled. He has been out of his medications for quire some time. Currently prescribed basaglar and humalog. He is on renal dose ACE.  Lab Results  Component Value Date   HGBA1C 11.3 (A) 08/26/2021  LDL not at goal  with atorvastatin 40 mg daily and tricor 145 mg daily.  Lab Results  Component Value Date   LDLCALC 112 (H) 08/26/2021  Blood pressure is elevated. May need to increase lisinopril based on next office reading after starting 2.5 mg daily.  BP Readings from Last 3 Encounters:  03/08/23 (!) 147/77  08/26/21 138/67  06/29/21 (!) 147/74  Folliculocentric, inflamed papules on hair-bearing skin of bilateral inner thighs.   Review of Systems  Constitutional:  Negative for fever, malaise/fatigue and weight loss.  HENT: Negative.  Negative for nosebleeds.   Eyes: Negative.  Negative for blurred vision, double vision and photophobia.  Respiratory: Negative.  Negative for cough and shortness of breath.   Cardiovascular: Negative.  Negative for chest pain, palpitations and leg swelling.  Gastrointestinal: Negative.  Negative for heartburn, nausea and vomiting.  Musculoskeletal: Negative.  Negative for myalgias.  Skin:        SEE HPI  Neurological: Negative.  Negative for  dizziness, focal weakness, seizures and headaches.  Psychiatric/Behavioral: Negative.  Negative for suicidal ideas.     Past Medical History:  Diagnosis Date   Chronic back pain    Chronic lower back pain    Chronic neck pain    DDD (degenerative disc disease), lumbar    Diabetes mellitus    Genital herpes    Hyperlipidemia     Past Surgical History:  Procedure Laterality Date   SHOULDER SURGERY      History reviewed. No pertinent family history.  Social History Reviewed with no changes to be made today.   Outpatient Medications Prior to Visit  Medication Sig Dispense Refill   aspirin EC 81 MG tablet Take 1 tablet (81 mg total) by mouth daily. 120 tablet 2   Blood Glucose Monitoring Suppl (TRUE METRIX METER) w/Device KIT Use as instructed. Check blood glucose level by fingerstick three  per day. 1 kit 0   pregabalin (LYRICA) 75 MG capsule TAKE 1 CAPSULE (75 MG TOTAL) BY MOUTH 2 (TWO) TIMES DAILY. 60 capsule 1   Vitamin D, Ergocalciferol, (DRISDOL) 50000 units CAPS capsule Take 1 capsule (50,000 Units total) by mouth every 7 (seven) days. 12 capsule 0   TRUEplus Lancets 28G MISC 1 each by Does not apply route 3 (three) times daily. 100 each 12   atorvastatin (LIPITOR) 40 MG tablet Take 1 tablet (40 mg total) by mouth daily. (Patient not taking: Reported on 03/08/2023) 180 tablet 1   fenofibrate (TRICOR) 145 MG tablet Take 1 tablet (145 mg total) by mouth daily. Requesting 6 month supply. [place] 180 tablet 0   glucose blood (TRUE METRIX BLOOD GLUCOSE TEST) test strip Use as instructed. Needs PCP appt. (Patient not taking: Reported on 03/08/2023) 100 strip 0   Insulin Glargine (BASAGLAR KWIKPEN) 100 UNIT/ML Inject 50 Units into the skin at bedtime. (Patient not taking: Reported on 03/08/2023) 15 mL 0   insulin lispro (HUMALOG) 100 UNIT/ML KwikPen Inject 30 Units into the skin 2 (two) times daily. Requesting 6 month supply. Going [place] the country 54 mL 1   Insulin Pen  Needle (TRUEPLUS PEN NEEDLES) 31G X 5 MM MISC Use as directed to administer insulin (Patient not taking: Reported on 03/08/2023) 100 each 5   lisinopril (ZESTRIL) 2.5 MG tablet TAKE 1 TABLET (2.5 MG TOTAL) BY MOUTH DAILY. 90 tablet 1   sildenafil (VIAGRA) 100 MG tablet Take 0.5-1 tablets (50-100 mg total) by mouth daily as needed for erectile dysfunction. (Patient not taking: Reported on 03/08/2023) 10 tablet 11   TRUEplus Lancets 28G MISC Use as instructed. Check blood glucose level by fingerstick 3 times per day.   E11.65 (Patient not taking: Reported on 03/08/2023) 100 each 0   No facility-administered medications prior to visit.    No Known Allergies     Objective:    BP (!) 147/77 (BP Location:  Left Arm, Patient Position: Sitting, Cuff Size: Normal)   Pulse 86   Ht 5\' 6"  (1.676 m)   Wt 188 lb 12.8 oz (85.6 kg)   SpO2 99%   BMI 30.47 kg/m  Wt Readings from Last 3 Encounters:  03/08/23 188 lb 12.8 oz (85.6 kg)  06/29/21 180 lb (81.6 kg)  05/01/21 189 lb 4 oz (85.8 kg)    Physical Exam Vitals and nursing note reviewed.  Constitutional:      Appearance: He is well-developed.  HENT:     Head: Normocephalic and atraumatic.  Cardiovascular:     Rate and Rhythm: Normal rate and regular rhythm.     Heart sounds: Normal heart sounds. No murmur heard.    No friction rub. No gallop.  Pulmonary:     Effort: Pulmonary effort is normal. No tachypnea or respiratory distress.     Breath sounds: Normal breath sounds. No decreased breath sounds, wheezing, rhonchi or rales.  Chest:     Chest wall: No tenderness.  Abdominal:     General: Bowel sounds are normal.     Palpations: Abdomen is soft.  Musculoskeletal:        General: Normal range of motion.     Cervical back: Normal range of motion.  Skin:    General: Skin is warm and dry.          Comments: Folliculitis present on both thighs.   Neurological:     Mental Status: He is alert and oriented to person, place, and time.      Coordination: Coordination normal.  Psychiatric:        Behavior: Behavior normal. Behavior is cooperative.        Thought Content: Thought content normal.        Judgment: Judgment normal.          Patient has been counseled extensively about nutrition and exercise as well as the importance of adherence with medications and regular follow-up. The patient was given clear instructions to go to ER or return to medical center if symptoms don't improve, worsen or new problems develop. The patient verbalized understanding.   Follow-up: Return in about 3 months (around 06/07/2023).   Claiborne Rigg, FNP-BC [facility] and [facility]ness [place], [place] [phone]   03/08/2023, 8:35 PM

## 2023-03-09 ENCOUNTER — Other Ambulatory Visit: Payer: Self-pay

## 2023-03-09 ENCOUNTER — Other Ambulatory Visit (HOSPITAL_BASED_OUTPATIENT_CLINIC_OR_DEPARTMENT_OTHER): Payer: Self-pay

## 2023-03-09 LAB — LIPID PANEL
Chol/HDL Ratio: 7.2 ratio — ABNORMAL HIGH (ref 0.0–5.0)
Cholesterol, Total: 217 mg/dL — ABNORMAL HIGH (ref 100–199)
HDL: 30 mg/dL — ABNORMAL LOW (ref >39)
LDL Chol Calc (NIH): 125 mg/dL — ABNORMAL HIGH (ref 0–99)
Triglycerides: 348 mg/dL — ABNORMAL HIGH (ref 0–149)
VLDL Cholesterol Cal: 62 mg/dL — ABNORMAL HIGH (ref 5–40)

## 2023-03-09 LAB — CBC WITH DIFFERENTIAL/PLATELET
Basophils Absolute: 0 10*3/uL (ref 0.0–0.2)
Basos: 1 %
EOS (ABSOLUTE): 0.2 10*3/uL (ref 0.0–0.4)
Eos: 3 %
Hematocrit: 43.4 % (ref 37.5–51.0)
Hemoglobin: 14.2 g/dL (ref 13.0–17.7)
Immature Grans (Abs): 0.1 10*3/uL (ref 0.0–0.1)
Immature Granulocytes: 1 %
Lymphocytes Absolute: 2.9 10*3/uL (ref 0.7–3.1)
Lymphs: 44 %
MCH: 28.5 pg (ref 26.6–33.0)
MCHC: 32.7 g/dL (ref 31.5–35.7)
MCV: 87 fL (ref 79–97)
Monocytes Absolute: 0.5 10*3/uL (ref 0.1–0.9)
Monocytes: 8 %
Neutrophils Absolute: 2.7 10*3/uL (ref 1.4–7.0)
Neutrophils: 43 %
Platelets: 239 10*3/uL (ref 150–450)
RBC: 4.99 x10E6/uL (ref 4.14–5.80)
RDW: 13.7 % (ref 11.6–15.4)
WBC: 6.4 10*3/uL (ref 3.4–10.8)

## 2023-03-09 LAB — CMP14+EGFR
ALT: 17 IU/L (ref 0–44)
AST: 17 IU/L (ref 0–40)
Albumin: 4.2 g/dL (ref 3.8–4.9)
Alkaline Phosphatase: 109 IU/L (ref 44–121)
BUN/Creatinine Ratio: 20 (ref 10–24)
BUN: 22 mg/dL (ref 8–27)
Bilirubin Total: 0.2 mg/dL (ref 0.0–1.2)
CO2: 25 mmol/L (ref 20–29)
Calcium: 9.5 mg/dL (ref 8.6–10.2)
Chloride: 96 mmol/L (ref 96–106)
Creatinine, Ser: 1.1 mg/dL (ref 0.76–1.27)
Globulin, Total: 3.2 g/dL (ref 1.5–4.5)
Glucose: 309 mg/dL — ABNORMAL HIGH (ref 70–99)
Potassium: 4.4 mmol/L (ref 3.5–5.2)
Sodium: 134 mmol/L (ref 134–144)
Total Protein: 7.4 g/dL (ref 6.0–8.5)
eGFR: 77 mL/min/{1.73_m2} (ref >59)

## 2023-03-09 LAB — MICROALBUMIN / CREATININE URINE RATIO
Creatinine, Urine: 35.8 mg/dL
Microalb/Creat Ratio: 110 mg/g creat — ABNORMAL HIGH (ref 0–29)
Microalbumin, Urine: 39.3 ug/mL

## 2023-03-09 LAB — HEMOGLOBIN A1C
Est. average glucose Bld gHb Est-mCnc: 301 mg/dL
Hgb A1c MFr Bld: 12.1 % — ABNORMAL HIGH (ref 4.8–5.6)

## 2023-03-09 LAB — VITAMIN D 25 HYDROXY (VIT D DEFICIENCY, FRACTURES): Vit D, 25-Hydroxy: 12.3 ng/mL — ABNORMAL LOW (ref 30.0–100.0)

## 2023-03-19 ENCOUNTER — Other Ambulatory Visit: Payer: Self-pay | Admitting: Nurse Practitioner

## 2023-03-19 MED ORDER — VITAMIN D (ERGOCALCIFEROL) 1.25 MG (50000 UNIT) PO CAPS
50000.0000 [IU] | ORAL_CAPSULE | ORAL | 0 refills | Status: DC
  Filled 2023-03-19: qty 12, 84d supply, fill #0

## 2023-03-21 ENCOUNTER — Other Ambulatory Visit: Payer: Self-pay

## 2023-03-22 ENCOUNTER — Other Ambulatory Visit: Payer: Self-pay

## 2023-03-28 ENCOUNTER — Ambulatory Visit: Payer: Self-pay | Admitting: Nurse Practitioner

## 2023-04-01 ENCOUNTER — Other Ambulatory Visit: Payer: Self-pay

## 2023-04-13 ENCOUNTER — Other Ambulatory Visit: Payer: Self-pay

## 2023-04-29 ENCOUNTER — Other Ambulatory Visit: Payer: Self-pay

## 2023-06-03 ENCOUNTER — Other Ambulatory Visit: Payer: Self-pay

## 2023-06-14 ENCOUNTER — Ambulatory Visit: Payer: Self-pay | Admitting: Nurse Practitioner

## 2023-06-16 ENCOUNTER — Other Ambulatory Visit: Payer: Self-pay

## 2023-06-27 ENCOUNTER — Other Ambulatory Visit: Payer: Self-pay

## 2023-07-04 ENCOUNTER — Encounter: Payer: Self-pay | Admitting: Nurse Practitioner

## 2023-07-04 ENCOUNTER — Other Ambulatory Visit: Payer: Self-pay

## 2023-07-04 ENCOUNTER — Ambulatory Visit: Payer: Self-pay | Attending: Nurse Practitioner | Admitting: Nurse Practitioner

## 2023-07-04 VITALS — BP 135/79 | HR 80 | Resp 19 | Ht 66.0 in | Wt 189.2 lb

## 2023-07-04 DIAGNOSIS — E7800 Pure hypercholesterolemia, unspecified: Secondary | ICD-10-CM

## 2023-07-04 DIAGNOSIS — E1165 Type 2 diabetes mellitus with hyperglycemia: Secondary | ICD-10-CM

## 2023-07-04 DIAGNOSIS — M79642 Pain in left hand: Secondary | ICD-10-CM | POA: Diagnosis not present

## 2023-07-04 DIAGNOSIS — B359 Dermatophytosis, unspecified: Secondary | ICD-10-CM

## 2023-07-04 DIAGNOSIS — E559 Vitamin D deficiency, unspecified: Secondary | ICD-10-CM

## 2023-07-04 DIAGNOSIS — Z794 Long term (current) use of insulin: Secondary | ICD-10-CM

## 2023-07-04 LAB — POCT GLYCOSYLATED HEMOGLOBIN (HGB A1C): Hemoglobin A1C: 12.4 % — AB (ref 4.0–5.6)

## 2023-07-04 MED ORDER — NYSTATIN-TRIAMCINOLONE 100000-0.1 UNIT/GM-% EX OINT
1.0000 | TOPICAL_OINTMENT | Freq: Two times a day (BID) | CUTANEOUS | 1 refills | Status: DC
  Filled 2023-07-04: qty 60, 30d supply, fill #0

## 2023-07-04 MED ORDER — CLOTRIMAZOLE-BETAMETHASONE 1-0.05 % EX CREA
1.0000 | TOPICAL_CREAM | Freq: Every day | CUTANEOUS | 0 refills | Status: DC
  Filled 2023-07-04: qty 30, 30d supply, fill #0

## 2023-07-04 MED ORDER — VITAMIN D (ERGOCALCIFEROL) 1.25 MG (50000 UNIT) PO CAPS
50000.0000 [IU] | ORAL_CAPSULE | ORAL | 0 refills | Status: DC
Start: 2023-07-04 — End: 2024-04-10
  Filled 2023-07-04: qty 12, 84d supply, fill #0

## 2023-07-04 MED ORDER — IBUPROFEN 600 MG PO TABS
600.0000 mg | ORAL_TABLET | Freq: Four times a day (QID) | ORAL | 1 refills | Status: DC | PRN
  Filled 2023-07-04: qty 60, 15d supply, fill #0

## 2023-07-04 MED ORDER — GABAPENTIN 100 MG PO CAPS
100.0000 mg | ORAL_CAPSULE | Freq: Three times a day (TID) | ORAL | 3 refills | Status: DC
  Filled 2023-07-04 (×2): qty 90, 30d supply, fill #0
  Filled 2023-12-02: qty 90, 30d supply, fill #1
  Filled 2024-04-16: qty 90, 30d supply, fill #2

## 2023-07-04 NOTE — Patient Instructions (Signed)
After you finish prescription vitamin D. You can take vitamin D over the counter 2000-5000 units a day.

## 2023-07-04 NOTE — Progress Notes (Signed)
Assessment & Plan:  Christian Navarro was seen today for medical management of chronic issues and hand pain.  Diagnoses and all orders for this visit:  Left hand pain -     ibuprofen (ADVIL) 600 MG tablet; Take 1 tablet (600 mg total) by mouth every 6 (six) hours as needed. -     Uric Acid -     gabapentin (NEURONTIN) 100 MG capsule; Take 1 capsule (100 mg total) by mouth 3 (three) times daily.  Type 2 diabetes mellitus with hyperglycemia, with long-term current use of insulin (HCC) -     POCT glycosylated hemoglobin (Hb A1C) -     CMP14+EGFR  Vitamin D deficiency disease -     Vitamin D, Ergocalciferol, (DRISDOL) 1.25 MG (50000 UNIT) CAPS capsule; Take 1 capsule (50,000 Units total) by mouth every 7 (seven) days.  Hypercholesterolemia -     Lipid panel  Tinea He notes chronic itching and rash in his groin area.  -     clotrimazole-betamethasone (LOTRISONE) cream; Apply 1 Application topically daily.    Patient has been counseled on age-appropriate routine health concerns for screening and prevention. These are reviewed and up-to-date. Referrals have been placed accordingly. Immunizations are up-to-date or declined.    Subjective:   Chief Complaint  Patient presents with   Medical Management of Chronic Issues   Hand Pain    Left hand numbness.     Christian Navarro 63 y.o. male presents to office today with left hand pain and for DM.   He has a past medical history of Chronic back pain, Chronic lower back pain, Chronic neck pain, DDD Lumbar, poorly controlled Diabetes mellitus, Genital herpes, and Hyperlipidemia.   Christian Navarro endorses left hand pain with onset one week ago. Pain starts in the wrist and radiates into the hand with associated numbness and tingling. He has been taking advil for pain. Took 400 mg yesterday.    Dm 2 Diabetes is poorly controlled.  He has not taking his insulin as prescribed.  We had a long discussion today regarding the potential risk of poorly controlled  diabetes including heart attack, stroke, kidney failure and/or death. Lab Results  Component Value Date   HGBA1C 12.4 (A) 07/04/2023    Lab Results  Component Value Date   HGBA1C 12.1 (H) 03/08/2023       Review of Systems  Constitutional:  Negative for fever, malaise/fatigue and weight loss.  HENT: Negative.  Negative for nosebleeds.   Eyes: Negative.  Negative for blurred vision, double vision and photophobia.  Respiratory: Negative.  Negative for cough and shortness of breath.   Cardiovascular: Negative.  Negative for chest pain, palpitations and leg swelling.  Gastrointestinal: Negative.  Negative for heartburn, nausea and vomiting.  Musculoskeletal:  Positive for joint pain. Negative for myalgias.  Skin:  Positive for itching and rash.  Neurological: Negative.  Negative for dizziness, focal weakness, seizures and headaches.  Psychiatric/Behavioral: Negative.  Negative for suicidal ideas.     Past Medical History:  Diagnosis Date   Chronic back pain    Chronic lower back pain    Chronic neck pain    DDD (degenerative disc disease), lumbar    Diabetes mellitus    Genital herpes    Hyperlipidemia     Past Surgical History:  Procedure Laterality Date   SHOULDER SURGERY      No family history on file.  Social History Reviewed with no changes to be made today.   Outpatient Medications Prior  to Visit  Medication Sig Dispense Refill   aspirin EC 81 MG tablet Take 1 tablet (81 mg total) by mouth daily. 120 tablet 2   atorvastatin (LIPITOR) 40 MG tablet Take 1 tablet (40 mg total) by mouth daily. 90 tablet 1   Blood Glucose Monitoring Suppl (TRUE METRIX METER) w/Device KIT Use as instructed. Check blood glucose level by fingerstick three  per day. 1 kit 0   fenofibrate (TRICOR) 145 MG tablet Take 1 tablet (145 mg total) by mouth daily. 90 tablet 1   glucose blood (TRUE METRIX BLOOD GLUCOSE TEST) test strip Use as instructed. Check blood glucose level by fingerstick three  times per day. 200 strip 6   Insulin Glargine (BASAGLAR KWIKPEN) 100 UNIT/ML Inject 50 Units into the skin at bedtime. 45 mL 1   insulin lispro (HUMALOG) 100 UNIT/ML KwikPen Inject 30 Units into the skin 2 (two) times daily. 54 mL 1   Insulin Pen Needle (TRUEPLUS PEN NEEDLES) 31G X 5 MM MISC Use as directed to administer insulin 100 each 5   lisinopril (ZESTRIL) 2.5 MG tablet Take 1 tablet (2.5 mg total) by mouth daily. 90 tablet 1   sildenafil (VIAGRA) 100 MG tablet Take 0.5-1 tablets (50-100 mg total) by mouth daily as needed for erectile dysfunction. 10 tablet 11   TRUEplus Lancets 28G MISC Use as instructed. Check blood glucose level by fingerstick 3 times per day.   E11.65 200 each 6   mupirocin ointment (BACTROBAN) 2 % Apply 1 Application topically 2 (two) times daily. folliculitis (Patient not taking: Reported on 07/04/2023) 66 g 3   pregabalin (LYRICA) 75 MG capsule TAKE 1 CAPSULE (75 MG TOTAL) BY MOUTH 2 (TWO) TIMES DAILY. (Patient not taking: Reported on 07/04/2023) 60 capsule 1   Vitamin D, Ergocalciferol, (DRISDOL) 1.25 MG (50000 UNIT) CAPS capsule Take 1 capsule (50,000 Units total) by mouth every 7 (seven) days. (Patient not taking: Reported on 07/04/2023) 12 capsule 0   No facility-administered medications prior to visit.    No Known Allergies     Objective:    BP 135/79 (BP Location: Left Arm, Patient Position: Sitting, Cuff Size: Normal)   Pulse 80   Resp 19   Ht 5\' 6"  (1.676 m)   Wt 189 lb 3.2 oz (85.8 kg)   SpO2 100%   BMI 30.54 kg/m  Wt Readings from Last 3 Encounters:  07/04/23 189 lb 3.2 oz (85.8 kg)  03/08/23 188 lb 12.8 oz (85.6 kg)  06/29/21 180 lb (81.6 kg)    Physical Exam Vitals and nursing note reviewed.  Constitutional:      Appearance: He is well-developed.  HENT:     Head: Normocephalic and atraumatic.  Cardiovascular:     Rate and Rhythm: Normal rate and regular rhythm.     Heart sounds: Normal heart sounds. No murmur heard.    No friction rub.  No gallop.  Pulmonary:     Effort: Pulmonary effort is normal. No tachypnea or respiratory distress.     Breath sounds: Normal breath sounds. No decreased breath sounds, wheezing, rhonchi or rales.  Chest:     Chest wall: No tenderness.  Abdominal:     General: Bowel sounds are normal.     Palpations: Abdomen is soft.  Musculoskeletal:        General: Normal range of motion.     Cervical back: Normal range of motion.  Skin:    General: Skin is warm and dry.  Neurological:  Mental Status: He is alert and oriented to person, place, and time.     Coordination: Coordination normal.  Psychiatric:        Behavior: Behavior normal. Behavior is cooperative.        Thought Content: Thought content normal.        Judgment: Judgment normal.          Patient has been counseled extensively about nutrition and exercise as well as the importance of adherence with medications and regular follow-up. The patient was given clear instructions to go to ER or return to medical center if symptoms don't improve, worsen or new problems develop. The patient verbalized understanding.   Follow-up: Return in about 3 months (around 10/02/2023).   Claiborne Rigg, FNP-BC [facility] and Wellness [place], [place] [phone]   07/04/2023, 5:41 PM

## 2023-07-05 ENCOUNTER — Other Ambulatory Visit: Payer: Self-pay

## 2023-07-05 LAB — LIPID PANEL
Chol/HDL Ratio: 5.7 {ratio} — ABNORMAL HIGH (ref 0.0–5.0)
Cholesterol, Total: 187 mg/dL (ref 100–199)
HDL: 33 mg/dL — ABNORMAL LOW (ref >39)
LDL Chol Calc (NIH): 129 mg/dL — ABNORMAL HIGH (ref 0–99)
Triglycerides: 140 mg/dL (ref 0–149)
VLDL Cholesterol Cal: 25 mg/dL (ref 5–40)

## 2023-07-05 LAB — CMP14+EGFR
ALT: 26 [IU]/L (ref 0–44)
AST: 19 [IU]/L (ref 0–40)
Albumin: 4.3 g/dL (ref 3.9–4.9)
Alkaline Phosphatase: 99 [IU]/L (ref 44–121)
BUN/Creatinine Ratio: 13 (ref 10–24)
BUN: 20 mg/dL (ref 8–27)
Bilirubin Total: 0.2 mg/dL (ref 0.0–1.2)
CO2: 22 mmol/L (ref 20–29)
Calcium: 9.5 mg/dL (ref 8.6–10.2)
Chloride: 98 mmol/L (ref 96–106)
Creatinine, Ser: 1.49 mg/dL — ABNORMAL HIGH (ref 0.76–1.27)
Globulin, Total: 3.1 g/dL (ref 1.5–4.5)
Glucose: 349 mg/dL — ABNORMAL HIGH (ref 70–99)
Potassium: 5.6 mmol/L — ABNORMAL HIGH (ref 3.5–5.2)
Sodium: 133 mmol/L — ABNORMAL LOW (ref 134–144)
Total Protein: 7.4 g/dL (ref 6.0–8.5)
eGFR: 53 mL/min/{1.73_m2} — ABNORMAL LOW (ref >59)

## 2023-07-05 LAB — URIC ACID: Uric Acid: 5.9 mg/dL (ref 3.8–8.4)

## 2023-07-07 DIAGNOSIS — Z794 Long term (current) use of insulin: Secondary | ICD-10-CM | POA: Diagnosis not present

## 2023-07-07 DIAGNOSIS — E782 Mixed hyperlipidemia: Secondary | ICD-10-CM | POA: Diagnosis not present

## 2023-07-07 DIAGNOSIS — H25011 Cortical age-related cataract, right eye: Secondary | ICD-10-CM | POA: Diagnosis not present

## 2023-07-07 DIAGNOSIS — I10 Essential (primary) hypertension: Secondary | ICD-10-CM | POA: Diagnosis not present

## 2023-07-07 DIAGNOSIS — E1165 Type 2 diabetes mellitus with hyperglycemia: Secondary | ICD-10-CM | POA: Diagnosis not present

## 2023-07-14 ENCOUNTER — Other Ambulatory Visit: Payer: Self-pay

## 2023-07-20 ENCOUNTER — Other Ambulatory Visit: Payer: Self-pay

## 2023-07-20 DIAGNOSIS — E782 Mixed hyperlipidemia: Secondary | ICD-10-CM | POA: Diagnosis not present

## 2023-07-20 DIAGNOSIS — I10 Essential (primary) hypertension: Secondary | ICD-10-CM | POA: Diagnosis not present

## 2023-07-20 DIAGNOSIS — E1165 Type 2 diabetes mellitus with hyperglycemia: Secondary | ICD-10-CM | POA: Diagnosis not present

## 2023-07-20 DIAGNOSIS — L309 Dermatitis, unspecified: Secondary | ICD-10-CM | POA: Diagnosis not present

## 2023-07-25 ENCOUNTER — Other Ambulatory Visit: Payer: Self-pay

## 2023-08-18 DIAGNOSIS — E1165 Type 2 diabetes mellitus with hyperglycemia: Secondary | ICD-10-CM | POA: Diagnosis not present

## 2023-08-18 DIAGNOSIS — E782 Mixed hyperlipidemia: Secondary | ICD-10-CM | POA: Diagnosis not present

## 2023-08-18 DIAGNOSIS — E1140 Type 2 diabetes mellitus with diabetic neuropathy, unspecified: Secondary | ICD-10-CM | POA: Diagnosis not present

## 2023-08-18 DIAGNOSIS — H25011 Cortical age-related cataract, right eye: Secondary | ICD-10-CM | POA: Diagnosis not present

## 2023-08-18 DIAGNOSIS — L309 Dermatitis, unspecified: Secondary | ICD-10-CM | POA: Diagnosis not present

## 2023-08-18 DIAGNOSIS — I10 Essential (primary) hypertension: Secondary | ICD-10-CM | POA: Diagnosis not present

## 2023-08-23 ENCOUNTER — Other Ambulatory Visit: Payer: Self-pay

## 2023-09-19 ENCOUNTER — Other Ambulatory Visit: Payer: Self-pay | Admitting: Nurse Practitioner

## 2023-09-19 ENCOUNTER — Other Ambulatory Visit: Payer: Self-pay | Admitting: Pharmacist

## 2023-09-19 ENCOUNTER — Other Ambulatory Visit: Payer: Self-pay

## 2023-09-19 DIAGNOSIS — E1165 Type 2 diabetes mellitus with hyperglycemia: Secondary | ICD-10-CM

## 2023-09-19 DIAGNOSIS — E785 Hyperlipidemia, unspecified: Secondary | ICD-10-CM

## 2023-09-19 MED ORDER — INSULIN LISPRO (1 UNIT DIAL) 100 UNIT/ML (KWIKPEN)
30.0000 [IU] | PEN_INJECTOR | Freq: Two times a day (BID) | SUBCUTANEOUS | 1 refills | Status: DC
  Filled 2023-09-19: qty 18, 30d supply, fill #0
  Filled 2023-10-21: qty 18, 30d supply, fill #1
  Filled 2023-12-02: qty 18, 30d supply, fill #2

## 2023-09-19 MED ORDER — FENOFIBRATE 145 MG PO TABS
145.0000 mg | ORAL_TABLET | Freq: Every day | ORAL | 1 refills | Status: AC
  Filled 2023-09-19: qty 90, 90d supply, fill #0

## 2023-09-19 MED ORDER — LISINOPRIL 2.5 MG PO TABS
2.5000 mg | ORAL_TABLET | Freq: Every day | ORAL | 1 refills | Status: DC
  Filled 2023-09-19: qty 90, 90d supply, fill #0
  Filled 2024-02-28: qty 90, 90d supply, fill #1

## 2023-10-04 ENCOUNTER — Ambulatory Visit: Payer: Self-pay | Admitting: Nurse Practitioner

## 2023-10-21 ENCOUNTER — Other Ambulatory Visit: Payer: Self-pay

## 2023-10-21 ENCOUNTER — Ambulatory Visit: Attending: Nurse Practitioner | Admitting: Nurse Practitioner

## 2023-10-29 ENCOUNTER — Emergency Department (HOSPITAL_BASED_OUTPATIENT_CLINIC_OR_DEPARTMENT_OTHER)
Admission: EM | Admit: 2023-10-29 | Discharge: 2023-10-29 | Disposition: A | Discharge status: Home / Self Care | Attending: Emergency Medicine | Admitting: Emergency Medicine

## 2023-10-29 ENCOUNTER — Other Ambulatory Visit: Payer: Self-pay

## 2023-10-29 DIAGNOSIS — G6289 Other specified polyneuropathies: Secondary | ICD-10-CM | POA: Insufficient documentation

## 2023-10-29 DIAGNOSIS — E119 Type 2 diabetes mellitus without complications: Secondary | ICD-10-CM | POA: Insufficient documentation

## 2023-10-29 DIAGNOSIS — R031 Nonspecific low blood-pressure reading: Secondary | ICD-10-CM | POA: Diagnosis not present

## 2023-10-29 DIAGNOSIS — Z7982 Long term (current) use of aspirin: Secondary | ICD-10-CM | POA: Insufficient documentation

## 2023-10-29 DIAGNOSIS — Z794 Long term (current) use of insulin: Secondary | ICD-10-CM | POA: Diagnosis not present

## 2023-10-29 DIAGNOSIS — I10 Essential (primary) hypertension: Secondary | ICD-10-CM | POA: Insufficient documentation

## 2023-10-29 DIAGNOSIS — Z79899 Other long term (current) drug therapy: Secondary | ICD-10-CM | POA: Insufficient documentation

## 2023-10-29 DIAGNOSIS — R200 Anesthesia of skin: Secondary | ICD-10-CM | POA: Diagnosis present

## 2023-10-29 LAB — COMPREHENSIVE METABOLIC PANEL WITH GFR
ALT: 18 U/L (ref 0–44)
AST: 23 U/L (ref 15–41)
Albumin: 4 g/dL (ref 3.5–5.0)
Alkaline Phosphatase: 64 U/L (ref 38–126)
Anion gap: 11 (ref 5–15)
BUN: 22 mg/dL (ref 8–23)
CO2: 26 mmol/L (ref 22–32)
Calcium: 9.7 mg/dL (ref 8.9–10.3)
Chloride: 101 mmol/L (ref 98–111)
Creatinine, Ser: 1.35 mg/dL — ABNORMAL HIGH (ref 0.61–1.24)
GFR, Estimated: 60 mL/min — ABNORMAL LOW (ref >60)
Glucose, Bld: 128 mg/dL — ABNORMAL HIGH (ref 70–99)
Potassium: 3.9 mmol/L (ref 3.5–5.1)
Sodium: 138 mmol/L (ref 135–145)
Total Bilirubin: <0.2 mg/dL (ref 0.0–1.2)
Total Protein: 7.3 g/dL (ref 6.5–8.1)

## 2023-10-29 LAB — CBC
HCT: 40.1 % (ref 39.0–52.0)
Hemoglobin: 13.5 g/dL (ref 13.0–17.0)
MCH: 29.2 pg (ref 26.0–34.0)
MCHC: 33.7 g/dL (ref 30.0–36.0)
MCV: 86.6 fL (ref 80.0–100.0)
Platelets: 250 10*3/uL (ref 150–400)
RBC: 4.63 MIL/uL (ref 4.22–5.81)
RDW: 13 % (ref 11.5–15.5)
WBC: 6.7 10*3/uL (ref 4.0–10.5)
nRBC: 0 % (ref 0.0–0.2)

## 2023-10-29 LAB — URINALYSIS, ROUTINE W REFLEX MICROSCOPIC
Bilirubin Urine: NEGATIVE
Glucose, UA: NEGATIVE mg/dL
Hgb urine dipstick: NEGATIVE
Ketones, ur: NEGATIVE mg/dL
Leukocytes,Ua: NEGATIVE
Nitrite: NEGATIVE
Protein, ur: NEGATIVE mg/dL
Specific Gravity, Urine: 1.015 (ref 1.005–1.030)
pH: 6.5 (ref 5.0–8.0)

## 2023-10-29 LAB — CBG MONITORING, ED: Glucose-Capillary: 135 mg/dL — ABNORMAL HIGH (ref 70–99)

## 2023-10-29 NOTE — ED Triage Notes (Addendum)
 Pt POV reporting low diastolic pressure readings at home (146/48) and increased fatigue. Also reporting intermittent numbness in fingers x3 months, taking gabapentin  without improvement. Diabetic.

## 2023-10-29 NOTE — ED Provider Notes (Signed)
 [place] EMERGENCY DEPARTMENT AT [facility]  Provider Note  CSN: [id number] Arrival date & time: 10/29/23 2220  History Chief Complaint  Patient presents with   Numbness    Christian Navarro is a 63 y.o. male with history of HTN, DM, peripheral neuropathy reports his BP has been low at home. Specifically his diastolic has been in the 40s and 50s, but systolic has been 130-140. He has been feeling tired but no CP, SOB, or syncope. He has tingling in his fingers which is chronic, no acute change. He has not been taking his BP medications for the last several days due to the low readings. He is scheduled to see PCP in 2 days.    Home Medications Prior to Admission medications   Medication Sig Start Date End Date Taking? Authorizing Provider  aspirin  EC 81 MG tablet Take 1 tablet (81 mg total) by mouth daily. 03/30/18   Hassie Lint, PA-C  atorvastatin  (LIPITOR) 40 MG tablet Take 1 tablet (40 mg total) by mouth daily. 03/08/23   Fleming, Zelda W, NP  Blood Glucose Monitoring Suppl (TRUE METRIX METER) w/Device KIT Use as instructed. Check blood glucose level by fingerstick three  per day. 01/29/20   Fleming, Zelda W, NP  clotrimazole -betamethasone  (LOTRISONE ) cream Apply 1 Application topically daily. 07/04/23   Fleming, Zelda W, NP  fenofibrate  (TRICOR ) 145 MG tablet Take 1 tablet (145 mg total) by mouth daily. 09/19/23   Newlin, Enobong, MD  gabapentin  (NEURONTIN ) 100 MG capsule Take 1 capsule (100 mg total) by mouth 3 (three) times daily. 07/04/23   Fleming, Zelda W, NP  glucose blood (TRUE METRIX BLOOD GLUCOSE TEST) test strip Use as instructed. Check blood glucose level by fingerstick three times per day. 03/08/23   Fleming, Zelda W, NP  ibuprofen  (ADVIL ) 600 MG tablet Take 1 tablet (600 mg total) by mouth every 6 (six) hours as needed. 07/04/23   Fleming, Zelda W, NP  Insulin  Glargine (BASAGLAR  KWIKPEN) 100 UNIT/ML Inject 50 Units into the skin at bedtime. 03/08/23   Fleming, Zelda W,  NP  insulin  lispro (HUMALOG ) 100 UNIT/ML KwikPen Inject 30 Units into the skin 2 (two) times daily. 09/19/23   Newlin, Enobong, MD  Insulin  Pen Needle (TRUEPLUS PEN NEEDLES) 31G X 5 MM MISC Use as directed to administer insulin  03/08/23   Fleming, Zelda W, NP  lisinopril  (ZESTRIL ) 2.5 MG tablet Take 1 tablet (2.5 mg total) by mouth daily. 09/19/23   Newlin, Enobong, MD  mupirocin  ointment (BACTROBAN ) 2 % Apply 1 Application topically 2 (two) times daily. folliculitis Patient not taking: Reported on 07/04/2023 03/08/23   Fleming, Zelda W, NP  nystatin -triamcinolone  ointment (MYCOLOG) Apply 1 Application topically 2 (two) times daily. 07/04/23   Fleming, Zelda W, NP  sildenafil  (VIAGRA ) 100 MG tablet Take 0.5-1 tablets (50-100 mg total) by mouth daily as needed for erectile dysfunction. 03/08/23   Fleming, Zelda W, NP  TRUEplus Lancets 28G MISC Use as instructed. Check blood glucose level by fingerstick 3 times per day.   E11.65 03/08/23   Collins Dean, NP  Vitamin D , Ergocalciferol , (DRISDOL ) 1.25 MG (50000 UNIT) CAPS capsule Take 1 capsule (50,000 Units total) by mouth every 7 (seven) days. 07/04/23   Fleming, Zelda W, NP     Allergies    Patient has no known allergies.   Review of Systems   Review of Systems Please see HPI for pertinent positives and negatives  Physical Exam BP (!) 124/55   Pulse 87  Temp 98.2 F (36.8 C) (Oral)   Resp 19   SpO2 98%   Physical Exam Vitals and nursing note reviewed.  Constitutional:      Appearance: Normal appearance.  HENT:     Head: Normocephalic and atraumatic.     Nose: Nose normal.     Mouth/Throat:     Mouth: Mucous membranes are moist.  Eyes:     Extraocular Movements: Extraocular movements intact.     Conjunctiva/sclera: Conjunctivae normal.  Cardiovascular:     Rate and Rhythm: Normal rate.  Pulmonary:     Effort: Pulmonary effort is normal.     Breath sounds: Normal breath sounds.  Abdominal:     General: Abdomen is flat.      Palpations: Abdomen is soft.     Tenderness: There is no abdominal tenderness.  Musculoskeletal:        General: No swelling. Normal range of motion.     Cervical back: Neck supple.  Skin:    General: Skin is warm and dry.  Neurological:     General: No focal deficit present.     Mental Status: He is alert.  Psychiatric:        Mood and Affect: Mood normal.     ED Results / Procedures / Treatments   EKG EKG Interpretation Date/Time:  Saturday Oct 29 2023 22:37:08 EDT Ventricular Rate:  88 PR Interval:  172 QRS Duration:  85 QT Interval:  373 QTC Calculation: 452 R Axis:   84  Text Interpretation: Sinus rhythm Borderline right axis deviation Nonspecific T abnormalities, lateral leads No significant change since last tracing Confirmed by Shawnee Dellen [phone]) on 10/29/2023 11:35:47 PM  Procedures Procedures  Medications Ordered in the ED Medications - No data to display  Initial Impression and Plan  Patient here with concerns for low DBP, in the ED, he is borderline low, but no concerning symptoms. Labs done in triage show unremarkable CBC, CMP with CKD at baseline. UA is normal. CBG is well controlled. He was reassured no concerning or emergent findings in the ED. Recommend he follow up with PCP as scheduled to discuss medication regimen. RTED for any other concerns.   ED Course       MDM Rules/Calculators/A&P Medical Decision Making Problems Addressed: Decreased diastolic blood pressure: acute illness or injury Other polyneuropathy: chronic illness or injury  Amount and/or Complexity of Data Reviewed Labs: ordered. Decision-making details documented in ED Course. ECG/medicine tests: ordered and independent interpretation performed. Decision-making details documented in ED Course.  Risk Prescription drug management.     Final Clinical Impression(s) / ED Diagnoses Final diagnoses:  Decreased diastolic blood pressure  Other polyneuropathy    Rx / DC  Orders ED Discharge Orders     None        Charmayne Cooper, MD 10/29/23 2338

## 2023-11-04 DIAGNOSIS — E1140 Type 2 diabetes mellitus with diabetic neuropathy, unspecified: Secondary | ICD-10-CM | POA: Diagnosis not present

## 2023-11-04 DIAGNOSIS — E782 Mixed hyperlipidemia: Secondary | ICD-10-CM | POA: Diagnosis not present

## 2023-11-04 DIAGNOSIS — I10 Essential (primary) hypertension: Secondary | ICD-10-CM | POA: Diagnosis not present

## 2023-11-04 DIAGNOSIS — E1165 Type 2 diabetes mellitus with hyperglycemia: Secondary | ICD-10-CM | POA: Diagnosis not present

## 2023-12-02 ENCOUNTER — Other Ambulatory Visit: Payer: Self-pay

## 2023-12-05 ENCOUNTER — Other Ambulatory Visit: Payer: Self-pay | Admitting: Pharmacist

## 2023-12-05 ENCOUNTER — Other Ambulatory Visit: Payer: Self-pay

## 2023-12-05 MED ORDER — ACCU-CHEK GUIDE TEST VI STRP
ORAL_STRIP | 0 refills | Status: DC
  Filled 2023-12-05: qty 100, 33d supply, fill #0

## 2023-12-05 MED ORDER — ACCU-CHEK GUIDE ME W/DEVICE KIT
PACK | 0 refills | Status: DC
  Filled 2023-12-05: qty 1, 30d supply, fill #0

## 2023-12-05 MED ORDER — ACCU-CHEK SOFTCLIX LANCETS MISC
0 refills | Status: DC
  Filled 2023-12-05: qty 100, 33d supply, fill #0

## 2023-12-08 ENCOUNTER — Other Ambulatory Visit: Payer: Self-pay

## 2023-12-27 DIAGNOSIS — H5203 Hypermetropia, bilateral: Secondary | ICD-10-CM | POA: Diagnosis not present

## 2023-12-29 ENCOUNTER — Telehealth: Payer: Self-pay | Admitting: Nurse Practitioner

## 2023-12-29 DIAGNOSIS — E119 Type 2 diabetes mellitus without complications: Secondary | ICD-10-CM | POA: Diagnosis not present

## 2023-12-29 DIAGNOSIS — Z01818 Encounter for other preprocedural examination: Secondary | ICD-10-CM | POA: Diagnosis not present

## 2023-12-29 DIAGNOSIS — H268 Other specified cataract: Secondary | ICD-10-CM | POA: Diagnosis not present

## 2023-12-29 NOTE — Telephone Encounter (Signed)
 Copied from CRM [id number]. Topic: Clinical - Request for Lab/Test Order >> Dec 29, 2023  9:30 AM Donna BRAVO wrote:  Reason for CRM: patient calling asking for labs for A1c and Cholesterol per eye surgeon Dr Lonni Fairly [facility] phone [phone]

## 2023-12-29 NOTE — Telephone Encounter (Signed)
 Patient will be seen at our [facility] today.

## 2023-12-29 NOTE — Telephone Encounter (Signed)
 Copied from CRM [id number]. Topic: General - Call Back - No Documentation >> Dec 29, 2023 12:15 PM Christian Navarro wrote:  Reason for CRM: Pt is wanting a call back from priscilla

## 2023-12-30 NOTE — Telephone Encounter (Signed)
 Thank you :)

## 2023-12-30 NOTE — Telephone Encounter (Addendum)
 I called Christian Navarro and was able to reach him. His appointment has been scheduled for next Wednesday 01/04/2024 at 11:10 am as requested. Patient acknowledged appointment.

## 2024-01-03 ENCOUNTER — Telehealth: Payer: Self-pay | Admitting: Nurse Practitioner

## 2024-01-03 NOTE — Telephone Encounter (Signed)
 Pt confirmed appt

## 2024-01-04 ENCOUNTER — Encounter: Payer: Self-pay | Admitting: Nurse Practitioner

## 2024-01-04 ENCOUNTER — Ambulatory Visit: Attending: Nurse Practitioner | Admitting: Nurse Practitioner

## 2024-01-04 ENCOUNTER — Other Ambulatory Visit: Payer: Self-pay

## 2024-01-04 VITALS — BP 106/64 | HR 81 | Resp 19 | Ht 66.0 in | Wt 186.6 lb

## 2024-01-04 DIAGNOSIS — L299 Pruritus, unspecified: Secondary | ICD-10-CM

## 2024-01-04 DIAGNOSIS — E785 Hyperlipidemia, unspecified: Secondary | ICD-10-CM

## 2024-01-04 DIAGNOSIS — Z122 Encounter for screening for malignant neoplasm of respiratory organs: Secondary | ICD-10-CM

## 2024-01-04 DIAGNOSIS — E559 Vitamin D deficiency, unspecified: Secondary | ICD-10-CM | POA: Diagnosis not present

## 2024-01-04 DIAGNOSIS — Z794 Long term (current) use of insulin: Secondary | ICD-10-CM | POA: Diagnosis not present

## 2024-01-04 DIAGNOSIS — M75102 Unspecified rotator cuff tear or rupture of left shoulder, not specified as traumatic: Secondary | ICD-10-CM | POA: Insufficient documentation

## 2024-01-04 DIAGNOSIS — E1165 Type 2 diabetes mellitus with hyperglycemia: Secondary | ICD-10-CM | POA: Diagnosis not present

## 2024-01-04 DIAGNOSIS — M5412 Radiculopathy, cervical region: Secondary | ICD-10-CM | POA: Insufficient documentation

## 2024-01-04 DIAGNOSIS — G629 Polyneuropathy, unspecified: Secondary | ICD-10-CM

## 2024-01-04 LAB — POCT URINALYSIS DIP (CLINITEK)
Bilirubin, UA: NEGATIVE
Blood, UA: NEGATIVE
Glucose, UA: >=1000 mg/dL — AB
Ketones, POC UA: NEGATIVE mg/dL
Leukocytes, UA: NEGATIVE
Nitrite, UA: NEGATIVE
POC PROTEIN,UA: NEGATIVE
Spec Grav, UA: 1.01 (ref 1.010–1.025)
Urobilinogen, UA: 0.2 U/dL
pH, UA: 5.5 (ref 5.0–8.0)

## 2024-01-04 LAB — GLUCOSE, POCT (MANUAL RESULT ENTRY)
POC Glucose: 457 mg/dL — AB (ref 70–99)
POC Glucose: 493 mg/dL — AB (ref 70–99)

## 2024-01-04 LAB — POCT GLYCOSYLATED HEMOGLOBIN (HGB A1C): Hemoglobin A1C: 12 % — AB (ref 4.0–5.6)

## 2024-01-04 MED ORDER — DEXCOM G7 RECEIVER DEVI
0 refills | Status: AC
Start: 2024-01-04 — End: ?
  Filled 2024-01-04: qty 1, 365d supply, fill #0
  Filled 2024-01-19: qty 1, 90d supply, fill #0

## 2024-01-04 MED ORDER — INSULIN LISPRO (1 UNIT DIAL) 100 UNIT/ML (KWIKPEN)
30.0000 [IU] | PEN_INJECTOR | Freq: Two times a day (BID) | SUBCUTANEOUS | 1 refills | Status: DC
  Filled 2024-01-04: qty 45, 75d supply, fill #0
  Filled 2024-03-26: qty 18, 30d supply, fill #1

## 2024-01-04 MED ORDER — INSULIN ASPART 100 UNIT/ML IJ SOLN
20.0000 [IU] | Freq: Once | INTRAMUSCULAR | Status: AC
  Administered 2024-01-04: 20 [IU] via SUBCUTANEOUS

## 2024-01-04 MED ORDER — DEXCOM G7 SENSOR MISC
6 refills | Status: DC
  Filled 2024-01-04: qty 2, 20d supply, fill #0

## 2024-01-04 MED ORDER — ATORVASTATIN CALCIUM 40 MG PO TABS
40.0000 mg | ORAL_TABLET | Freq: Every day | ORAL | 1 refills | Status: DC
  Filled 2024-01-04: qty 90, 90d supply, fill #0
  Filled 2024-02-28: qty 30, 30d supply, fill #1
  Filled 2024-04-09: qty 90, 90d supply, fill #1

## 2024-01-04 MED ORDER — BASAGLAR KWIKPEN 100 UNIT/ML ~~LOC~~ SOPN
50.0000 [IU] | PEN_INJECTOR | Freq: Every day | SUBCUTANEOUS | 1 refills | Status: DC
  Filled 2024-01-04: qty 45, 90d supply, fill #0
  Filled 2024-03-26: qty 18, 36d supply, fill #1

## 2024-01-04 NOTE — Progress Notes (Signed)
 Assessment & Plan:  Christian Navarro was seen today for diabetes.  Diagnoses and all orders for this visit:  Type 2 diabetes mellitus with hyperglycemia, with long-term current use of insulin   Required insulin  in office today -     POCT glycosylated hemoglobin (Hb A1C) -     Insulin  Glargine (BASAGLAR  KWIKPEN) 100 UNIT/ML; Inject 50 Units into the skin at bedtime. -     Ambulatory referral to Endocrinology -     insulin  lispro (HUMALOG ) 100 UNIT/ML KwikPen; Inject 30 Units into the skin 2 (two) times daily. -     Continuous Glucose Sensor (DEXCOM G7 SENSOR) MISC; Check blood glucose levels continuously  E11.65 z79.84 -     Continuous Glucose Receiver (DEXCOM G7 RECEIVER) DEVI; Check blood glucose levels continuously  E11.65 z79.84 -     POCT glucose (manual entry) -     insulin  aspart (novoLOG ) injection 20 Units -     POCT glucose (manual entry) -     POCT URINALYSIS DIP (CLINITEK)  Dyslipidemia, goal LDL below 70 -     atorvastatin  (LIPITOR) 40 MG tablet; Take 1 tablet (40 mg total) by mouth daily. INSTRUCTIONS: Work on a low fat, heart healthy diet and participate in regular aerobic exercise program by working out at least 150 minutes per week; 5 days a week-30 minutes per day. Avoid red meat/beef/steak,  fried foods. junk foods, sodas, sugary drinks, unhealthy snacking, alcohol and smoking.  Drink at least 80 oz of water per day and monitor your carbohydrate intake daily.    Vitamin D  deficiency -     VITAMIN D  25 Hydroxy (Vit-D Deficiency, Fractures)  Pruritic condition No visible rash -     Ambulatory referral to Dermatology  Neuropathy Continue gabapentin  as prescribed  When ready when you are pain Patient has been counseled on age-appropriate routine health concerns for screening and prevention. These are reviewed and up-to-date. Referrals have been placed accordingly. Immunizations are up-to-date or declined.    Subjective:   Chief Complaint  Patient presents with   Diabetes     Christian Navarro 63 y.o. male presents to office today for follow-up to diabetes.  He is very upset as he was recently told by her ophthalmologist that he has severe retinopathy due to his poorly controlled diabetes.   DM2 A1c not at goal and blood glucose level in the 400s today.  was given 20 units of fast acting insulin  in office with very little change in glucose reading.  Negative for ketones in the urine. Regarding diabetes management, he has a history of medication nonadherence.  Currently prescribed Humalog  30 units twice daily which he takes based on his own regimen and not as instructed.  He administers between thirty five and forty units based on his blood sugar levels, which he does not consistently check. He bases his insulin  dosage on symptoms like headaches.  He is also prescribed Basaglar  50 units at bedtime which he is not taking as prescribed.  He did not take insulin  today as he skipped breakfast and did not check his blood sugar.   He is requesting a referral to endocrinology or diabetes specialist today. Lab Results  Component Value Date   HGBA1C 12.0 (A) 01/04/2024    Lab Results  Component Value Date   HGBA1C 12.4 (A) 07/04/2023     HTN Blood pressure at goal with renal dose lisinopril  BP Readings from Last 3 Encounters:  01/04/24 106/64  10/29/23 (!) 124/55  07/04/23 135/79  Neuropathy He has been experiencing numbness in his hands and feet for the past five months. He reports that he has only been taking gabapentin  once daily and feels that the medication is ineffective when taken this way.   He reports generalized pruritus in his groin on his back or in hands, present for approximately seven months. He has tried various creams without relief. No associated rash, just itching.  Topical treatments we have tried include Mycolog, triamcinolone , Lotrisone .    Review of Systems  Constitutional:  Negative for fever, malaise/fatigue and weight loss.  HENT:  Negative.  Negative for nosebleeds.   Eyes: Negative.  Negative for blurred vision, double vision and photophobia.  Respiratory: Negative.  Negative for cough and shortness of breath.   Cardiovascular: Negative.  Negative for chest pain, palpitations and leg swelling.  Gastrointestinal: Negative.  Negative for heartburn, nausea and vomiting.  Musculoskeletal: Negative.  Negative for myalgias.  Neurological:  Positive for tingling and sensory change. Negative for dizziness, focal weakness, seizures and headaches.  Psychiatric/Behavioral: Negative.  Negative for suicidal ideas.     Past Medical History:  Diagnosis Date   Chronic back pain    Chronic lower back pain    Chronic neck pain    DDD (degenerative disc disease), lumbar    Diabetes mellitus    Genital herpes    Hyperlipidemia     Past Surgical History:  Procedure Laterality Date   SHOULDER SURGERY      History reviewed. No pertinent family history.  Social History Reviewed with no changes to be made today.   Outpatient Medications Prior to Visit  Medication Sig Dispense Refill   Accu-Chek Softclix Lancets lancets Use to check blood sugar 3 times daily.Must have office visit for refills 100 each 0   aspirin  EC 81 MG tablet Take 1 tablet (81 mg total) by mouth daily. 120 tablet 2   Blood Glucose Monitoring Suppl (ACCU-CHEK GUIDE ME) w/Device KIT Use to check blood sugar 3 times daily.Must have office visit for refills 1 kit 0   clotrimazole -betamethasone  (LOTRISONE ) cream Apply 1 Application topically daily. 30 g 0   fenofibrate  (TRICOR ) 145 MG tablet Take 1 tablet (145 mg total) by mouth daily. 90 tablet 1   gabapentin  (NEURONTIN ) 100 MG capsule Take 1 capsule (100 mg total) by mouth 3 (three) times daily. 90 capsule 3   glucose blood (ACCU-CHEK GUIDE TEST) test strip Use to check blood sugar 3 times daily.Must have office visit for refills 100 each 0   ibuprofen  (ADVIL ) 600 MG tablet Take 1 tablet (600 mg total) by mouth  every 6 (six) hours as needed. 60 tablet 1   Insulin  Pen Needle (TRUEPLUS PEN NEEDLES) 31G X 5 MM MISC Use as directed to administer insulin  100 each 5   lisinopril  (ZESTRIL ) 2.5 MG tablet Take 1 tablet (2.5 mg total) by mouth daily. 90 tablet 1   nystatin -triamcinolone  ointment (MYCOLOG) Apply 1 Application topically 2 (two) times daily. 60 g 1   sildenafil  (VIAGRA ) 100 MG tablet Take 0.5-1 tablets (50-100 mg total) by mouth daily as needed for erectile dysfunction. 10 tablet 11   Vitamin D , Ergocalciferol , (DRISDOL ) 1.25 MG (50000 UNIT) CAPS capsule Take 1 capsule (50,000 Units total) by mouth every 7 (seven) days. 12 capsule 0   atorvastatin  (LIPITOR) 40 MG tablet Take 1 tablet (40 mg total) by mouth daily. 90 tablet 1   Insulin  Glargine (BASAGLAR  KWIKPEN) 100 UNIT/ML Inject 50 Units into the skin at bedtime. 45 mL 1  insulin  lispro (HUMALOG ) 100 UNIT/ML KwikPen Inject 30 Units into the skin 2 (two) times daily. 54 mL 1   mupirocin  ointment (BACTROBAN ) 2 % Apply 1 Application topically 2 (two) times daily. folliculitis (Patient not taking: Reported on 01/04/2024) 66 g 3   No facility-administered medications prior to visit.    No Known Allergies     Objective:    BP 106/64 (BP Location: Left Arm, Patient Position: Sitting, Cuff Size: Normal)   Pulse 81   Resp 19   Ht 5' 6 (1.676 m)   Wt 186 lb 9.6 oz (84.6 kg)   SpO2 97%   BMI 30.12 kg/m  Wt Readings from Last 3 Encounters:  01/04/24 186 lb 9.6 oz (84.6 kg)  07/04/23 189 lb 3.2 oz (85.8 kg)  03/08/23 188 lb 12.8 oz (85.6 kg)    Physical Exam Vitals and nursing note reviewed.  Constitutional:      Appearance: He is well-developed.  HENT:     Head: Normocephalic and atraumatic.  Cardiovascular:     Rate and Rhythm: Normal rate and regular rhythm.     Heart sounds: Normal heart sounds. No murmur heard.    No friction rub. No gallop.  Pulmonary:     Effort: Pulmonary effort is normal. No tachypnea or respiratory distress.      Breath sounds: Normal breath sounds. No decreased breath sounds, wheezing, rhonchi or rales.  Chest:     Chest wall: No tenderness.  Abdominal:     General: Bowel sounds are normal.     Palpations: Abdomen is soft.  Musculoskeletal:        General: Normal range of motion.     Cervical back: Normal range of motion.  Skin:    General: Skin is warm and dry.  Neurological:     Mental Status: He is alert and oriented to person, place, and time.     Coordination: Coordination normal.  Psychiatric:        Behavior: Behavior normal. Behavior is cooperative.        Thought Content: Thought content normal.        Judgment: Judgment normal.          Patient has been counseled extensively about nutrition and exercise as well as the importance of adherence with medications and regular follow-up. The patient was given clear instructions to go to ER or return to medical center if symptoms don't improve, worsen or new problems develop. The patient verbalized understanding.   Follow-up: Return in about 4 weeks (around 02/01/2024) for meter check with luke 4 weeks. See me in 3 months after 04-05-2024.   Christian LELON Servant, FNP-BC [facility] [place], [place] [phone]   01/04/2024, 12:31 PM

## 2024-01-04 NOTE — Patient Instructions (Signed)
 Please call to schedule your lung CT [facility] [address]  [phone]

## 2024-01-06 ENCOUNTER — Other Ambulatory Visit: Payer: Self-pay

## 2024-01-06 ENCOUNTER — Other Ambulatory Visit: Payer: Self-pay | Admitting: Pharmacist

## 2024-01-06 ENCOUNTER — Telehealth: Payer: Self-pay

## 2024-01-06 DIAGNOSIS — E1165 Type 2 diabetes mellitus with hyperglycemia: Secondary | ICD-10-CM

## 2024-01-06 MED ORDER — DEXCOM G7 SENSOR MISC
6 refills | Status: AC
  Filled 2024-01-06 – 2024-01-19 (×2): qty 3, 30d supply, fill #0
  Filled 2024-06-11: qty 3, 30d supply, fill #1
  Filled 2024-06-22 – 2024-07-03 (×2): qty 3, 30d supply, fill #2

## 2024-01-06 NOTE — Telephone Encounter (Signed)
 Pharmacy Patient Advocate Encounter  Received notification from [facility] that Prior Authorization for [facility] G7 SENSOR & RECEIVER has been APPROVED from 01/05/2024 to 07/07/2024   PA #/Case ID/Reference #: [id number]

## 2024-01-13 ENCOUNTER — Other Ambulatory Visit: Payer: Self-pay

## 2024-01-19 ENCOUNTER — Other Ambulatory Visit: Payer: Self-pay

## 2024-02-01 ENCOUNTER — Telehealth: Payer: Self-pay | Admitting: Nurse Practitioner

## 2024-02-01 NOTE — Telephone Encounter (Signed)
 Confirmed appt for 8/21

## 2024-02-02 ENCOUNTER — Ambulatory Visit: Admitting: Pharmacist

## 2024-02-28 ENCOUNTER — Other Ambulatory Visit: Payer: Self-pay | Admitting: Family Medicine

## 2024-02-28 ENCOUNTER — Other Ambulatory Visit: Payer: Self-pay

## 2024-02-28 MED ORDER — ACCU-CHEK GUIDE TEST VI STRP
ORAL_STRIP | 3 refills | Status: AC
  Filled 2024-02-28 – 2024-03-26 (×2): qty 100, 33d supply, fill #0

## 2024-02-29 DIAGNOSIS — E113313 Type 2 diabetes mellitus with moderate nonproliferative diabetic retinopathy with macular edema, bilateral: Secondary | ICD-10-CM | POA: Diagnosis not present

## 2024-02-29 DIAGNOSIS — H268 Other specified cataract: Secondary | ICD-10-CM | POA: Diagnosis not present

## 2024-03-06 ENCOUNTER — Other Ambulatory Visit: Payer: Self-pay

## 2024-03-07 ENCOUNTER — Other Ambulatory Visit: Payer: Self-pay

## 2024-03-08 ENCOUNTER — Other Ambulatory Visit: Payer: Self-pay

## 2024-03-26 ENCOUNTER — Other Ambulatory Visit: Payer: Self-pay

## 2024-04-09 ENCOUNTER — Ambulatory Visit: Attending: Nurse Practitioner | Admitting: Nurse Practitioner

## 2024-04-09 ENCOUNTER — Other Ambulatory Visit: Payer: Self-pay

## 2024-04-09 ENCOUNTER — Encounter: Payer: Self-pay | Admitting: Nurse Practitioner

## 2024-04-09 ENCOUNTER — Other Ambulatory Visit: Payer: Self-pay | Admitting: Nurse Practitioner

## 2024-04-09 VITALS — BP 116/61 | HR 86 | Resp 19 | Ht 66.0 in | Wt 186.0 lb

## 2024-04-09 DIAGNOSIS — R911 Solitary pulmonary nodule: Secondary | ICD-10-CM

## 2024-04-09 DIAGNOSIS — Z794 Long term (current) use of insulin: Secondary | ICD-10-CM

## 2024-04-09 DIAGNOSIS — N529 Male erectile dysfunction, unspecified: Secondary | ICD-10-CM

## 2024-04-09 DIAGNOSIS — E559 Vitamin D deficiency, unspecified: Secondary | ICD-10-CM

## 2024-04-09 DIAGNOSIS — E1165 Type 2 diabetes mellitus with hyperglycemia: Secondary | ICD-10-CM | POA: Diagnosis not present

## 2024-04-09 LAB — POCT GLYCOSYLATED HEMOGLOBIN (HGB A1C): Hemoglobin A1C: 11.5 % — AB (ref 4.0–5.6)

## 2024-04-09 LAB — GLUCOSE, POCT (MANUAL RESULT ENTRY): POC Glucose: 263 mg/dL — AB (ref 70–99)

## 2024-04-09 MED ORDER — BASAGLAR KWIKPEN 100 UNIT/ML ~~LOC~~ SOPN
50.0000 [IU] | PEN_INJECTOR | Freq: Every day | SUBCUTANEOUS | 1 refills | Status: DC
  Filled 2024-04-09: qty 45, 90d supply, fill #0

## 2024-04-09 MED ORDER — LISINOPRIL 2.5 MG PO TABS
2.5000 mg | ORAL_TABLET | Freq: Every day | ORAL | 1 refills | Status: DC
  Filled 2024-04-09 – 2024-04-26 (×2): qty 90, 90d supply, fill #0

## 2024-04-09 NOTE — Progress Notes (Unsigned)
 Assessment & Plan:  [name] was seen today for diabetes.  Diagnoses and all orders for this visit:  Type 2 diabetes mellitus with hyperglycemia, with long-term current use of insulin  (HCC) -     POCT glycosylated hemoglobin (Hb A1C) -     Urine Albumin/Creatinine with ratio (send out) [LAB689] -     POCT glucose (manual entry) -     lisinopril  (ZESTRIL ) 2.5 MG tablet; Take 1 tablet (2.5 mg total) by mouth daily. -     Insulin  Glargine (BASAGLAR  KWIKPEN) 100 UNIT/ML; Inject 50 Units into the skin at bedtime. -     CMP14+EGFR  Lung nodule seen on imaging study -     CT CHEST WO CONTRAST; Future  Vitamin D  deficiency disease -     VITAMIN D  25 Hydroxy (Vit-D Deficiency, Fractures)    Patient has been counseled on age-appropriate routine health concerns for screening and prevention. These are reviewed and up-to-date. Referrals have been placed accordingly. Immunizations are up-to-date or declined.    Subjective:   Chief Complaint  Patient presents with   Diabetes    Christian Navarro 63 y.o. male presents to office today    Lab Results  Component Value Date   HGBA1C 11.5 (A) 04/09/2024    BP Readings from Last 3 Encounters:  04/09/24 116/61  01/04/24 106/64  10/29/23 (!) 124/55    ROS  Past Medical History:  Diagnosis Date   Chronic back pain    Chronic lower back pain    Chronic neck pain    DDD (degenerative disc disease), lumbar    Diabetes mellitus    Genital herpes    Hyperlipidemia     Past Surgical History:  Procedure Laterality Date   SHOULDER SURGERY      History reviewed. No pertinent family history.  Social History Reviewed with no changes to be made today.   Outpatient Medications Prior to Visit  Medication Sig Dispense Refill   Accu-Chek Softclix Lancets lancets Use to check blood sugar 3 times daily.Must have office visit for refills 100 each 0   aspirin  EC 81 MG tablet Take 1 tablet (81 mg total) by mouth daily. 120 tablet 2   atorvastatin   (LIPITOR) 40 MG tablet Take 1 tablet (40 mg total) by mouth daily. 90 tablet 1   Blood Glucose Monitoring Suppl (ACCU-CHEK GUIDE ME) w/Device KIT Use to check blood sugar 3 times daily.Must have office visit for refills 1 kit 0   clotrimazole -betamethasone  (LOTRISONE ) cream Apply 1 Application topically daily. 30 g 0   Continuous Glucose Receiver (DEXCOM G7 RECEIVER) DEVI Check blood glucose levels continuously  E11.65 z79.84 1 each 0   Continuous Glucose Sensor (DEXCOM G7 SENSOR) MISC Check blood glucose levels continuously  E11.65 z79.84 3 each 6   fenofibrate  (TRICOR ) 145 MG tablet Take 1 tablet (145 mg total) by mouth daily. 90 tablet 1   gabapentin  (NEURONTIN ) 100 MG capsule Take 1 capsule (100 mg total) by mouth 3 (three) times daily. 90 capsule 3   glucose blood (ACCU-CHEK GUIDE TEST) test strip Use to check blood sugar 3 times daily. 100 each 3   ibuprofen  (ADVIL ) 600 MG tablet Take 1 tablet (600 mg total) by mouth every 6 (six) hours as needed. 60 tablet 1   insulin  lispro (HUMALOG ) 100 UNIT/ML KwikPen Inject 30 Units into the skin 2 (two) times daily. 54 mL 1   Insulin  Pen Needle (TRUEPLUS PEN NEEDLES) 31G X 5 MM MISC Use as directed to administer  insulin  100 each 5   nystatin -triamcinolone  ointment (MYCOLOG) Apply 1 Application topically 2 (two) times daily. 60 g 1   sildenafil  (VIAGRA ) 100 MG tablet Take 0.5-1 tablets (50-100 mg total) by mouth daily as needed for erectile dysfunction. 10 tablet 11   Insulin  Glargine (BASAGLAR  KWIKPEN) 100 UNIT/ML Inject 50 Units into the skin at bedtime. 45 mL 1   lisinopril  (ZESTRIL ) 2.5 MG tablet Take 1 tablet (2.5 mg total) by mouth daily. 90 tablet 1   mupirocin  ointment (BACTROBAN ) 2 % Apply 1 Application topically 2 (two) times daily. folliculitis (Patient not taking: Reported on 04/09/2024) 66 g 3   Vitamin D , Ergocalciferol , (DRISDOL ) 1.25 MG (50000 UNIT) CAPS capsule Take 1 capsule (50,000 Units total) by mouth every 7 (seven) days. (Patient not  taking: Reported on 04/09/2024) 12 capsule 0   No facility-administered medications prior to visit.    No Known Allergies     Objective:    BP 116/61 (BP Location: Right Arm, Patient Position: Sitting, Cuff Size: Normal)   Pulse 86   Resp 19   Ht 5' 6 (1.676 m)   SpO2 100%   BMI 30.12 kg/m  Wt Readings from Last 3 Encounters:  01/04/24 186 lb 9.6 oz (84.6 kg)  07/04/23 189 lb 3.2 oz (85.8 kg)  03/08/23 188 lb 12.8 oz (85.6 kg)    Physical Exam       Patient has been counseled extensively about nutrition and exercise as well as the importance of adherence with medications and regular follow-up. The patient was given clear instructions to go to ER or return to medical center if symptoms don't improve, worsen or new problems develop. The patient verbalized understanding.   Follow-up: Return for sensor placement with luke. first available. See me in 3.5 months.   Haze LELON Servant, FNP-BC [facility] [place], [place] [phone]   04/09/2024, 11:33 AM

## 2024-04-09 NOTE — Patient Instructions (Addendum)
 Sent Referral to [facility] Endocrinology - [facility] [phone] [address], [place] [address] [phone] / [phone] April 2nd at 1030.

## 2024-04-10 ENCOUNTER — Ambulatory Visit: Payer: Self-pay | Admitting: Nurse Practitioner

## 2024-04-10 ENCOUNTER — Other Ambulatory Visit: Payer: Self-pay

## 2024-04-10 DIAGNOSIS — E559 Vitamin D deficiency, unspecified: Secondary | ICD-10-CM

## 2024-04-10 MED ORDER — VITAMIN D (ERGOCALCIFEROL) 1.25 MG (50000 UNIT) PO CAPS
50000.0000 [IU] | ORAL_CAPSULE | ORAL | 0 refills | Status: AC
  Filled 2024-04-10: qty 12, 84d supply, fill #0

## 2024-04-10 MED ORDER — SILDENAFIL CITRATE 100 MG PO TABS
50.0000 mg | ORAL_TABLET | Freq: Every day | ORAL | 0 refills | Status: DC | PRN
  Filled 2024-04-10: qty 10, 10d supply, fill #0

## 2024-04-10 NOTE — Telephone Encounter (Signed)
 Requested Prescriptions  Pending Prescriptions Disp Refills   sildenafil  (VIAGRA ) 100 MG tablet 10 tablet 0    Sig: Take 0.5-1 tablets (50-100 mg total) by mouth daily as needed for erectile dysfunction.     Urology: Erectile Dysfunction Agents Passed - 04/10/2024  4:25 PM      Passed - AST in normal range and within 360 days    AST  Date Value Ref Range Status  04/09/2024 19 0 - 40 IU/L Final         Passed - ALT in normal range and within 360 days    ALT  Date Value Ref Range Status  04/09/2024 23 0 - 44 IU/L Final         Passed - Last BP in normal range    BP Readings from Last 1 Encounters:  04/09/24 116/61         Passed - Valid encounter within last 12 months    Recent Outpatient Visits           Yesterday Type 2 diabetes mellitus with hyperglycemia, with long-term current use of insulin  (HCC)   [facility] [place]. [facility] [place], [place] W, NP   3 months ago Type 2 diabetes mellitus with hyperglycemia, with long-term current use of insulin  [facility])   [facility] [place]. [facility] Theotis Haze [place]N, NP   9 months ago Left hand pain   [facility] [place]. [facility] [place], [place] W, NP   1 year ago Type 2 diabetes mellitus with hyperglycemia, with long-term current use of insulin  [facility])   [facility] [place]. [facility] [place], [place] W, NP   2 years ago Type 2 diabetes mellitus with hyperglycemia, with long-term current use of insulin  [facility])   [facility] . [facility] Theotis Haze ORN, [place]

## 2024-04-11 ENCOUNTER — Other Ambulatory Visit: Payer: Self-pay

## 2024-04-11 LAB — CMP14+EGFR
ALT: 23 IU/L (ref 0–44)
AST: 19 IU/L (ref 0–40)
Albumin: 4.1 g/dL (ref 3.9–4.9)
Alkaline Phosphatase: 97 IU/L (ref 47–123)
BUN/Creatinine Ratio: 19 (ref 10–24)
BUN: 22 mg/dL (ref 8–27)
Bilirubin Total: 0.2 mg/dL (ref 0.0–1.2)
CO2: 24 mmol/L (ref 20–29)
Calcium: 9.3 mg/dL (ref 8.6–10.2)
Chloride: 103 mmol/L (ref 96–106)
Creatinine, Ser: 1.17 mg/dL (ref 0.76–1.27)
Globulin, Total: 2.6 g/dL (ref 1.5–4.5)
Glucose: 236 mg/dL — ABNORMAL HIGH (ref 70–99)
Potassium: 4.4 mmol/L (ref 3.5–5.2)
Sodium: 139 mmol/L (ref 134–144)
Total Protein: 6.7 g/dL (ref 6.0–8.5)
eGFR: 70 mL/min/1.73 (ref >59)

## 2024-04-11 LAB — MICROALBUMIN / CREATININE URINE RATIO
Creatinine, Urine: 217.3 mg/dL
Microalb/Creat Ratio: 15 mg/g{creat} (ref 0–29)
Microalbumin, Urine: 32.9 ug/mL

## 2024-04-11 LAB — VITAMIN D 25 HYDROXY (VIT D DEFICIENCY, FRACTURES): Vit D, 25-Hydroxy: 16.2 ng/mL — ABNORMAL LOW (ref 30.0–100.0)

## 2024-04-12 ENCOUNTER — Encounter: Payer: Self-pay | Admitting: Nurse Practitioner

## 2024-04-16 ENCOUNTER — Other Ambulatory Visit: Payer: Self-pay

## 2024-04-19 ENCOUNTER — Telehealth: Payer: Self-pay

## 2024-04-19 NOTE — Telephone Encounter (Signed)
 Copied from CRM #[id number]. Topic: Referral - Question >> Apr 19, 2024  3:47 PM Emylou G wrote: Reason for CRM: Pls fax over clinical notes for his referral : chest ct and nodule.SABRA Pls fax over: [phone] attn: Tia w/[facility] [phone]

## 2024-04-19 NOTE — Telephone Encounter (Signed)
 Clinical notes faxed and conformation received.

## 2024-04-24 DIAGNOSIS — R918 Other nonspecific abnormal finding of lung field: Secondary | ICD-10-CM | POA: Diagnosis not present

## 2024-04-25 ENCOUNTER — Telehealth: Payer: Self-pay | Admitting: Nurse Practitioner

## 2024-04-25 NOTE — Telephone Encounter (Signed)
 Confirmed appt for 11/13

## 2024-04-26 ENCOUNTER — Other Ambulatory Visit: Payer: Self-pay

## 2024-04-26 ENCOUNTER — Encounter: Payer: Self-pay | Admitting: Pharmacist

## 2024-04-26 ENCOUNTER — Ambulatory Visit: Attending: Nurse Practitioner | Admitting: Pharmacist

## 2024-04-26 ENCOUNTER — Other Ambulatory Visit: Payer: Self-pay | Admitting: Pharmacist

## 2024-04-26 DIAGNOSIS — Z794 Long term (current) use of insulin: Secondary | ICD-10-CM | POA: Diagnosis not present

## 2024-04-26 DIAGNOSIS — E1165 Type 2 diabetes mellitus with hyperglycemia: Secondary | ICD-10-CM

## 2024-04-26 MED ORDER — INSULIN LISPRO (1 UNIT DIAL) 100 UNIT/ML (KWIKPEN)
40.0000 [IU] | PEN_INJECTOR | Freq: Two times a day (BID) | SUBCUTANEOUS | 1 refills | Status: AC
  Filled 2024-04-26: qty 30, 38d supply, fill #0
  Filled 2024-06-22: qty 30, 38d supply, fill #1

## 2024-04-26 MED ORDER — TRUEPLUS PEN NEEDLES 31G X 5 MM MISC
5 refills | Status: AC
  Filled 2024-04-26: qty 100, 50d supply, fill #0
  Filled 2024-06-11: qty 100, 33d supply, fill #0

## 2024-04-26 MED ORDER — TOUJEO SOLOSTAR 300 UNIT/ML ~~LOC~~ SOPN
50.0000 [IU] | PEN_INJECTOR | Freq: Every day | SUBCUTANEOUS | 3 refills | Status: DC
  Filled 2024-04-26: qty 4.5, 27d supply, fill #0

## 2024-04-26 NOTE — Progress Notes (Signed)
 S:    PCP: Zelda  Patient arrives in good spirits. Presents for diabetes evaluation, education, and management.  Patient was referred and last seen by Primary Care Provider on 04/09/2024. I've seen this patient in the past and he has issues with medication non-adherence.   Today, he reports doing well. He will often times omit or modify his Humalog  dose. Tells me today he will take 30 units before meals unless he notices his sugar is high. He tells me he injects 40 units of Humalog  when he notices his sugar is > 250 mg/dL. Alsoi, he is only injecting 1-2 minutes prior to eating. He does inject Lantus  every day. Patient reports Diabetes was diagnosed in 1998.   Family/Social History:  FHx: no pertinent positives Tobacco: current every day smoker Alcohol: denies  Insurance coverage/medication affordability: self pay  Denies medication adherence. Current diabetes medications include: Basaglar  50 units daily (at bedtime), Humalog  30 units BID before meals (takes 40 units sometimes BID to TID)  Patient denies hypoglycemic events.  Patient reported dietary habits: Eats 3 meals/day Diet: 3 meals per day - Breakfast: cinnamon bread, cheese - Lunch: chicken, fish, salad - Dinner: 3am, chicken, steak, burger, rice - Snacks: Snickers every 2 or 3 days - Drinks: water, orange juice   Patient reports nocturia (nighttime urination). Uses restroom 5x at night.  Patient reports neuropathy (nerve pain). Patient reports visual changes. Patient reports self foot exams.   O:  Date of Download: 04/26/2024 % Time CGM is active: 99% Average Glucose: 258 mg/dL Glucose Management Indicator: NA  Glucose Variability: NA (goal <36%) Time in Goal:  - Time in range 70-180: 21% - Time above range: 76% - Time below range: 1% Observed patterns:  Lab Results  Component Value Date   HGBA1C 11.5 (A) 04/09/2024   There were no vitals filed for this visit.  Lipid Panel     Component Value  Date/Time   CHOL 187 07/04/2023 1537   TRIG 140 07/04/2023 1537   HDL 33 (L) 07/04/2023 1537   CHOLHDL 5.7 (H) 07/04/2023 1537   CHOLHDL 6.5 (H) 04/05/2016 1056   VLDL 64 (H) 04/05/2016 1056   LDLCALC 129 (H) 07/04/2023 1537   LDLDIRECT 94.7 05/25/2013 1612   Home fasting blood sugars: not checking   Clinical Atherosclerotic Cardiovascular Disease (ASCVD): No  The 10-year ASCVD risk score (Arnett DK, et al., 2019) is: 32.4%   Values used to calculate the score:     Age: 63 years     Clincally relevant sex: Male     Is Non-Hispanic African American: No     Diabetic: Yes     Tobacco smoker: Yes     Systolic Blood Pressure: 116 mmHg     Is BP treated: Yes     HDL Cholesterol: 33 mg/dL     Total Cholesterol: 187 mg/dL    A/P: Diabetes longstanding currently uncontrolled. Patient frequently self adjusts insulin . He is using his CGM and seems more motivated to manage sugar. I will change his Basaglar  to Toujeo  and continue 50 units daily to help with absorption. We will have him continue his current Humalog  dose and start taking ~15 minutes prior to eating.  -Continue Humalog  at current doses. Advised to take 15 minutes prior to eating.  -Stop Lantus .  -Start Toujeo  50 units daily. -Extensively discussed pathophysiology of diabetes, recommended lifestyle interventions, dietary effects on blood sugar control. -Counseled on s/sx of and management of hypoglycemia -Next A1C anticipated 06/2024  Written  patient instructions provided.  Total time in face to face counseling 30 minutes.   Follow up with me in 1 month.  [name] Fleeta Morris, PharmD, JAQUELINE, CPP Clinical Pharmacist [facility] [phone]

## 2024-05-04 ENCOUNTER — Other Ambulatory Visit: Payer: Self-pay

## 2024-05-07 ENCOUNTER — Other Ambulatory Visit: Payer: Self-pay

## 2024-05-09 ENCOUNTER — Encounter (HOSPITAL_BASED_OUTPATIENT_CLINIC_OR_DEPARTMENT_OTHER): Payer: Self-pay | Admitting: *Deleted

## 2024-05-09 ENCOUNTER — Other Ambulatory Visit: Payer: Self-pay

## 2024-05-09 ENCOUNTER — Emergency Department (HOSPITAL_BASED_OUTPATIENT_CLINIC_OR_DEPARTMENT_OTHER)

## 2024-05-09 ENCOUNTER — Emergency Department (HOSPITAL_BASED_OUTPATIENT_CLINIC_OR_DEPARTMENT_OTHER)
Admission: EM | Admit: 2024-05-09 | Discharge: 2024-05-09 | Discharge status: Against Medical Advice | Attending: Emergency Medicine | Admitting: Emergency Medicine

## 2024-05-09 DIAGNOSIS — Z5329 Procedure and treatment not carried out because of patient's decision for other reasons: Secondary | ICD-10-CM | POA: Diagnosis not present

## 2024-05-09 DIAGNOSIS — M7918 Myalgia, other site: Secondary | ICD-10-CM

## 2024-05-09 DIAGNOSIS — M79641 Pain in right hand: Secondary | ICD-10-CM | POA: Diagnosis not present

## 2024-05-09 DIAGNOSIS — M62838 Other muscle spasm: Secondary | ICD-10-CM | POA: Diagnosis not present

## 2024-05-09 DIAGNOSIS — Y92410 Unspecified street and highway as the place of occurrence of the external cause: Secondary | ICD-10-CM | POA: Insufficient documentation

## 2024-05-09 DIAGNOSIS — M542 Cervicalgia: Secondary | ICD-10-CM | POA: Insufficient documentation

## 2024-05-09 DIAGNOSIS — M25511 Pain in right shoulder: Secondary | ICD-10-CM | POA: Insufficient documentation

## 2024-05-09 DIAGNOSIS — E119 Type 2 diabetes mellitus without complications: Secondary | ICD-10-CM | POA: Insufficient documentation

## 2024-05-09 DIAGNOSIS — Z794 Long term (current) use of insulin: Secondary | ICD-10-CM | POA: Insufficient documentation

## 2024-05-09 DIAGNOSIS — M545 Low back pain, unspecified: Secondary | ICD-10-CM | POA: Insufficient documentation

## 2024-05-09 DIAGNOSIS — Z7982 Long term (current) use of aspirin: Secondary | ICD-10-CM | POA: Diagnosis not present

## 2024-05-09 MED ORDER — LIDOCAINE 5 % EX PTCH
1.0000 | MEDICATED_PATCH | CUTANEOUS | Status: DC
  Administered 2024-05-09: 1 via TRANSDERMAL
  Filled 2024-05-09: qty 1

## 2024-05-09 MED ORDER — CYCLOBENZAPRINE HCL 10 MG PO TABS
10.0000 mg | ORAL_TABLET | Freq: Two times a day (BID) | ORAL | 0 refills | Status: DC | PRN
  Filled 2024-05-09: qty 20, 10d supply, fill #0

## 2024-05-09 MED ORDER — ACETAMINOPHEN 500 MG PO TABS
1000.0000 mg | ORAL_TABLET | Freq: Once | ORAL | Status: AC
  Administered 2024-05-09: 1000 mg via ORAL
  Filled 2024-05-09: qty 2

## 2024-05-09 MED ORDER — KETOROLAC TROMETHAMINE 60 MG/2ML IM SOLN
30.0000 mg | Freq: Once | INTRAMUSCULAR | Status: AC
  Administered 2024-05-09: 30 mg via INTRAMUSCULAR
  Filled 2024-05-09: qty 2
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
## 2024-05-09 NOTE — Discharge Instructions (Signed)
 Recommend 1000 mg of Tylenol  every 6 hours as needed for pain.  Recommend buying over-the-counter lidocaine  patches for pain management as well.  I have prescribed you a muscle relaxant called cyclobenzaprine  to take as needed.  This medication is sedating so please be careful with its use.  Do not mix alcohol drugs or dangerous activities including driving.  Follow-up with your primary care doctor for further pain management.

## 2024-05-09 NOTE — ED Notes (Signed)
 Not in room, EDP informed
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
## 2024-05-18 ENCOUNTER — Other Ambulatory Visit: Payer: Self-pay

## 2024-05-28 ENCOUNTER — Ambulatory Visit: Payer: Self-pay

## 2024-05-28 ENCOUNTER — Inpatient Hospital Stay (HOSPITAL_BASED_OUTPATIENT_CLINIC_OR_DEPARTMENT_OTHER)
Admission: EM | Admit: 2024-05-28 | Discharge: 2024-06-02 | DRG: 637 | Disposition: A | Discharge status: Home / Self Care | Attending: Internal Medicine | Admitting: Internal Medicine

## 2024-05-28 ENCOUNTER — Other Ambulatory Visit: Payer: Self-pay

## 2024-05-28 ENCOUNTER — Emergency Department (HOSPITAL_BASED_OUTPATIENT_CLINIC_OR_DEPARTMENT_OTHER)

## 2024-05-28 ENCOUNTER — Encounter (HOSPITAL_BASED_OUTPATIENT_CLINIC_OR_DEPARTMENT_OTHER): Payer: Self-pay | Admitting: Emergency Medicine

## 2024-05-28 DIAGNOSIS — N179 Acute kidney failure, unspecified: Secondary | ICD-10-CM | POA: Diagnosis present

## 2024-05-28 DIAGNOSIS — E875 Hyperkalemia: Secondary | ICD-10-CM | POA: Diagnosis present

## 2024-05-28 DIAGNOSIS — Z794 Long term (current) use of insulin: Secondary | ICD-10-CM

## 2024-05-28 DIAGNOSIS — A419 Sepsis, unspecified organism: Secondary | ICD-10-CM | POA: Diagnosis not present

## 2024-05-28 DIAGNOSIS — J181 Lobar pneumonia, unspecified organism: Secondary | ICD-10-CM | POA: Diagnosis present

## 2024-05-28 DIAGNOSIS — N1832 Chronic kidney disease, stage 3b: Secondary | ICD-10-CM | POA: Diagnosis present

## 2024-05-28 DIAGNOSIS — E871 Hypo-osmolality and hyponatremia: Secondary | ICD-10-CM | POA: Diagnosis present

## 2024-05-28 DIAGNOSIS — R54 Age-related physical debility: Secondary | ICD-10-CM | POA: Diagnosis present

## 2024-05-28 DIAGNOSIS — J189 Pneumonia, unspecified organism: Secondary | ICD-10-CM | POA: Diagnosis present

## 2024-05-28 DIAGNOSIS — F17210 Nicotine dependence, cigarettes, uncomplicated: Secondary | ICD-10-CM | POA: Diagnosis present

## 2024-05-28 DIAGNOSIS — R739 Hyperglycemia, unspecified: Principal | ICD-10-CM

## 2024-05-28 DIAGNOSIS — E860 Dehydration: Secondary | ICD-10-CM | POA: Diagnosis present

## 2024-05-28 DIAGNOSIS — E782 Mixed hyperlipidemia: Secondary | ICD-10-CM | POA: Diagnosis present

## 2024-05-28 DIAGNOSIS — E669 Obesity, unspecified: Secondary | ICD-10-CM | POA: Diagnosis present

## 2024-05-28 DIAGNOSIS — R918 Other nonspecific abnormal finding of lung field: Secondary | ICD-10-CM | POA: Diagnosis not present

## 2024-05-28 DIAGNOSIS — Z79899 Other long term (current) drug therapy: Secondary | ICD-10-CM

## 2024-05-28 DIAGNOSIS — E1122 Type 2 diabetes mellitus with diabetic chronic kidney disease: Secondary | ICD-10-CM | POA: Diagnosis present

## 2024-05-28 DIAGNOSIS — Z1152 Encounter for screening for COVID-19: Secondary | ICD-10-CM

## 2024-05-28 DIAGNOSIS — K226 Gastro-esophageal laceration-hemorrhage syndrome: Secondary | ICD-10-CM | POA: Diagnosis present

## 2024-05-28 DIAGNOSIS — Z6830 Body mass index (BMI) 30.0-30.9, adult: Secondary | ICD-10-CM

## 2024-05-28 DIAGNOSIS — E1110 Type 2 diabetes mellitus with ketoacidosis without coma: Secondary | ICD-10-CM | POA: Diagnosis present

## 2024-05-28 DIAGNOSIS — R6520 Severe sepsis without septic shock: Secondary | ICD-10-CM | POA: Diagnosis not present

## 2024-05-28 DIAGNOSIS — K920 Hematemesis: Secondary | ICD-10-CM | POA: Insufficient documentation

## 2024-05-28 DIAGNOSIS — Z7982 Long term (current) use of aspirin: Secondary | ICD-10-CM

## 2024-05-28 DIAGNOSIS — R059 Cough, unspecified: Secondary | ICD-10-CM | POA: Diagnosis not present

## 2024-05-28 DIAGNOSIS — I129 Hypertensive chronic kidney disease with stage 1 through stage 4 chronic kidney disease, or unspecified chronic kidney disease: Secondary | ICD-10-CM | POA: Diagnosis present

## 2024-05-28 LAB — I-STAT VENOUS BLOOD GAS, ED
Acid-base deficit: 3 mmol/L — ABNORMAL HIGH (ref 0.0–2.0)
Bicarbonate: 22.4 mmol/L (ref 20.0–28.0)
Calcium, Ion: 1.09 mmol/L — ABNORMAL LOW (ref 1.15–1.40)
HCT: 39 % (ref 39.0–52.0)
Hemoglobin: 13.3 g/dL (ref 13.0–17.0)
O2 Saturation: 32 %
Patient temperature: 98.2
Potassium: 4.7 mmol/L (ref 3.5–5.1)
Sodium: 123 mmol/L — ABNORMAL LOW (ref 135–145)
TCO2: 24 mmol/L (ref 22–32)
pCO2, Ven: 39.4 mmHg — ABNORMAL LOW (ref 44–60)
pH, Ven: 7.363 (ref 7.25–7.43)
pO2, Ven: 20 mmHg — CL (ref 32–45)

## 2024-05-28 LAB — COMPREHENSIVE METABOLIC PANEL WITH GFR
ALT: 12 U/L (ref 0–44)
AST: 22 U/L (ref 15–41)
Albumin: 3.5 g/dL (ref 3.5–5.0)
Alkaline Phosphatase: 90 U/L (ref 38–126)
Anion gap: 24 — ABNORMAL HIGH (ref 5–15)
BUN: 37 mg/dL — ABNORMAL HIGH (ref 8–23)
CO2: 16 mmol/L — ABNORMAL LOW (ref 22–32)
Calcium: 9.2 mg/dL (ref 8.9–10.3)
Chloride: 84 mmol/L — ABNORMAL LOW (ref 98–111)
Creatinine, Ser: 1.67 mg/dL — ABNORMAL HIGH (ref 0.61–1.24)
GFR, Estimated: 46 mL/min — ABNORMAL LOW (ref >60)
Glucose, Bld: 459 mg/dL — ABNORMAL HIGH (ref 70–99)
Potassium: 5.2 mmol/L — ABNORMAL HIGH (ref 3.5–5.1)
Sodium: 124 mmol/L — ABNORMAL LOW (ref 135–145)
Total Bilirubin: 0.6 mg/dL (ref 0.0–1.2)
Total Protein: 7.8 g/dL (ref 6.5–8.1)

## 2024-05-28 LAB — CBC
HCT: 36.5 % — ABNORMAL LOW (ref 39.0–52.0)
Hemoglobin: 12.3 g/dL — ABNORMAL LOW (ref 13.0–17.0)
MCH: 28.7 pg (ref 26.0–34.0)
MCHC: 33.7 g/dL (ref 30.0–36.0)
MCV: 85.1 fL (ref 80.0–100.0)
Platelets: 160 K/uL (ref 150–400)
RBC: 4.29 MIL/uL (ref 4.22–5.81)
RDW: 12 % (ref 11.5–15.5)
WBC: 17.1 K/uL — ABNORMAL HIGH (ref 4.0–10.5)
nRBC: 0 % (ref 0.0–0.2)

## 2024-05-28 LAB — URINALYSIS, ROUTINE W REFLEX MICROSCOPIC
Bacteria, UA: NONE SEEN
Bilirubin Urine: NEGATIVE
Glucose, UA: >1000 mg/dL — AB
Ketones, ur: 40 mg/dL — AB
Leukocytes,Ua: NEGATIVE
Nitrite: NEGATIVE
Protein, ur: 30 mg/dL — AB
Specific Gravity, Urine: 1.021 (ref 1.005–1.030)
pH: 5 (ref 5.0–8.0)

## 2024-05-28 LAB — CBG MONITORING, ED
Glucose-Capillary: 227 mg/dL — ABNORMAL HIGH (ref 70–99)
Glucose-Capillary: 242 mg/dL — ABNORMAL HIGH (ref 70–99)
Glucose-Capillary: 311 mg/dL — ABNORMAL HIGH (ref 70–99)
Glucose-Capillary: 387 mg/dL — ABNORMAL HIGH (ref 70–99)
Glucose-Capillary: 413 mg/dL — ABNORMAL HIGH (ref 70–99)
Glucose-Capillary: 476 mg/dL — ABNORMAL HIGH (ref 70–99)
Glucose-Capillary: 479 mg/dL — ABNORMAL HIGH (ref 70–99)

## 2024-05-28 LAB — RESP PANEL BY RT-PCR (RSV, FLU A&B, COVID)  RVPGX2
Influenza A by PCR: NEGATIVE
Influenza B by PCR: NEGATIVE
Resp Syncytial Virus by PCR: NEGATIVE
SARS Coronavirus 2 by RT PCR: NEGATIVE

## 2024-05-28 LAB — BETA-HYDROXYBUTYRIC ACID: Beta-Hydroxybutyric Acid: 3.15 mmol/L — ABNORMAL HIGH (ref 0.05–0.27)

## 2024-05-28 LAB — LIPASE, BLOOD: Lipase: 17 U/L (ref 11–51)

## 2024-05-28 MED ORDER — ONDANSETRON HCL 4 MG/2ML IJ SOLN
4.0000 mg | Freq: Once | INTRAMUSCULAR | Status: AC
  Administered 2024-05-28: 16:00:00 4 mg via INTRAVENOUS
  Filled 2024-05-28: qty 2

## 2024-05-28 MED ORDER — INSULIN REGULAR(HUMAN) IN NACL 100-0.9 UT/100ML-% IV SOLN
INTRAVENOUS | Status: DC
  Administered 2024-05-28: 19:00:00 12 [IU]/h via INTRAVENOUS
  Filled 2024-05-28: qty 100

## 2024-05-28 MED ORDER — LACTATED RINGERS IV BOLUS
1000.0000 mL | Freq: Once | INTRAVENOUS | Status: AC
  Administered 2024-05-28: 15:00:00 1000 mL via INTRAVENOUS

## 2024-05-28 MED ORDER — LACTATED RINGERS IV SOLN: INTRAVENOUS | Status: DC

## 2024-05-28 MED ORDER — DEXTROSE 50 % IV SOLN: 0.0000 mL | INTRAVENOUS | Status: DC | PRN

## 2024-05-28 MED ORDER — SODIUM CHLORIDE 0.9 % IV SOLN
500.0000 mg | INTRAVENOUS | Status: DC
  Administered 2024-05-28: 20:00:00 500 mg via INTRAVENOUS
  Filled 2024-05-28: qty 5

## 2024-05-28 MED ORDER — FENTANYL CITRATE (PF) 50 MCG/ML IJ SOSY
50.0000 ug | PREFILLED_SYRINGE | INTRAMUSCULAR | Status: DC | PRN
  Administered 2024-05-28: 16:00:00 50 ug via INTRAVENOUS
  Filled 2024-05-28 (×2): qty 1

## 2024-05-28 MED ORDER — SODIUM CHLORIDE 0.9 % IV SOLN
1.0000 g | Freq: Once | INTRAVENOUS | Status: AC
  Administered 2024-05-28: 18:00:00 1 g via INTRAVENOUS
  Filled 2024-05-28: qty 10

## 2024-05-28 MED ORDER — IOHEXOL 300 MG/ML  SOLN
100.0000 mL | Freq: Once | INTRAMUSCULAR | Status: AC | PRN
  Administered 2024-05-28: 16:00:00 85 mL via INTRAVENOUS

## 2024-05-28 MED ORDER — SODIUM CHLORIDE 0.9 % IV BOLUS
1000.0000 mL | Freq: Once | INTRAVENOUS | Status: AC
  Administered 2024-05-28: 18:00:00 1000 mL via INTRAVENOUS

## 2024-05-28 MED ORDER — ACETAMINOPHEN 500 MG PO TABS
1000.0000 mg | ORAL_TABLET | Freq: Once | ORAL | Status: AC
  Administered 2024-05-28: 16:00:00 1000 mg via ORAL
  Filled 2024-05-28: qty 2

## 2024-05-28 MED ORDER — DEXTROSE IN LACTATED RINGERS 5 % IV SOLN: INTRAVENOUS | Status: DC

## 2024-05-28 MED ORDER — INSULIN ASPART PROT & ASPART (70-30 MIX) 100 UNIT/ML ~~LOC~~ SUSP
8.0000 [IU] | Freq: Once | SUBCUTANEOUS | Status: AC
  Administered 2024-05-28: 16:00:00 8 [IU] via SUBCUTANEOUS
  Filled 2024-05-28: qty 8

## 2024-05-28 NOTE — Telephone Encounter (Signed)
 FYI Only or Action Required?: FYI only for provider: ED advised.  Patient was last seen in primary care on 04/09/2024 by Theotis Haze ORN, NP.  Called Nurse Triage reporting Pain.  Symptoms began x 3 days.  Interventions attempted: Rest, hydration, or home remedies.  Symptoms are: gradually worsening.  Triage Disposition: Go to ED Now (or PCP Triage)  Patient/caregiver understands and will follow disposition?: Yes       Copied from CRM [id number]. Topic: Clinical - Red Word Triage >> May 28, 2024  8:43 AM Adelita E wrote: [facility] that prompted transfer to Nurse Triage: Cough, diarrhea, pain is located all over body. Patient rated pain 9 out of 10. Reason for Disposition  Patient sounds very sick or weak to the triager  Answer Assessment - Initial Assessment Questions 1. ONSET: When did the muscle aches or body pains start?        Patient called in to triage with reports of severe pain x 3 days. During triage, patient stated the pain was not in one particular area, but all over. Cough and diarrhea reported to the specialist, however patient sounded in distress during triage call. Referred to ED for severe pain, patient agrees with disposition, and will have someone drive him there now.  Protocols used: Muscle Aches and Body Pain-A-AH

## 2024-05-28 NOTE — ED Triage Notes (Signed)
 Abdo pain,  N/v/d/ x 3 days Only able to take water Reports some red tinged sputum ( clear in triage)

## 2024-05-28 NOTE — ED Provider Notes (Signed)
 [place] EMERGENCY DEPARTMENT AT [facility] Provider Note   CSN: [id number] Arrival date & time: 05/28/24  1131     Patient presents with: Abdominal Pain   Christian Navarro is a 63 y.o. male with history of type 2 diabetes on insulin , hyperlipidemia, hypertension, presents with concern for generalized abdominal pain and productive that started about 3 days ago.  Also reports intermittent nausea and nonbloody emesis.  He was able to get some small sips of water at home.  No flank pain.  Denies any recent sick contacts. No fever or chills.  Reports normal bowel movements with the last bowel movement being this morning.  He reports he did not take his insulin  this morning.    Abdominal Pain      Prior to Admission medications  Medication Sig Start Date End Date Taking? Authorizing Provider  Accu-Chek Softclix Lancets lancets Use to check blood sugar 3 times daily.Must have office visit for refills 12/05/23   Delbert Clam, MD  aspirin  EC 81 MG tablet Take 1 tablet (81 mg total) by mouth daily. 03/30/18   Danton Jon CHRISTELLA, PA-C  atorvastatin  (LIPITOR) 40 MG tablet Take 1 tablet (40 mg total) by mouth daily. 01/04/24   Fleming, Zelda W, NP  Blood Glucose Monitoring Suppl (ACCU-CHEK GUIDE ME) w/Device KIT Use to check blood sugar 3 times daily.Must have office visit for refills 12/05/23   Newlin, Enobong, MD  clotrimazole -betamethasone  (LOTRISONE ) cream Apply 1 Application topically daily. 07/04/23   Theotis Haze ORN, NP  Continuous Glucose Receiver (DEXCOM G7 RECEIVER) DEVI Check blood glucose levels continuously  E11.65 z79.84 01/04/24   Theotis Haze ORN, NP  Continuous Glucose Sensor (DEXCOM G7 SENSOR) MISC Check blood glucose levels continuously  E11.65 z79.84 01/06/24   Delbert Clam, MD  cyclobenzaprine  (FLEXERIL ) 10 MG tablet Take 1 tablet (10 mg total) by mouth 2 (two) times daily as needed for muscle spasms. 05/09/24   Curatolo, Adam, DO  fenofibrate  (TRICOR ) 145 MG tablet  Take 1 tablet (145 mg total) by mouth daily. 09/19/23   Newlin, Enobong, MD  gabapentin  (NEURONTIN ) 100 MG capsule Take 1 capsule (100 mg total) by mouth 3 (three) times daily. 07/04/23   Fleming, Zelda W, NP  glucose blood (ACCU-CHEK GUIDE TEST) test strip Use to check blood sugar 3 times daily. 02/28/24   Newlin, Enobong, MD  ibuprofen  (ADVIL ) 600 MG tablet Take 1 tablet (600 mg total) by mouth every 6 (six) hours as needed. 07/04/23   Fleming, Zelda W, NP  insulin  glargine, 1 Unit Dial , (TOUJEO  SOLOSTAR) 300 UNIT/ML Solostar Pen Inject 50 Units into the skin daily. 04/26/24   Newlin, Enobong, MD  insulin  lispro (HUMALOG ) 100 UNIT/ML KwikPen Inject 40 Units into the skin 2 (two) times daily. 04/26/24   Newlin, Enobong, MD  Insulin  Pen Needle (TRUEPLUS PEN NEEDLES) 31G X 5 MM MISC Use as directed to administer insulin  04/26/24   Delbert Clam, MD  lisinopril  (ZESTRIL ) 2.5 MG tablet Take 1 tablet (2.5 mg total) by mouth daily. 04/09/24   Fleming, Zelda W, NP  mupirocin  ointment (BACTROBAN ) 2 % Apply 1 Application topically 2 (two) times daily. folliculitis Patient not taking: Reported on 04/09/2024 03/08/23   Fleming, Zelda W, NP  nystatin -triamcinolone  ointment (MYCOLOG) Apply 1 Application topically 2 (two) times daily. 07/04/23   Fleming, Zelda W, NP  sildenafil  (VIAGRA ) 100 MG tablet Take 0.5-1 tablets (50-100 mg total) by mouth daily as needed for erectile dysfunction. 04/10/24   Fleming, Zelda W, NP  Vitamin  D, Ergocalciferol , (DRISDOL ) 1.25 MG (50000 UNIT) CAPS capsule Take 1 capsule (50,000 Units total) by mouth every 7 (seven) days. 04/10/24   Fleming, Zelda W, NP    Allergies: Patient has no known allergies.    Review of Systems  Gastrointestinal:  Positive for abdominal pain.    Updated Vital Signs BP 139/63   Pulse 88   Temp 98.9 F (37.2 C) (Oral)   Resp 20   Wt 83.9 kg   SpO2 95%   BMI 30.79 kg/m   Physical Exam Vitals and nursing note reviewed.  Constitutional:       General: He is not in acute distress.    Appearance: He is well-developed.     Comments: Curled on side in the stretcher, appears uncomfortable No active vomiting  HENT:     Head: Normocephalic and atraumatic.  Eyes:     Conjunctiva/sclera: Conjunctivae normal.  Cardiovascular:     Rate and Rhythm: Normal rate and regular rhythm.     Heart sounds: No murmur heard. Pulmonary:     Effort: Pulmonary effort is normal. No respiratory distress.     Breath sounds: Normal breath sounds.  Abdominal:     General: There is distension.     Palpations: Abdomen is soft.     Tenderness: There is abdominal tenderness.     Comments: Generalized abdominal tenderness, more in the upper abdomen than lower abdomen.  No rebound or guarding.  Normal bowel sounds.  Musculoskeletal:        General: No swelling.     Cervical back: Neck supple.  Skin:    General: Skin is warm and dry.     Capillary Refill: Capillary refill takes 2 to 3 seconds.  Neurological:     Mental Status: He is alert.  Psychiatric:        Mood and Affect: Mood normal.     (all labs ordered are listed, but only abnormal results are displayed) Labs Reviewed  COMPREHENSIVE METABOLIC PANEL WITH GFR - Abnormal; Notable for the following components:      Result Value   Sodium 124 (*)    Potassium 5.2 (*)    Chloride 84 (*)    CO2 16 (*)    Glucose, Bld 459 (*)    BUN 37 (*)    Creatinine, Ser 1.67 (*)    GFR, Estimated 46 (*)    Anion gap 24 (*)    All other components within normal limits  CBC - Abnormal; Notable for the following components:   WBC 17.1 (*)    Hemoglobin 12.3 (*)    HCT 36.5 (*)    All other components within normal limits  URINALYSIS, ROUTINE W REFLEX MICROSCOPIC - Abnormal; Notable for the following components:   Glucose, UA >1,000 (*)    Hgb urine dipstick SMALL (*)    Ketones, ur 40 (*)    Protein, ur 30 (*)    All other components within normal limits  I-STAT VENOUS BLOOD GAS, ED - Abnormal;  Notable for the following components:   pCO2, Ven 39.4 (*)    pO2, Ven 20 (*)    Acid-base deficit 3.0 (*)    Sodium 123 (*)    Calcium , Ion 1.09 (*)    All other components within normal limits  CBG MONITORING, ED - Abnormal; Notable for the following components:   Glucose-Capillary 476 (*)    All other components within normal limits  CBG MONITORING, ED - Abnormal; Notable for the following components:  Glucose-Capillary 479 (*)    All other components within normal limits  CBG MONITORING, ED - Abnormal; Notable for the following components:   Glucose-Capillary 413 (*)    All other components within normal limits  RESP PANEL BY RT-PCR (RSV, FLU A&B, COVID)  RVPGX2  LIPASE, BLOOD  BETA-HYDROXYBUTYRIC ACID    EKG: EKG Interpretation Date/Time:  Monday May 28 2024 15:57:03 EST Ventricular Rate:  94 PR Interval:  167 QRS Duration:  86 QT Interval:  377 QTC Calculation: 472 R Axis:   104  Text Interpretation: Sinus rhythm Right axis deviation Nonspecific ST abnormality Confirmed by Yolande Charleston [phone]) on 05/28/2024 4:00:27 PM  Radiology: DG Chest Portable 1 View Result Date: 05/28/2024 EXAM: 1 VIEW(S) XRAY OF THE CHEST 05/28/2024 06:24:00 PM COMPARISON: Partial comparison to CT abdomen and pelvis earlier today and CT chest dated 05/15/2018. CLINICAL HISTORY: cough FINDINGS: LUNGS AND PLEURA: Patchy/masslike opacities in the right lower lobe, favoring pneumonia when correlating with recent CT abdomen/pelvis, although incompletely visualized. Mild left basilar opacity atelectasis versus pneumonia. No pleural effusion. No pneumothorax. HEART AND MEDIASTINUM: No acute abnormality of the cardiac and mediastinal silhouettes. BONES AND SOFT TISSUES: No acute osseous abnormality. IMPRESSION: 1. Right lower lobe patchy/masslike opacities, favoring pneumonia on recent CT abdomen/pelvis, incompletely visualized. 2. Mild left basilar opacity, atelectasis versus pneumonia.  Electronically signed by: Pinkie Pebbles MD 05/28/2024 07:05 PM EST RP Workstation: [id number]   CT ABDOMEN PELVIS W CONTRAST Result Date: 05/28/2024 EXAM: CT ABDOMEN AND PELVIS WITH CONTRAST 05/28/2024 04:28:53 PM TECHNIQUE: CT of the abdomen and pelvis was performed with the administration of 85 mL of iohexol  (OMNIPAQUE ) 300 MG/ML solution. Multiplanar reformatted images are provided for review. Automated exposure control, iterative reconstruction, and/or weight-based adjustment of the mA/kV was utilized to reduce the radiation dose to as low as reasonably achievable. COMPARISON: None available. CLINICAL HISTORY: Abdominal pain, acute, nonlocalized. FINDINGS: LOWER CHEST: Peripheral airspace disease with air bronchograms in the right lower lobe most consistent with lobar pneumonia. LIVER: The liver is unremarkable. GALLBLADDER AND BILE DUCTS: Gallbladder is unremarkable. No biliary ductal dilatation. SPLEEN: No acute abnormality. PANCREAS: No acute abnormality. ADRENAL GLANDS: No acute abnormality. KIDNEYS, URETERS AND BLADDER: Nonobstructing calculi within the left kidney measure 1 mm and 4 mm. Similar small calculi which are nonobstructing in the right kidney. No ureteral lithiasis or obstructive uropathy. Duplicated collecting system on the right. There are bilateral simple fluid attenuation cysts on postcontrast imaging consistent with benign cysts. Per consensus, no follow-up is needed for simple Bosniak type 1 and 2 renal cysts, unless the patient has a malignancy history or risk factors. No perinephric or periureteral stranding. Urinary bladder is unremarkable. GI AND BOWEL: Stomach demonstrates no acute abnormality. There is no bowel obstruction. Normal appendix. PERITONEUM AND RETROPERITONEUM: No ascites. No free air. VASCULATURE: Aorta is normal in caliber. LYMPH NODES: No lymphadenopathy. REPRODUCTIVE ORGANS: No acute abnormality. BONES AND SOFT TISSUES: No acute osseous abnormality. No focal soft  tissue abnormality. IMPRESSION: 1. Right lower lobe lobar pneumonia. 2. Nonobstructing bilateral nephrolithiasis without ureteral calculi or obstructive uropathy. Electronically signed by: Norleen Boxer MD 05/28/2024 05:22 PM EST RP Workstation: [id number]     .Critical Care  Performed by: Veta Palma, PA-C Authorized by: Veta Palma, PA-C   Critical care provider statement:    Critical care time (minutes):  32   Critical care was necessary to treat or prevent imminent or life-threatening deterioration of the following conditions:  Endocrine crisis (DKA)   Critical care was time spent personally  by me on the following activities:  Development of treatment plan with patient or surrogate, discussions with consultants, evaluation of patient's response to treatment, examination of patient, ordering and review of laboratory studies, ordering and review of radiographic studies, ordering and performing treatments and interventions, pulse oximetry, re-evaluation of patient's condition, review of old charts and obtaining history from patient or surrogate   Care discussed with: admitting provider      Medications Ordered in the ED  fentaNYL  (SUBLIMAZE ) injection 50 mcg (50 mcg Intravenous Given 05/28/24 1540)  insulin  regular, human (MYXREDLIN ) 100 units/ 100 mL infusion (11 Units/hr Intravenous Rate/Dose Change 05/28/24 1919)  lactated ringers  infusion ( Intravenous New Bag/Given 05/28/24 1845)  dextrose  5 % in lactated ringers  infusion (0 mLs Intravenous Hold 05/28/24 1916)  dextrose  50 % solution 0-50 mL (has no administration in time range)  azithromycin  (ZITHROMAX ) 500 mg in sodium chloride  0.9 % 250 mL IVPB (has no administration in time range)  lactated ringers  bolus 1,000 mL (0 mLs Intravenous Stopped 05/28/24 1624)  acetaminophen  (TYLENOL ) tablet 1,000 mg (1,000 mg Oral Given 05/28/24 1540)  ondansetron  (ZOFRAN ) injection 4 mg (4 mg Intravenous Given 05/28/24 1539)  insulin  aspart  protamine- aspart (NOVOLOG  MIX 70/30) injection 8 Units (8 Units Subcutaneous Given 05/28/24 1619)  iohexol  (OMNIPAQUE ) 300 MG/ML solution 100 mL (85 mLs Intravenous Contrast Given 05/28/24 1623)  sodium chloride  0.9 % bolus 1,000 mL (0 mLs Intravenous Stopped 05/28/24 1836)  cefTRIAXone  (ROCEPHIN ) 1 g in sodium chloride  0.9 % 100 mL IVPB (0 g Intravenous Stopped 05/28/24 1818)    Clinical Course as of 05/28/24 2005  Mon May 28, 2024  1805 Patient was given SubQ 8 units insulin  aspart without improvement in blood sugars.  Will initiate insulin  drip. [AF]  1850 Consulted with Dr. Dorinda who recommends admission [AF]    Clinical Course User Index [AF] Veta Palma, PA-C                                 Medical Decision Making Amount and/or Complexity of Data Reviewed Labs: ordered. Radiology: ordered.  Risk OTC drugs. Prescription drug management. Decision regarding hospitalization.     Differential diagnosis includes but is not limited to  acute cholecystitis, cholelithiasis, cholangitis, choledocholithiasis, peptic ulcer, gastritis, gastroenteritis, appendicitis, IBS, IBD, DKA, HHS, hyperglycemia, nephrolithiasis, UTI, pyelonephritis, pancreatitis, diverticulitis, mesenteric ischemia, abdominal aortic aneurysm   ED Course:  Upon initial evaluation, patient is uncomfortable appearing, but no acute distress.  Reporting nausea, but no active vomiting.  Does appear mildly dehydrated on exam with cap refill about 2 to 3 seconds mucous membranes dry appearing.  Postvoid residual 0 with bladder ultrasound, no concern for acute urinary retention at this time.  Labs Ordered: I Ordered, and personally interpreted labs.  The pertinent results include:   CBC with leukocytosis of 17.1.  Hemoglobin 12.3 CMP with hyponatremia at 124, when corrected for glucose is 133.  Hyperglycemia at 459.  Slightly hyperkalemic at 5.2.  Creatinine elevated at 1.67 up from 1.17.  GFR 46. Anion gap at  24 Lipase within normal limits Urinalysis with high glucose, ketones, and protein present.  No signs of infection VBG with normal pH.  pCO2 low at 39. Normal bicarb COVID, flu, RSV negative  Imaging Studies ordered: I ordered imaging studies including CT abdomen pelvis, chest x-ray I independently visualized the imaging with scope of interpretation limited to determining acute life threatening conditions related to emergency care. Imaging  showed   CT abdomen and pelvis: IMPRESSION:  1. Right lower lobe lobar pneumonia.  2. Nonobstructing bilateral nephrolithiasis without ureteral calculi or  obstructive uropathy.    Chest x-ray: IMPRESSION:  1. Right lower lobe patchy/masslike opacities, favoring pneumonia on recent CT  abdomen/pelvis, incompletely visualized.  2. Mild left basilar opacity, atelectasis versus pneumonia.   I agree with the radiologist interpretation   Cardiac Monitoring: / EKG: The patient was maintained on a cardiac monitor.  I personally viewed and interpreted the cardiac monitored which showed an underlying rhythm of: Normal sinus rhythm   Consultations Obtained: I requested consultation with the hospitalist Dr. Lou,  and discussed lab and imaging findings as well as pertinent plan - they recommend: Admission  Medications Given: Tylenol  Fentanyl  Ceftriaxone  Azithromycin  1L NS bolus 1L LR bolus LR maintenance fluids 8 units of insulin  aspart Insulin  drip  Upon re-evaluation, patient remains well-appearing with stable vitals.  His CT shows a right lower lobe pneumonia that is likely the source of his cough.  His COVID, flu, and RSV testing is negative.  He was started on ceftriaxone  and azithromycin  for this. CT abdomen pelvis did show benign renal cysts.  No other acute abnormalities to explain patient's abdominal pain.  Feel his abdominal pain is likely secondary to his nausea and vomiting from his elevated blood sugars. Glucose 459 upon arrival.   Anion gap at 24.  No acidosis on VBG.  He does have ketones in his urine. Question DKA. Began treatment for hyperglycemia with subcutaneous insulin  aspart.  However, upon recheck of blood sugar, CBG still remained at 476.  Will initiate insulin  drip.  He also was found to have an AKI with creatinine 1.67 up from 1.17. He was given 2 L of IV fluids and started on maintenance fluids.    Patient will need admission regarding his pneumonia and elevated glucose as he is requiring insulin  drip and IV fluids.    Impression: Hyperglycemia AKI Pneumonia  Disposition:  Admission with hospitalist Dr. Lou    Record Review: External records from outside source obtained and reviewed including visits with pharmacy and PCP regarding his management of diabetes.  They mention occasional noncompliance with his insulin  regimen     This chart was dictated using voice recognition software, Dragon. Despite the best efforts of this provider to proofread and correct errors, errors may still occur which can change documentation meaning.       Final diagnoses:  Hyperglycemia  Pneumonia of right lower lobe due to infectious organism  AKI (acute kidney injury)    ED Discharge Orders     None          Veta Palma, DEVONNA 05/28/24 2005    Yolande Lamar BROCKS, MD 05/29/24 [phone]

## 2024-05-28 NOTE — Progress Notes (Signed)
 Plan of Care Note for accepted transfer   Patient: Christian Navarro MRN: [id number]   DOA: 05/28/2024  Facility requesting transfer: [facility] Requesting Provider: Bari Roxie CHRISTELLA, DO; Veta Palma, PA-C Reason for transfer: DKA, community-acquired pneumonia Facility course: Patient with a history of insulin -dependent diabetes, HTN, HLD and cervical radiculopathy presented to the [facility] for evaluation of generalized abdominal pain, nausea, vomiting and productive cough.  Vitals overall stable.  Significant labs include sodium 124, K+ 5.2, bicarb 16, glucose 459, BUN/creatinine 37/1.67, anion gap 24, WBC 17.1, Hgb 12.3, VBG shows normal pH, negative flu, RSV and COVID test, UA shows significant glucosuria and mild ketonuria and hemoglobinuria but no signs of infection, BHB pending  EKG shows sinus rhythm with nonspecific ST changes. CXR shows right lower lobe patchy/masslike opacities favoring pneumonia. CT A/P with no acute intra-abdominal pathology but shows right lower lobe lobar pneumonia.  Patient received Tylenol  1 g, IVF 1 L bolus x 2, IV Rocephin  and NovoLog  Mix 70/30 8 units x 1. Patient was then placed on insulin  drip and [facility] was consulted for admission.  I have ordered IV azithromycin  for complete treatment of his pneumonia  Plan of care: The patient is accepted for admission to Progressive unit, at [facility]  Admitting to progressive bed for management of DKA and community-acquired pneumonia  Author: Claretta CHRISTELLA Alderman, MD 05/28/2024  Check [email or web address] for on-call coverage.  Nursing staff, Please call [facility] Admits & Consults System-Wide number on Amion as soon as patient's arrival, so appropriate admitting provider can evaluate the pt.

## 2024-05-28 NOTE — Telephone Encounter (Signed)
He needs to go to the emergency room. 

## 2024-05-29 ENCOUNTER — Ambulatory Visit: Payer: Self-pay | Admitting: Internal Medicine

## 2024-05-29 DIAGNOSIS — E875 Hyperkalemia: Secondary | ICD-10-CM | POA: Diagnosis not present

## 2024-05-29 DIAGNOSIS — E1122 Type 2 diabetes mellitus with diabetic chronic kidney disease: Secondary | ICD-10-CM | POA: Diagnosis present

## 2024-05-29 DIAGNOSIS — K920 Hematemesis: Secondary | ICD-10-CM | POA: Diagnosis not present

## 2024-05-29 DIAGNOSIS — E782 Mixed hyperlipidemia: Secondary | ICD-10-CM | POA: Diagnosis present

## 2024-05-29 DIAGNOSIS — Z1152 Encounter for screening for COVID-19: Secondary | ICD-10-CM | POA: Diagnosis not present

## 2024-05-29 DIAGNOSIS — N1832 Chronic kidney disease, stage 3b: Secondary | ICD-10-CM | POA: Diagnosis present

## 2024-05-29 DIAGNOSIS — R6520 Severe sepsis without septic shock: Secondary | ICD-10-CM | POA: Diagnosis not present

## 2024-05-29 DIAGNOSIS — Z794 Long term (current) use of insulin: Secondary | ICD-10-CM | POA: Diagnosis not present

## 2024-05-29 DIAGNOSIS — Z6830 Body mass index (BMI) 30.0-30.9, adult: Secondary | ICD-10-CM | POA: Diagnosis not present

## 2024-05-29 DIAGNOSIS — Z7982 Long term (current) use of aspirin: Secondary | ICD-10-CM | POA: Diagnosis not present

## 2024-05-29 DIAGNOSIS — K226 Gastro-esophageal laceration-hemorrhage syndrome: Secondary | ICD-10-CM | POA: Diagnosis present

## 2024-05-29 DIAGNOSIS — E871 Hypo-osmolality and hyponatremia: Secondary | ICD-10-CM | POA: Insufficient documentation

## 2024-05-29 DIAGNOSIS — E669 Obesity, unspecified: Secondary | ICD-10-CM | POA: Diagnosis present

## 2024-05-29 DIAGNOSIS — R54 Age-related physical debility: Secondary | ICD-10-CM | POA: Diagnosis present

## 2024-05-29 DIAGNOSIS — F17210 Nicotine dependence, cigarettes, uncomplicated: Secondary | ICD-10-CM | POA: Diagnosis present

## 2024-05-29 DIAGNOSIS — Z79899 Other long term (current) drug therapy: Secondary | ICD-10-CM | POA: Diagnosis not present

## 2024-05-29 DIAGNOSIS — A419 Sepsis, unspecified organism: Secondary | ICD-10-CM | POA: Diagnosis not present

## 2024-05-29 DIAGNOSIS — E860 Dehydration: Secondary | ICD-10-CM | POA: Diagnosis present

## 2024-05-29 DIAGNOSIS — N179 Acute kidney failure, unspecified: Secondary | ICD-10-CM

## 2024-05-29 DIAGNOSIS — E1110 Type 2 diabetes mellitus with ketoacidosis without coma: Secondary | ICD-10-CM | POA: Diagnosis not present

## 2024-05-29 DIAGNOSIS — J189 Pneumonia, unspecified organism: Secondary | ICD-10-CM | POA: Diagnosis present

## 2024-05-29 DIAGNOSIS — J181 Lobar pneumonia, unspecified organism: Secondary | ICD-10-CM | POA: Diagnosis not present

## 2024-05-29 DIAGNOSIS — I129 Hypertensive chronic kidney disease with stage 1 through stage 4 chronic kidney disease, or unspecified chronic kidney disease: Secondary | ICD-10-CM | POA: Diagnosis present

## 2024-05-29 LAB — CBG MONITORING, ED
Glucose-Capillary: 157 mg/dL — ABNORMAL HIGH (ref 70–99)
Glucose-Capillary: 175 mg/dL — ABNORMAL HIGH (ref 70–99)
Glucose-Capillary: 178 mg/dL — ABNORMAL HIGH (ref 70–99)
Glucose-Capillary: 224 mg/dL — ABNORMAL HIGH (ref 70–99)
Glucose-Capillary: 310 mg/dL — ABNORMAL HIGH (ref 70–99)

## 2024-05-29 LAB — CBC WITH DIFFERENTIAL/PLATELET
Abs Immature Granulocytes: 0.09 K/uL — ABNORMAL HIGH (ref 0.00–0.07)
Basophils Absolute: 0 K/uL (ref 0.0–0.1)
Basophils Relative: 0 %
Eosinophils Absolute: 0 K/uL (ref 0.0–0.5)
Eosinophils Relative: 0 %
HCT: 32.7 % — ABNORMAL LOW (ref 39.0–52.0)
Hemoglobin: 11.2 g/dL — ABNORMAL LOW (ref 13.0–17.0)
Immature Granulocytes: 1 %
Lymphocytes Relative: 11 %
Lymphs Abs: 1.4 K/uL (ref 0.7–4.0)
MCH: 29.8 pg (ref 26.0–34.0)
MCHC: 34.3 g/dL (ref 30.0–36.0)
MCV: 87 fL (ref 80.0–100.0)
Monocytes Absolute: 1.1 K/uL — ABNORMAL HIGH (ref 0.1–1.0)
Monocytes Relative: 8 %
Neutro Abs: 10.7 K/uL — ABNORMAL HIGH (ref 1.7–7.7)
Neutrophils Relative %: 80 %
Platelets: 185 K/uL (ref 150–400)
RBC: 3.76 MIL/uL — ABNORMAL LOW (ref 4.22–5.81)
RDW: 12.3 % (ref 11.5–15.5)
WBC: 13.2 K/uL — ABNORMAL HIGH (ref 4.0–10.5)
nRBC: 0 % (ref 0.0–0.2)

## 2024-05-29 LAB — C-REACTIVE PROTEIN: CRP: 34.4 mg/dL — ABNORMAL HIGH (ref <1.0)

## 2024-05-29 LAB — BASIC METABOLIC PANEL WITH GFR
Anion gap: 11 (ref 5–15)
Anion gap: 20 — ABNORMAL HIGH (ref 5–15)
BUN: 39 mg/dL — ABNORMAL HIGH (ref 8–23)
BUN: 50 mg/dL — ABNORMAL HIGH (ref 8–23)
CO2: 24 mmol/L (ref 22–32)
CO2: 17 mmol/L — ABNORMAL LOW (ref 22–32)
Calcium: 8.5 mg/dL — ABNORMAL LOW (ref 8.9–10.3)
Calcium: 8.2 mg/dL — ABNORMAL LOW (ref 8.9–10.3)
Chloride: 94 mmol/L — ABNORMAL LOW (ref 98–111)
Chloride: 91 mmol/L — ABNORMAL LOW (ref 98–111)
Creatinine, Ser: 1.6 mg/dL — ABNORMAL HIGH (ref 0.61–1.24)
Creatinine, Ser: 2.5 mg/dL — ABNORMAL HIGH (ref 0.61–1.24)
GFR, Estimated: 48 mL/min — ABNORMAL LOW (ref >60)
GFR, Estimated: 28 mL/min — ABNORMAL LOW (ref >60)
Glucose, Bld: 164 mg/dL — ABNORMAL HIGH (ref 70–99)
Glucose, Bld: 372 mg/dL — ABNORMAL HIGH (ref 70–99)
Potassium: 3.9 mmol/L (ref 3.5–5.1)
Potassium: 4.3 mmol/L (ref 3.5–5.1)
Sodium: 129 mmol/L — ABNORMAL LOW (ref 135–145)
Sodium: 127 mmol/L — ABNORMAL LOW (ref 135–145)

## 2024-05-29 LAB — PHOSPHORUS: Phosphorus: 3.5 mg/dL (ref 2.5–4.6)

## 2024-05-29 LAB — TYPE AND SCREEN
ABO/RH(D): O POS
Antibody Screen: NEGATIVE

## 2024-05-29 LAB — GLUCOSE, CAPILLARY
Glucose-Capillary: 370 mg/dL — ABNORMAL HIGH (ref 70–99)
Glucose-Capillary: 384 mg/dL — ABNORMAL HIGH (ref 70–99)
Glucose-Capillary: 342 mg/dL — ABNORMAL HIGH (ref 70–99)
Glucose-Capillary: 313 mg/dL — ABNORMAL HIGH (ref 70–99)
Glucose-Capillary: 272 mg/dL — ABNORMAL HIGH (ref 70–99)
Glucose-Capillary: 216 mg/dL — ABNORMAL HIGH (ref 70–99)

## 2024-05-29 LAB — MAGNESIUM: Magnesium: 2.2 mg/dL (ref 1.7–2.4)

## 2024-05-29 LAB — MRSA NEXT GEN BY PCR, NASAL: MRSA by PCR Next Gen: NOT DETECTED

## 2024-05-29 LAB — HEMOGLOBIN A1C
Hgb A1c MFr Bld: 11 % — ABNORMAL HIGH (ref 4.8–5.6)
Mean Plasma Glucose: 269 mg/dL

## 2024-05-29 LAB — LACTIC ACID, PLASMA
Lactic Acid, Venous: 1.2 mmol/L (ref 0.5–1.9)
Lactic Acid, Venous: 1.5 mmol/L (ref 0.5–1.9)
Lactic Acid, Venous: 1.5 mmol/L (ref 0.5–1.9)

## 2024-05-29 LAB — PROCALCITONIN: Procalcitonin: 2.91 ng/mL

## 2024-05-29 MED ORDER — ACETAMINOPHEN 325 MG PO TABS
650.0000 mg | ORAL_TABLET | Freq: Once | ORAL | Status: AC
  Administered 2024-05-29: 03:00:00 650 mg via ORAL
  Filled 2024-05-29: qty 2

## 2024-05-29 MED ORDER — ACETAMINOPHEN 500 MG PO TABS: 1000.0000 mg | ORAL_TABLET | Freq: Four times a day (QID) | ORAL | Status: DC | PRN

## 2024-05-29 MED ORDER — POTASSIUM CHLORIDE CRYS ER 20 MEQ PO TBCR
20.0000 meq | EXTENDED_RELEASE_TABLET | Freq: Once | ORAL | Status: AC
  Administered 2024-05-29: 18:00:00 20 meq via ORAL
  Filled 2024-05-29: qty 1

## 2024-05-29 MED ORDER — GABAPENTIN 100 MG PO CAPS
100.0000 mg | ORAL_CAPSULE | Freq: Three times a day (TID) | ORAL | Status: DC
  Administered 2024-05-29 – 2024-06-02 (×12): 100 mg via ORAL
  Filled 2024-05-29 (×12): qty 1

## 2024-05-29 MED ORDER — INSULIN ASPART 100 UNIT/ML IJ SOLN: 0.0000 [IU] | Freq: Every day | INTRAMUSCULAR | Status: DC

## 2024-05-29 MED ORDER — ACETAMINOPHEN 650 MG RE SUPP: 650.0000 mg | Freq: Four times a day (QID) | RECTAL | Status: DC | PRN

## 2024-05-29 MED ORDER — LACTATED RINGERS IV SOLN: INTRAVENOUS | Status: DC

## 2024-05-29 MED ORDER — POTASSIUM CHLORIDE 10 MEQ/100ML IV SOLN
10.0000 meq | INTRAVENOUS | Status: DC
  Administered 2024-05-29: 20:00:00 10 meq via INTRAVENOUS
  Filled 2024-05-29: qty 100

## 2024-05-29 MED ORDER — INSULIN GLARGINE-YFGN 100 UNIT/ML ~~LOC~~ SOLN: 40.0000 [IU] | Freq: Every day | SUBCUTANEOUS | Status: DC

## 2024-05-29 MED ORDER — SODIUM CHLORIDE 0.9 % IV SOLN
2.0000 g | INTRAVENOUS | Status: DC
  Administered 2024-05-29: 13:00:00 2 g via INTRAVENOUS

## 2024-05-29 MED ORDER — IPRATROPIUM-ALBUTEROL 0.5-2.5 (3) MG/3ML IN SOLN
3.0000 mL | Freq: Four times a day (QID) | RESPIRATORY_TRACT | Status: DC
  Administered 2024-05-29 – 2024-05-30 (×2): 3 mL via RESPIRATORY_TRACT
  Filled 2024-05-29 (×2): qty 3

## 2024-05-29 MED ORDER — LACTATED RINGERS IV BOLUS
1000.0000 mL | Freq: Once | INTRAVENOUS | Status: AC
  Administered 2024-05-29: 17:00:00 1000 mL via INTRAVENOUS

## 2024-05-29 MED ORDER — INSULIN ASPART 100 UNIT/ML IJ SOLN
4.0000 [IU] | Freq: Three times a day (TID) | INTRAMUSCULAR | Status: DC
  Filled 2024-05-29: qty 4

## 2024-05-29 MED ORDER — INSULIN GLARGINE 100 UNIT/ML ~~LOC~~ SOLN
40.0000 [IU] | Freq: Every day | SUBCUTANEOUS | Status: DC
  Administered 2024-05-29: 17:00:00 40 [IU] via SUBCUTANEOUS
  Filled 2024-05-29 (×2): qty 0.4

## 2024-05-29 MED ORDER — NICOTINE 21 MG/24HR TD PT24
21.0000 mg | MEDICATED_PATCH | Freq: Every day | TRANSDERMAL | Status: DC
  Administered 2024-05-29 – 2024-06-02 (×5): 21 mg via TRANSDERMAL
  Filled 2024-05-29 (×5): qty 1

## 2024-05-29 MED ORDER — INSULIN REGULAR(HUMAN) IN NACL 100-0.9 UT/100ML-% IV SOLN
INTRAVENOUS | Status: DC
  Administered 2024-05-29: 19:00:00 10.5 [IU]/h via INTRAVENOUS
  Filled 2024-05-29: qty 100

## 2024-05-29 MED ORDER — AZITHROMYCIN 500 MG PO TABS
500.0000 mg | ORAL_TABLET | Freq: Every day | ORAL | Status: DC
  Administered 2024-05-29: 12:00:00 500 mg via ORAL
  Filled 2024-05-29: qty 2

## 2024-05-29 MED ORDER — DEXTROSE IN LACTATED RINGERS 5 % IV SOLN: INTRAVENOUS | Status: DC

## 2024-05-29 MED ORDER — AZITHROMYCIN 500 MG PO TABS
500.0000 mg | ORAL_TABLET | Freq: Every day | ORAL | Status: DC
  Administered 2024-05-30 – 2024-06-02 (×4): 500 mg via ORAL
  Filled 2024-05-29 (×4): qty 1

## 2024-05-29 MED ORDER — INSULIN ASPART 100 UNIT/ML IJ SOLN
0.0000 [IU] | Freq: Three times a day (TID) | INTRAMUSCULAR | Status: DC
  Administered 2024-05-29: 17:00:00 15 [IU] via SUBCUTANEOUS

## 2024-05-29 MED ORDER — GUAIFENESIN-DM 100-10 MG/5ML PO SYRP
15.0000 mL | ORAL_SOLUTION | ORAL | Status: DC | PRN
  Administered 2024-05-29 – 2024-06-02 (×7): 15 mL via ORAL
  Filled 2024-05-29 (×8): qty 15

## 2024-05-29 MED ORDER — ASPIRIN 81 MG PO TBEC: 81.0000 mg | DELAYED_RELEASE_TABLET | Freq: Every day | ORAL | Status: DC

## 2024-05-29 MED ORDER — PANTOPRAZOLE SODIUM 40 MG IV SOLR
40.0000 mg | Freq: Two times a day (BID) | INTRAVENOUS | Status: DC
  Administered 2024-05-29 – 2024-06-01 (×8): 40 mg via INTRAVENOUS
  Filled 2024-05-29 (×9): qty 10

## 2024-05-29 MED ORDER — FENOFIBRATE 160 MG PO TABS
160.0000 mg | ORAL_TABLET | Freq: Every day | ORAL | Status: DC
  Administered 2024-05-30 – 2024-06-02 (×4): 160 mg via ORAL
  Filled 2024-05-29 (×4): qty 1

## 2024-05-29 MED ORDER — ATORVASTATIN CALCIUM 40 MG PO TABS
40.0000 mg | ORAL_TABLET | Freq: Every day | ORAL | Status: DC
  Administered 2024-05-29 – 2024-06-02 (×5): 40 mg via ORAL
  Filled 2024-05-29 (×5): qty 1

## 2024-05-29 MED ORDER — SODIUM CHLORIDE 0.9 % IV SOLN: 2.0000 g | INTRAVENOUS | Status: DC

## 2024-05-29 MED ORDER — POTASSIUM CHLORIDE CRYS ER 20 MEQ PO TBCR
20.0000 meq | EXTENDED_RELEASE_TABLET | Freq: Once | ORAL | Status: AC
  Administered 2024-05-29: 21:00:00 20 meq via ORAL
  Filled 2024-05-29: qty 1

## 2024-05-29 NOTE — Assessment & Plan Note (Signed)
 Due to DKA improved after hydration

## 2024-05-29 NOTE — Assessment & Plan Note (Signed)
 CKD stage IIIb baseline creatinine appears to be around 1.2, renal function significantly worse, continue hydration, check renal ultrasound, avoid nephrotoxins and monitor

## 2024-05-29 NOTE — Assessment & Plan Note (Signed)
 Due to DKA resolved.

## 2024-05-29 NOTE — Plan of Care (Signed)
   Problem: Education: Goal: Knowledge of General Education information will improve Description: Including pain rating scale, medication(s)/side effects and non-pharmacologic comfort measures Outcome: Progressing   Problem: Health Behavior/Discharge Planning: Goal: Ability to manage health-related needs will improve Outcome: Progressing   Problem: Clinical Measurements: Goal: Ability to maintain clinical measurements within normal limits will improve Outcome: Progressing Goal: Diagnostic test results will improve Outcome: Progressing   Problem: Nutrition: Goal: Adequate nutrition will be maintained Outcome: Progressing

## 2024-05-29 NOTE — Progress Notes (Addendum)
 Patient was seen and examined, frail, ill-appearing, possibly  back in DKA, will repeat stat labs, will give IV bolus, novolog  and lantus  ordered. Brayton Arlett Goold MD

## 2024-05-29 NOTE — Progress Notes (Signed)
 [facility] night cross cover note:   I was notified by the patient's RN that the patient is unable to tolerate discomfort associated with potassium infusion relating to his current order for potassium chloride  20 mill equivalents IV over 2 hours, even after reduction and rate of associated infusion.  Consequently, discontinued existing order for potassium chloride  20 mEq IV and replaced this with an order for potassium chloride  20 mill equivalents p.o. x 1 dose now.     Eva Pore, DO Hospitalist

## 2024-05-29 NOTE — Progress Notes (Signed)
 Patient received in the unit. A/O x 4 . Skin check done with RN kennedy. Admission team notified. Call bell within reach. MD at the bedside  05/29/24 1624  Vitals  Temp 98.8 F (37.1 C)  Temp Source Oral  BP 131/61  MAP (mmHg) 82  BP Location Right Arm  BP Method Automatic  Patient Position (if appropriate) Lying  Pulse Rate 91  Pulse Rate Source Monitor  Resp 20  MEWS COLOR  MEWS Score Color Green  Oxygen Therapy  SpO2 99 %  O2 Device Room Air  Pain Assessment  Pain Scale 0-10  Pain Score 0  MEWS Score  MEWS Temp 0  MEWS Systolic 0  MEWS Pulse 0  MEWS RR 0  MEWS LOC 0  MEWS Score 0

## 2024-05-29 NOTE — Assessment & Plan Note (Addendum)
 3 small Mallory-Weiss tear, multiple episodes of nausea vomiting at home, none here, on IV PPI monitor.

## 2024-05-29 NOTE — Hospital Course (Signed)
 CC: N/V/abd pain HPI: 63 year old African male with a history of diabetes, hypertension, mixed hyperlipidemia, tobacco abuse, presents to the ER with a 4 to 5-day history of nausea, vomiting and abdominal pain.  He states he has not eaten anything in the last 3 to 5 days.  He has had some nausea.  Occasional diarrhea.  He thinks he may have seen some blood in his vomit.  He has been febrile at home and coughing.  Due to his inability to eat or drink anything, he has not taken his medications in several days.  Around the ER, temp 98.8 heart rate 90 blood pressure 133/58 satting 99% on room air.  Lab work showed a white count of 17.1, hemoglobin of 12.3, platelets of 160  Sodium 124, potassium 5.2, bicarb is 16, BUN of 37, creatinine 1.67, glucose of 459  Calcium  9.2, total protein 7.8, albumin 3.5, AST 22, ALT of 12, alk phos of 90, total bili 0.6, anion gap of 24  Lipase is 17  Urinalysis showed specific gravity 1.021, pH of 5, glucose greater than thousand, ketones 40, protein 30, nitrate negative, leukocyte esterase negative  Influenza A, COVID, RSV were negative.  Beta hydroxybutyric acid was elevated at 3.15.  Venous i-STAT blood gas showed pH of 7.36 pCO2 39, pO2 of 20  Patient diagnosed with DKA and started on an insulin  drip.  [facility] consulted for admission.  Due to overwhelming patient volumes at all [place] system hospitals during current respiratory season, the patient has been in the ER since   Yesterday afternoon.    He remained on IV insulin  overnight.  By this morning, his anion gap closed to 11, bicarbonate up to 24 and his insulin  drip was discontinued.  CT scan abdomen pelvis demonstrated right lower lobe lobar pneumonia  Chest x-ray showed right lower lobe pneumonia  Significant Events: Admitted 05/28/2024 DKA, right lower lobe pneumonia, sepsis with acute organ   Admission Labs: white count of 17.1, hemoglobin of 12.3, platelets of 160 Sodium  124, potassium 5.2, bicarb is 16, BUN of 37, creatinine 1.67, glucose of 459 Calcium  9.2, total protein 7.8, albumin 3.5, AST 22, ALT of 12, alk phos of 90, total bili 0.6, anion gap of 24 Lipase is 17 Urinalysis showed specific gravity 1.021, pH of 5, glucose greater than thousand, ketones 40, protein 30, nitrate negative, leukocyte esterase negative Influenza A, COVID, RSV were negative Beta hydroxybutyric acid was elevated at 3.15. Venous i-STAT blood gas showed pH of 7.36 pCO2 39, pO2 of 20  Admission Imaging Studies: CT scan abdomen pelvis demonstrated right lower lobe lobar pneumonia Chest x-ray showed right lower lobe pneumonia  Significant Labs:   Significant Imaging Studies:   Antibiotic Therapy: Anti-infectives (From admission, onward)    Start     Dose/Rate Route Frequency Ordered Stop   05/28/24 1930  azithromycin  (ZITHROMAX ) 500 mg in sodium chloride  0.9 % 250 mL IVPB        500 mg 250 mL/hr over 60 Minutes Intravenous Every 24 hours 05/28/24 1916 06/02/24 1929   05/28/24 1745  cefTRIAXone  (ROCEPHIN ) 1 g in sodium chloride  0.9 % 100 mL IVPB        1 g 200 mL/hr over 30 Minutes Intravenous  Once 05/28/24 1737 05/28/24 1818       Procedures:   Consultants:

## 2024-05-29 NOTE — Assessment & Plan Note (Signed)
 Treated with DKA protocol, anion gap closed, switch to subcu Lantus  and sliding scale along with IV fluids for hydration, continue to monitor diabetic education ordered.  Lab Results  Component Value Date   HGBA1C 11.0 (H) 05/29/2024   CBG (last 3)  Recent Labs    05/30/24 0410 05/30/24 0512 05/30/24 0802  GLUCAP 149* 142* 159*

## 2024-05-29 NOTE — H&P (Addendum)
 History and Physical    RONDEY FALLEN FMW:[id number] DOB: 06-Apr-1961 DOA: 05/28/2024  DOS: the patient was seen and examined on 05/28/2024  PCP: Theotis Haze ORN, NP   Patient coming from: Home  I have personally briefly reviewed patient's old medical records in [facility]  CC: N/V/abd pain HPI: 63 year old African male with a history of diabetes, hypertension, mixed hyperlipidemia, tobacco abuse, presents to the ER with a 4 to 5-day history of nausea, vomiting and abdominal pain.  He states he has not eaten anything in the last 3 to 5 days.  He has had some nausea.  Occasional diarrhea.  He thinks he may have seen some blood in his vomit.  He has been febrile at home and coughing.  Due to his inability to eat or drink anything, he has not taken his medications in several days.  Around the ER, temp 98.8 heart rate 90 blood pressure 133/58 satting 99% on room air.  Lab work showed a white count of 17.1, hemoglobin of 12.3, platelets of 160  Sodium 124, potassium 5.2, bicarb is 16, BUN of 37, creatinine 1.67, glucose of 459  Calcium  9.2, total protein 7.8, albumin 3.5, AST 22, ALT of 12, alk phos of 90, total bili 0.6, anion gap of 24  Lipase is 17  Urinalysis showed specific gravity 1.021, pH of 5, glucose greater than thousand, ketones 40, protein 30, nitrate negative, leukocyte esterase negative  Influenza A, COVID, RSV were negative.  Beta hydroxybutyric acid was elevated at 3.15.  Venous i-STAT blood gas showed pH of 7.36 pCO2 39, pO2 of 20  Patient diagnosed with DKA and started on an insulin  drip.  [facility] consulted for admission.  Due to overwhelming patient volumes at all [place] system hospitals during current respiratory season, the patient has been in the ER since   Yesterday afternoon.    He remained on IV insulin  overnight.  By this morning, his anion gap closed to 11, bicarbonate up to 24 and his insulin  drip was discontinued.  CT scan  abdomen pelvis demonstrated right lower lobe lobar pneumonia  Chest x-ray showed right lower lobe pneumonia  Significant Events: Admitted 05/28/2024 DKA, right lower lobe pneumonia, sepsis with acute organ   Admission Labs: white count of 17.1, hemoglobin of 12.3, platelets of 160 Sodium 124, potassium 5.2, bicarb is 16, BUN of 37, creatinine 1.67, glucose of 459 Calcium  9.2, total protein 7.8, albumin 3.5, AST 22, ALT of 12, alk phos of 90, total bili 0.6, anion gap of 24 Lipase is 17 Urinalysis showed specific gravity 1.021, pH of 5, glucose greater than thousand, ketones 40, protein 30, nitrate negative, leukocyte esterase negative Influenza A, COVID, RSV were negative Beta hydroxybutyric acid was elevated at 3.15. Venous i-STAT blood gas showed pH of 7.36 pCO2 39, pO2 of 20  Admission Imaging Studies: CT scan abdomen pelvis demonstrated right lower lobe lobar pneumonia Chest x-ray showed right lower lobe pneumonia  Significant Labs:   Significant Imaging Studies:   Antibiotic Therapy: Anti-infectives (From admission, onward)    Start     Dose/Rate Route Frequency Ordered Stop   05/28/24 1930  azithromycin  (ZITHROMAX ) 500 mg in sodium chloride  0.9 % 250 mL IVPB        500 mg 250 mL/hr over 60 Minutes Intravenous Every 24 hours 05/28/24 1916 06/02/24 1929   05/28/24 1745  cefTRIAXone  (ROCEPHIN ) 1 g in sodium chloride  0.9 % 100 mL IVPB        1 g  200 mL/hr over 30 Minutes Intravenous  Once 05/28/24 1737 05/28/24 1818       Procedures:   Consultants:     ED Course: Patient started on IV insulin  due to DKA.  Overnight his anion gap closed and his DKA is resolved.  Started on IV antibiotics for his lobar pneumonia.  Remains on IV fluids due to AKI.  Review of Systems:  Review of Systems  Constitutional:  Positive for fever and malaise/fatigue.  HENT: Negative.    Eyes: Negative.   Respiratory:  Positive for cough and shortness of breath.   Cardiovascular:  Negative.   Gastrointestinal:  Positive for abdominal pain, diarrhea, nausea and vomiting.  Genitourinary: Negative.   Musculoskeletal: Negative.   Skin: Negative.   Neurological:  Positive for headaches.  Endo/Heme/Allergies: Negative.        Hasn't taken meds in several days  Psychiatric/Behavioral: Negative.    All other systems reviewed and are negative.   Past Medical History:  Diagnosis Date   Chronic back pain    Chronic lower back pain    Chronic neck pain    DDD (degenerative disc disease), lumbar    Diabetes mellitus    Genital herpes    Hyperlipidemia     Past Surgical History:  Procedure Laterality Date   SHOULDER SURGERY       reports that he has been smoking cigarettes. He started smoking about 46 years ago. He has a 46.2 pack-year smoking history. He has never used smokeless tobacco. He reports that he does not drink alcohol and does not use drugs.  Allergies[1]  History reviewed. No pertinent family history.  Prior to Admission medications  Medication Sig Start Date End Date Taking? Authorizing Provider  lisinopril  (ZESTRIL ) 5 MG tablet Take 5 mg by mouth daily. 04/30/24  Yes [provider]  Accu-Chek Softclix Lancets lancets Use to check blood sugar 3 times daily.Must have office visit for refills 12/05/23   Delbert Clam, MD  aspirin  EC 81 MG tablet Take 1 tablet (81 mg total) by mouth daily. 03/30/18   Danton Jon HERO, PA-C  atorvastatin  (LIPITOR) 40 MG tablet Take 1 tablet (40 mg total) by mouth daily. 01/04/24   Fleming, Zelda W, NP  Blood Glucose Monitoring Suppl (ACCU-CHEK GUIDE ME) w/Device KIT Use to check blood sugar 3 times daily.Must have office visit for refills 12/05/23   Newlin, Enobong, MD  clotrimazole -betamethasone  (LOTRISONE ) cream Apply 1 Application topically daily. 07/04/23   Theotis Haze ORN, NP  Continuous Glucose Receiver (DEXCOM G7 RECEIVER) DEVI Check blood glucose levels continuously  E11.65 z79.84 01/04/24   Theotis Haze ORN, NP  Continuous Glucose Sensor (DEXCOM G7 SENSOR) MISC Check blood glucose levels continuously  E11.65 z79.84 01/06/24   Delbert Clam, MD  cyclobenzaprine  (FLEXERIL ) 10 MG tablet Take 1 tablet (10 mg total) by mouth 2 (two) times daily as needed for muscle spasms. 05/09/24   Curatolo, Adam, DO  fenofibrate  (TRICOR ) 145 MG tablet Take 1 tablet (145 mg total) by mouth daily. 09/19/23   Newlin, Enobong, MD  gabapentin  (NEURONTIN ) 100 MG capsule Take 1 capsule (100 mg total) by mouth 3 (three) times daily. 07/04/23   Fleming, Zelda W, NP  glucose blood (ACCU-CHEK GUIDE TEST) test strip Use to check blood sugar 3 times daily. 02/28/24   Newlin, Enobong, MD  ibuprofen  (ADVIL ) 600 MG tablet Take 1 tablet (600 mg total) by mouth every 6 (six) hours as needed. 07/04/23   Fleming, Zelda W, NP  insulin  glargine,  1 Unit Dial , (TOUJEO  SOLOSTAR) 300 UNIT/ML Solostar Pen Inject 50 Units into the skin daily. 04/26/24   Newlin, Enobong, MD  insulin  lispro (HUMALOG ) 100 UNIT/ML KwikPen Inject 40 Units into the skin 2 (two) times daily. 04/26/24   Newlin, Enobong, MD  Insulin  Pen Needle (TRUEPLUS PEN NEEDLES) 31G X 5 MM MISC Use as directed to administer insulin  04/26/24   Delbert Clam, MD  lisinopril  (ZESTRIL ) 2.5 MG tablet Take 1 tablet (2.5 mg total) by mouth daily. 04/09/24   Fleming, Zelda W, NP  mupirocin  ointment (BACTROBAN ) 2 % Apply 1 Application topically 2 (two) times daily. folliculitis Patient not taking: Reported on 04/09/2024 03/08/23   Theotis Haze ORN, NP  nystatin -triamcinolone  ointment (MYCOLOG) Apply 1 Application topically 2 (two) times daily. 07/04/23   Fleming, Zelda W, NP  sildenafil  (VIAGRA ) 100 MG tablet Take 0.5-1 tablets (50-100 mg total) by mouth daily as needed for erectile dysfunction. 04/10/24   Theotis Haze ORN, NP  Vitamin D , Ergocalciferol , (DRISDOL ) 1.25 MG (50000 UNIT) CAPS capsule Take 1 capsule (50,000 Units total) by mouth every 7 (seven) days. 04/10/24   Theotis Haze ORN,  NP    Physical Exam: Vitals:   05/29/24 0305 05/29/24 0719 05/29/24 1035 05/29/24 1106  BP:  (!) 133/54 (!) 121/49   Pulse:  97 89   Resp: (!) 22 18 18    Temp: (!) 101 F (38.3 C) 99.8 F (37.7 C)  100 F (37.8 C)  TempSrc: Oral Oral  Oral  SpO2: 100% 96% 97%   Weight:        Physical Exam Vitals and nursing note reviewed.  Constitutional:      General: He is not in acute distress.    Appearance: He is obese. He is ill-appearing. He is not toxic-appearing.  HENT:     Head: Normocephalic and atraumatic.     Nose: Nose normal.  Cardiovascular:     Rate and Rhythm: Normal rate and regular rhythm.  Pulmonary:     Breath sounds: Examination of the right-lower field reveals rhonchi. Rhonchi present. No wheezing.  Abdominal:     General: Bowel sounds are normal. There is no distension.     Palpations: Abdomen is soft.  Musculoskeletal:     Right lower leg: No edema.     Left lower leg: No edema.  Skin:    General: Skin is warm and dry.     Capillary Refill: Capillary refill takes less than 2 seconds.  Neurological:     Mental Status: He is alert and oriented to person, place, and time.      Labs on Admission: I have personally reviewed following labs and imaging studies  CBC: Recent Labs  Lab 05/28/24 1214 05/28/24 1512  WBC 17.1*  --   HGB 12.3* 13.3  HCT 36.5* 39.0  MCV 85.1  --   PLT 160  --    Basic Metabolic Panel: Recent Labs  Lab 05/28/24 1214 05/28/24 1512 05/29/24 0322  NA 124* 123* 129*  K 5.2* 4.7 3.9  CL 84*  --  94*  CO2 16*  --  24  GLUCOSE 459*  --  164*  BUN 37*  --  39*  CREATININE 1.67*  --  1.60*  CALCIUM  9.2  --  8.5*   GFR: Estimated Creatinine Clearance: 47.7 mL/min (A) (by C-G formula based on SCr of 1.6 mg/dL (H)). Liver Function Tests: Recent Labs  Lab 05/28/24 1214  AST 22  ALT 12  ALKPHOS 90  BILITOT 0.6  PROT 7.8  ALBUMIN 3.5   Recent Labs  Lab 05/28/24 1214  LIPASE 17   CBG: Recent Labs  Lab  05/29/24 0017 05/29/24 0206 05/29/24 0303 05/29/24 0406 05/29/24 1102  GLUCAP 224* 175* 157* 178* 310*   Urine analysis:    Component Value Date/Time   COLORURINE YELLOW 05/28/2024 1213   APPEARANCEUR CLEAR 05/28/2024 1213   LABSPEC 1.021 05/28/2024 1213   PHURINE 5.0 05/28/2024 1213   GLUCOSEU >1,000 (A) 05/28/2024 1213   HGBUR SMALL (A) 05/28/2024 1213   BILIRUBINUR NEGATIVE 05/28/2024 1213   KETONESUR 40 (A) 05/28/2024 1213   PROTEINUR 30 (A) 05/28/2024 1213   NITRITE NEGATIVE 05/28/2024 1213   LEUKOCYTESUR NEGATIVE 05/28/2024 1213    Radiological Exams on Admission: I have personally reviewed images DG Chest Portable 1 View Result Date: 05/28/2024 EXAM: 1 VIEW(S) XRAY OF THE CHEST 05/28/2024 06:24:00 PM COMPARISON: Partial comparison to CT abdomen and pelvis earlier today and CT chest dated 05/15/2018. CLINICAL HISTORY: cough FINDINGS: LUNGS AND PLEURA: Patchy/masslike opacities in the right lower lobe, favoring pneumonia when correlating with recent CT abdomen/pelvis, although incompletely visualized. Mild left basilar opacity atelectasis versus pneumonia. No pleural effusion. No pneumothorax. HEART AND MEDIASTINUM: No acute abnormality of the cardiac and mediastinal silhouettes. BONES AND SOFT TISSUES: No acute osseous abnormality. IMPRESSION: 1. Right lower lobe patchy/masslike opacities, favoring pneumonia on recent CT abdomen/pelvis, incompletely visualized. 2. Mild left basilar opacity, atelectasis versus pneumonia. Electronically signed by: Pinkie Pebbles MD 05/28/2024 07:05 PM EST RP Workstation: [id number]   CT ABDOMEN PELVIS W CONTRAST Result Date: 05/28/2024 EXAM: CT ABDOMEN AND PELVIS WITH CONTRAST 05/28/2024 04:28:53 PM TECHNIQUE: CT of the abdomen and pelvis was performed with the administration of 85 mL of iohexol  (OMNIPAQUE ) 300 MG/ML solution. Multiplanar reformatted images are provided for review. Automated exposure control, iterative reconstruction, and/or  weight-based adjustment of the mA/kV was utilized to reduce the radiation dose to as low as reasonably achievable. COMPARISON: None available. CLINICAL HISTORY: Abdominal pain, acute, nonlocalized. FINDINGS: LOWER CHEST: Peripheral airspace disease with air bronchograms in the right lower lobe most consistent with lobar pneumonia. LIVER: The liver is unremarkable. GALLBLADDER AND BILE DUCTS: Gallbladder is unremarkable. No biliary ductal dilatation. SPLEEN: No acute abnormality. PANCREAS: No acute abnormality. ADRENAL GLANDS: No acute abnormality. KIDNEYS, URETERS AND BLADDER: Nonobstructing calculi within the left kidney measure 1 mm and 4 mm. Similar small calculi which are nonobstructing in the right kidney. No ureteral lithiasis or obstructive uropathy. Duplicated collecting system on the right. There are bilateral simple fluid attenuation cysts on postcontrast imaging consistent with benign cysts. Per consensus, no follow-up is needed for simple Bosniak type 1 and 2 renal cysts, unless the patient has a malignancy history or risk factors. No perinephric or periureteral stranding. Urinary bladder is unremarkable. GI AND BOWEL: Stomach demonstrates no acute abnormality. There is no bowel obstruction. Normal appendix. PERITONEUM AND RETROPERITONEUM: No ascites. No free air. VASCULATURE: Aorta is normal in caliber. LYMPH NODES: No lymphadenopathy. REPRODUCTIVE ORGANS: No acute abnormality. BONES AND SOFT TISSUES: No acute osseous abnormality. No focal soft tissue abnormality. IMPRESSION: 1. Right lower lobe lobar pneumonia. 2. Nonobstructing bilateral nephrolithiasis without ureteral calculi or obstructive uropathy. Electronically signed by: Norleen Boxer MD 05/28/2024 05:22 PM EST RP Workstation: [id number]    EKG: My personal interpretation of EKG shows: NSR      Assessment/Plan Principal Problem:   DKA (diabetic ketoacidosis) (HCC) Active Problems:   Lobar pneumonia - RLL   Sepsis with acute renal  failure without septic shock (  HCC)   AKI (acute kidney injury)   Hyponatremia   Hyperkalemia   Hematemesis    Assessment and Plan: * DKA (diabetic ketoacidosis) (HCC) 05/29/2024 IV insulin  drip started on 05/28/2024 for DKA.  His anion gap is closed overnight.  IV insulin  drip stopped.  He can be placed on Lantus  and sliding scale insulin  now.  Check A1c.   Lobar pneumonia - RLL 05/29/2024 continue with IV Rocephin  and p.o. Zithromax .  Check Legionella and strep pneumo antigen.  Continue with DuoNebs every 6 hours due to rhonchi.  Fortunately he is on room air.   Sepsis with acute renal failure without septic shock (HCC) 05/29/2024 patient meets sepsis criteria with acute organ dysfunction (acute renal failure) due to pneumonia and DKA.  These are both present on admission.  Admission WBC count of 17.1.  Creatinine 1.67 with a normal baseline of 1.0, has had a fever of 101.   AKI (acute kidney injury) 05/29/2024 creatinine 1.67 on admission.  Creatinine down to 1.60.  Decrease IV fluid rate to 75 cc an hour.   Hematemesis 05/29/2024 self-reported hematemesis.  Place him on IV Protonix  40 mg bid.  No documentation of hematemesis here in the ER.  Will use SCDs for DVT prophylaxis given hematemesis.   Hyperkalemia 05/29/2024 had hyperkalemia on admission of 5.2.  Likely due to his DKA.  Serum potassium now down to 3.9.  Resolved.  Hyponatremia 05/29/2024 admission serum sodium of 124.  After overnight IV fluids, his serum sodium is increased to 129.  This may all be due to dehydration from his hyperglycemic hyperosmolar state with subsequent diuresis due to hyperglycemia.  Continue to follow sodium with daily labs.     DVT prophylaxis: SCDs Code Status: Full Code Family Communication: no family at bedside  Disposition Plan: home  Consults called: none  Admission status: Inpatient, Med-Surg   Camellia Door, DO [facility] 05/29/2024, 11:50 AM       [1] No Known  Allergies

## 2024-05-29 NOTE — Assessment & Plan Note (Signed)
 Likely aspiration pneumonia from nausea vomiting prior to admission, switch Rocephin  to Unasyn , continue azithromycin , no signs of ongoing aspiration, monitor cultures.

## 2024-05-29 NOTE — ED Notes (Signed)
 Called Kim at [facility] for transport 14:02-TC

## 2024-05-29 NOTE — Inpatient Diabetes Management (Signed)
 Inpatient Diabetes Program Recommendations  AACE/ADA: New Consensus Statement on Inpatient Glycemic Control (2015)  Target Ranges:  Prepandial:   less than 140 mg/dL      Peak postprandial:   less than 180 mg/dL (1-2 hours)      Critically ill patients:  140 - 180 mg/dL   Lab Results  Component Value Date   GLUCAP 178 (H) 05/29/2024   HGBA1C 11.5 (A) 04/09/2024    Review of Glycemic Control  Diabetes history: DM 2 Outpatient Diabetes medications: Toujeo  50 units, Humalog  40 units bid, Dexcom G7 Current orders for Inpatient glycemic control:  IV insulin  Endotool  A1c 11.5% on 10/27 Glucose 459 on presentation with N/V for 3 days Per PCP notes Hx of medication non-adherence not consistently checking glucose trends at home  Has been referred to Endocrinology in the past but appointment never made due to waiting so far in the future  Inpatient Diabetes Program Recommendations:    -   obtain Betahydroxybutyric acid level to assess acidosis status  At time of transition consider: -   Lantus  40 units Daily (overlap 2 hours with IV insulin  once basal insulin  administered) -  Novolog  0-15 units tid + hs -  Novolog  4 units tid meal coverage if eating >50% of meals  PCP at [facility] Also sees PharmD for DM follow up and adjustment. Counseled extensively on 11/13  Thanks,  Clotilda Bull RN, MSN, BC-ADM Inpatient Diabetes Coordinator Team Pager [phone] (8a-5p)

## 2024-05-29 NOTE — ED Notes (Signed)
 Per MD orders, insulin  and D5LR d/c. No SubQ insulin  ordered.

## 2024-05-29 NOTE — ED Notes (Signed)
 Pt. Requested something to drink, brought pt. Ice water, pt. Refused water and said he would not drink the water that he would drink anything other than water. Brought pt. Diet ginger ale instead

## 2024-05-29 NOTE — Assessment & Plan Note (Signed)
 Treatment as above

## 2024-05-30 ENCOUNTER — Inpatient Hospital Stay (HOSPITAL_COMMUNITY)

## 2024-05-30 DIAGNOSIS — E1110 Type 2 diabetes mellitus with ketoacidosis without coma: Secondary | ICD-10-CM | POA: Diagnosis not present

## 2024-05-30 LAB — COMPREHENSIVE METABOLIC PANEL WITH GFR
ALT: 28 U/L (ref 0–44)
AST: 38 U/L (ref 15–41)
Albumin: 3.1 g/dL — ABNORMAL LOW (ref 3.5–5.0)
Alkaline Phosphatase: 109 U/L (ref 38–126)
Anion gap: 12 (ref 5–15)
BUN: 54 mg/dL — ABNORMAL HIGH (ref 8–23)
CO2: 22 mmol/L (ref 22–32)
Calcium: 8.4 mg/dL — ABNORMAL LOW (ref 8.9–10.3)
Chloride: 96 mmol/L — ABNORMAL LOW (ref 98–111)
Creatinine, Ser: 3.03 mg/dL — ABNORMAL HIGH (ref 0.61–1.24)
GFR, Estimated: 23 mL/min — ABNORMAL LOW (ref >60)
Glucose, Bld: 159 mg/dL — ABNORMAL HIGH (ref 70–99)
Potassium: 4 mmol/L (ref 3.5–5.1)
Sodium: 130 mmol/L — ABNORMAL LOW (ref 135–145)
Total Bilirubin: 0.2 mg/dL (ref 0.0–1.2)
Total Protein: 6.4 g/dL — ABNORMAL LOW (ref 6.5–8.1)

## 2024-05-30 LAB — GLUCOSE, CAPILLARY
Glucose-Capillary: 142 mg/dL — ABNORMAL HIGH (ref 70–99)
Glucose-Capillary: 149 mg/dL — ABNORMAL HIGH (ref 70–99)
Glucose-Capillary: 159 mg/dL — ABNORMAL HIGH (ref 70–99)
Glucose-Capillary: 169 mg/dL — ABNORMAL HIGH (ref 70–99)
Glucose-Capillary: 173 mg/dL — ABNORMAL HIGH (ref 70–99)
Glucose-Capillary: 188 mg/dL — ABNORMAL HIGH (ref 70–99)
Glucose-Capillary: 195 mg/dL — ABNORMAL HIGH (ref 70–99)
Glucose-Capillary: 230 mg/dL — ABNORMAL HIGH (ref 70–99)
Glucose-Capillary: 241 mg/dL — ABNORMAL HIGH (ref 70–99)
Glucose-Capillary: 323 mg/dL — ABNORMAL HIGH (ref 70–99)

## 2024-05-30 LAB — BETA-HYDROXYBUTYRIC ACID
Beta-Hydroxybutyric Acid: <0.05 mmol/L — ABNORMAL LOW (ref 0.05–0.27)
Beta-Hydroxybutyric Acid: 1.4 mmol/L — ABNORMAL HIGH (ref 0.05–0.27)
Beta-Hydroxybutyric Acid: 0.05 mmol/L (ref 0.05–0.27)

## 2024-05-30 LAB — URINALYSIS, ROUTINE W REFLEX MICROSCOPIC
Bacteria, UA: NONE SEEN
Bilirubin Urine: NEGATIVE
Glucose, UA: NEGATIVE mg/dL
Ketones, ur: NEGATIVE mg/dL
Leukocytes,Ua: NEGATIVE
Nitrite: NEGATIVE
Protein, ur: 30 mg/dL — AB
Specific Gravity, Urine: 1.016 (ref 1.005–1.030)
pH: 5 (ref 5.0–8.0)

## 2024-05-30 LAB — CBC WITH DIFFERENTIAL/PLATELET
Abs Immature Granulocytes: 0.1 K/uL — ABNORMAL HIGH (ref 0.00–0.07)
Basophils Absolute: 0 K/uL (ref 0.0–0.1)
Basophils Relative: 0 %
Eosinophils Absolute: 0.1 K/uL (ref 0.0–0.5)
Eosinophils Relative: 1 %
HCT: 30.9 % — ABNORMAL LOW (ref 39.0–52.0)
Hemoglobin: 10.8 g/dL — ABNORMAL LOW (ref 13.0–17.0)
Immature Granulocytes: 1 %
Lymphocytes Relative: 13 %
Lymphs Abs: 1.7 K/uL (ref 0.7–4.0)
MCH: 29.7 pg (ref 26.0–34.0)
MCHC: 35 g/dL (ref 30.0–36.0)
MCV: 84.9 fL (ref 80.0–100.0)
Monocytes Absolute: 1 K/uL (ref 0.1–1.0)
Monocytes Relative: 8 %
Neutro Abs: 10.1 K/uL — ABNORMAL HIGH (ref 1.7–7.7)
Neutrophils Relative %: 77 %
Platelets: 212 K/uL (ref 150–400)
RBC: 3.64 MIL/uL — ABNORMAL LOW (ref 4.22–5.81)
RDW: 12.4 % (ref 11.5–15.5)
WBC: 13.1 K/uL — ABNORMAL HIGH (ref 4.0–10.5)
nRBC: 0 % (ref 0.0–0.2)

## 2024-05-30 LAB — PHOSPHORUS: Phosphorus: 1.9 mg/dL — ABNORMAL LOW (ref 2.5–4.6)

## 2024-05-30 LAB — BASIC METABOLIC PANEL WITH GFR
Anion gap: 12 (ref 5–15)
Anion gap: 13 (ref 5–15)
BUN: 53 mg/dL — ABNORMAL HIGH (ref 8–23)
BUN: 52 mg/dL — ABNORMAL HIGH (ref 8–23)
CO2: 23 mmol/L (ref 22–32)
CO2: 22 mmol/L (ref 22–32)
Calcium: 8.3 mg/dL — ABNORMAL LOW (ref 8.9–10.3)
Calcium: 8.3 mg/dL — ABNORMAL LOW (ref 8.9–10.3)
Chloride: 97 mmol/L — ABNORMAL LOW (ref 98–111)
Chloride: 96 mmol/L — ABNORMAL LOW (ref 98–111)
Creatinine, Ser: 3.09 mg/dL — ABNORMAL HIGH (ref 0.61–1.24)
Creatinine, Ser: 3.3 mg/dL — ABNORMAL HIGH (ref 0.61–1.24)
GFR, Estimated: 22 mL/min — ABNORMAL LOW (ref >60)
GFR, Estimated: 20 mL/min — ABNORMAL LOW (ref >60)
Glucose, Bld: 124 mg/dL — ABNORMAL HIGH (ref 70–99)
Glucose, Bld: 306 mg/dL — ABNORMAL HIGH (ref 70–99)
Potassium: 3.9 mmol/L (ref 3.5–5.1)
Potassium: 3.7 mmol/L (ref 3.5–5.1)
Sodium: 132 mmol/L — ABNORMAL LOW (ref 135–145)
Sodium: 131 mmol/L — ABNORMAL LOW (ref 135–145)

## 2024-05-30 LAB — HIV ANTIBODY (ROUTINE TESTING W REFLEX): HIV Screen 4th Generation wRfx: NONREACTIVE

## 2024-05-30 LAB — STREP PNEUMONIAE URINARY ANTIGEN: Strep Pneumo Urinary Antigen: NEGATIVE

## 2024-05-30 LAB — PROCALCITONIN: Procalcitonin: 2.92 ng/mL

## 2024-05-30 LAB — MAGNESIUM: Magnesium: 2.3 mg/dL (ref 1.7–2.4)

## 2024-05-30 LAB — C-REACTIVE PROTEIN: CRP: 32.7 mg/dL — ABNORMAL HIGH (ref <1.0)

## 2024-05-30 MED ORDER — SODIUM CHLORIDE 0.9 % IV SOLN
3.0000 g | Freq: Two times a day (BID) | INTRAVENOUS | Status: DC
  Administered 2024-05-30 – 2024-06-01 (×6): 3 g via INTRAVENOUS
  Filled 2024-05-30 (×6): qty 8

## 2024-05-30 MED ORDER — ACETAMINOPHEN 325 MG PO TABS
650.0000 mg | ORAL_TABLET | Freq: Four times a day (QID) | ORAL | Status: DC | PRN
  Administered 2024-05-31 – 2024-06-02 (×5): 650 mg via ORAL
  Filled 2024-05-30 (×5): qty 2

## 2024-05-30 MED ORDER — LACTATED RINGERS IV SOLN: INTRAVENOUS | Status: DC

## 2024-05-30 MED ORDER — INSULIN GLARGINE 100 UNIT/ML ~~LOC~~ SOLN
25.0000 [IU] | Freq: Two times a day (BID) | SUBCUTANEOUS | Status: DC
  Administered 2024-05-30 (×2): 25 [IU] via SUBCUTANEOUS
  Filled 2024-05-30 (×3): qty 0.25

## 2024-05-30 MED ORDER — SODIUM PHOSPHATES 45 MMOLE/15ML IV SOLN
30.0000 mmol | Freq: Once | INTRAVENOUS | Status: AC
  Administered 2024-05-30: 07:00:00 30 mmol via INTRAVENOUS
  Filled 2024-05-30: qty 10

## 2024-05-30 MED ORDER — INSULIN ASPART 100 UNIT/ML IJ SOLN
0.0000 [IU] | Freq: Every day | INTRAMUSCULAR | Status: DC
  Administered 2024-05-30: 21:00:00 4 [IU] via SUBCUTANEOUS
  Filled 2024-05-30: qty 4

## 2024-05-30 MED ORDER — INSULIN ASPART 100 UNIT/ML IJ SOLN
0.0000 [IU] | Freq: Three times a day (TID) | INTRAMUSCULAR | Status: DC
  Administered 2024-05-30 (×2): 5 [IU] via SUBCUTANEOUS
  Administered 2024-05-30: 10:00:00 3 [IU] via SUBCUTANEOUS
  Filled 2024-05-30: qty 5
  Filled 2024-05-30: qty 3
  Filled 2024-05-30: qty 5

## 2024-05-30 MED ORDER — INSULIN ASPART 100 UNIT/ML IJ SOLN
4.0000 [IU] | Freq: Three times a day (TID) | INTRAMUSCULAR | Status: DC
  Administered 2024-05-30 – 2024-05-31 (×5): 4 [IU] via SUBCUTANEOUS
  Filled 2024-05-30 (×5): qty 4

## 2024-05-30 MED ORDER — IPRATROPIUM-ALBUTEROL 0.5-2.5 (3) MG/3ML IN SOLN
3.0000 mL | RESPIRATORY_TRACT | Status: DC | PRN
  Administered 2024-06-01: 3 mL via RESPIRATORY_TRACT
  Filled 2024-05-30: qty 3

## 2024-05-30 NOTE — Inpatient Diabetes Management (Addendum)
 Inpatient Diabetes Program Recommendations  AACE/ADA: New Consensus Statement on Inpatient Glycemic Control (2015)  Target Ranges:  Prepandial:   less than 140 mg/dL      Peak postprandial:   less than 180 mg/dL (1-2 hours)      Critically ill patients:  140 - 180 mg/dL    Latest Reference Range & Units 05/29/24 03:35  Hemoglobin A1C 4.8 - 5.6 % 11.0 (H)  269 mg/dl  (H): Data is abnormally high  Latest Reference Range & Units 05/29/24 21:12 05/29/24 22:15 05/29/24 23:27 05/30/24 00:23 05/30/24 01:16 05/30/24 02:12 05/30/24 03:11 05/30/24 04:10 05/30/24 05:12 05/30/24 08:02  Glucose-Capillary 70 - 99 mg/dL 686 (H)  IV Insulin  Drip 272 (H) 216 (H) 195 (H) 169 (H) 188 (H) 173 (H) 149 (H) 142 (H) 159 (H)  7 units Novolog  @0950   25 units Lantus  @0939   (H): Data is abnormally high   Admit with: DKA  History: DM  Home DM Meds: Toujeo  50 units daily        Humalog  40 units BID        Dexcom G7 CGM  Current Orders: Novolog  Moderate Correction Scale/ SSI (0-15 units) TID AC + HS     Lantus  25 units BID     Novolog  4 units TID with meals   Transitioned to SQ Insulin  again this AM  Addendum: 12:15pm--Met w/ pt at bedside (several family members present as well).  Pt confirmed with me his last appt with the PharmD at the [facility] in Nov.  Told me that the Toujeo  insulin  they gave him didn't work (stated he ws still having high CBGs) and that he switched back to his previous Lantus  insulin .  One of the family members in the room seemed to think that pt may not have been taking any basal insulin  for the last few weeks and may have just restarted his old Lantus  Rx PTA.  Could not get a straight answer from pt to confirm this.  He is not using the Dexcom G7 CGM right now--needs to get a refill from the pharmacy.  Uses a reader device with the CGM.  Asked family to please make sure pt picks up refill on his Dexcom after d/c.  Reviewed current A1c of 11% with pt and family and also reviewed  with pt and family the need for better CBG control.  Explained to pt that we have transitioned to SQ Insulin  this AM and will check CBGs AC/HS.  Encouraged pt to call PCP office to make follow up visit to explore other basal insulin  options and/or to get pt back on Lantus .  Explained to pt the importance of using his insulin  as prescribed to prevent DKA and explained the difference between long and quick acting insulins.  Also reviewed with pt all the treatments we have given him for DKA.  Pt was very concerned about his kidney function.  I reviewed the last labs with pt and discussed with him that the MD mentioned further hydration and renal US .     PCP: [facility] clinic Last Seen by Pharm D 04/26/2024 Was told to take the following: -Continue Humalog  at current doses. Advised to take 15 minutes prior to eating.  Humalog  30 units BID before meals (takes 40 units sometimes BID to TID)  -Stop Lantus .  -Start Toujeo  50 units daily.    --Will follow patient during hospitalization--  Adina Rudolpho Arrow RN, MSN, CDCES Diabetes Coordinator Inpatient Glycemic Control Team Team Pager: [phone] (8a-5p)

## 2024-05-30 NOTE — Progress Notes (Signed)
 [facility] night cross cover note:   I was notified by the patient's RN that the patient was experiencing an episode of chest pain associated with some shortness of breath.  Shortly after onset, the patient's chest discomfort began to dissipate spontaneously, with residual chest pain reported to be 2 out of 10. Most recent VS notable for 137/68, hr=101, o2= 98%RA.   He is here with DKA, pneumonia, and is noted to have 3 small Mallory-Weiss tears. On ppi.  Will check stat EKG as well as CXR. Has existing order for Prn acetaminophen  for residual discomfort.     Eva Pore, DO Hospitalist

## 2024-05-30 NOTE — Progress Notes (Signed)
°   05/30/24 1951  Vitals  Temp 98.1 F (36.7 C)  Temp Source Oral  BP 137/68  MAP (mmHg) 88  BP Location Left Arm  BP Method Automatic  Patient Position (if appropriate) Sitting  Pulse Rate (!) 104  Pulse Rate Source Monitor  ECG Heart Rate (!) 105  Resp 17  MEWS COLOR  MEWS Score Color Green  Oxygen Therapy  SpO2 98 %  MEWS Score  MEWS Temp 0  MEWS Systolic 0  MEWS Pulse 1  MEWS RR 0  MEWS LOC 0  MEWS Score 1  Provider Notification  Provider Name/Title Dr. Marcene  Date Provider Notified 05/30/24  Method of Notification Page  Notification Reason Other (Comment) (pt c/o chest discomfort)  Provider response See new orders  Date of Provider Response 05/30/24   Pt c/o 8/10 left sided chest discomfort. He described the discomfort as it is hard for me to breathe. About 2 minutes pt stated the discomfort had resolved. He currently rates it at 2 or 3. Offered supplemental O2 but pt declined. EKG and chest xray ordered.

## 2024-05-30 NOTE — Evaluation (Signed)
 Occupational Therapy Evaluation Patient Details Name: Christian Navarro MRN: [id number] DOB: 06/08/1961 Today's Date: 05/30/2024   History of Present Illness   63 year old African male with a history of diabetes, hypertension, mixed hyperlipidemia, tobacco abuse, presents to the ER with a 4 to 5-day history of nausea, vomiting and abdominal pain. Pt reported that he has not eaten anything in the last 3 to 5 days, had some nausea, coughing and occasional diarrhea. He had not taken his medications in several days     Clinical Impressions Pt is ay baseline Ind level of function with ADLs and ADL mobility, no ADs required. Pt with no LOB, incoordination or c/o pain. Pt initially sleepy upon arrival but agreeable to OOB activity. No further acute OT services are indicated at this time. OT will sign off     If plan is discharge home, recommend the following:         Functional Status Assessment   Patient has not had a recent decline in their functional status     Equipment Recommendations   None recommended by OT     Recommendations for Other Services         Precautions/Restrictions   Precautions Precautions: None Restrictions Weight Bearing Restrictions Per Provider Order: No     Mobility Bed Mobility Overal bed mobility: Independent                  Transfers Overall transfer level: Independent Equipment used: 1 person hand held assist                      Balance Overall balance assessment: No apparent balance deficits (not formally assessed)                                         ADL either performed or assessed with clinical judgement   ADL Overall ADL's : Independent;At baseline                                             Vision Ability to See in Adequate Light: 0 Adequate Patient Visual Report: No change from baseline       Perception         Praxis         Pertinent Vitals/Pain Pain  Assessment Pain Assessment: No/denies pain     Extremity/Trunk Assessment Upper Extremity Assessment Upper Extremity Assessment: Right hand dominant;Overall [facility] for tasks assessed   Lower Extremity Assessment Lower Extremity Assessment: Defer to PT evaluation       Communication Communication Communication: No apparent difficulties   Cognition Arousal: Alert Behavior During Therapy: Flat affect               OT - Cognition Comments: pt initially sleepy                 Following commands: Intact       Cueing  General Comments          Exercises     Shoulder Instructions      Home Living Family/patient expects to be discharged to:: Private residence Living Arrangements: Other relatives Available Help at Discharge: Family Type of Home: House Home Access: Level entry     Home Layout: One level  Bathroom Shower/Tub: Tub/shower unit;Walk-in shower   Bathroom Toilet: Standard     Home Equipment: None          Prior Functioning/Environment Prior Level of Function : Independent/Modified Independent             Mobility Comments: no AD ADLs Comments: Ind with selfcare    OT Problem List: Decreased activity tolerance   OT Treatment/Interventions:        OT Goals(Current goals can be found in the care plan section)   Acute Rehab OT Goals Patient Stated Goal: none stated   OT Frequency:       Co-evaluation              AM-PAC OT 6 Clicks Daily Activity     Outcome Measure Help from another person eating meals?: None Help from another person taking care of personal grooming?: None Help from another person toileting, which includes using toliet, bedpan, or urinal?: None Help from another person bathing (including washing, rinsing, drying)?: None Help from another person to put on and taking off regular upper body clothing?: None Help from another person to put on and taking off regular lower body clothing?: None 6  Click Score: 24   End of Session Nurse Communication: Mobility status  Activity Tolerance: Patient tolerated treatment well Patient left: in bed  OT Visit Diagnosis: Muscle weakness (generalized) (M62.81)                Time: 8882-8863 OT Time Calculation (min): 19 min Charges:  OT General Charges $OT Visit: 1 Visit OT Evaluation $OT Eval Low Complexity: 1 Low    Jacques Karna Loose 05/30/2024, 12:48 PM

## 2024-05-30 NOTE — Progress Notes (Signed)
 Patient was not available when CM attempted to meet with him. Will f/u tomorrow on possible needs.

## 2024-05-30 NOTE — Progress Notes (Addendum)
 PROGRESS NOTE     Patient Demographics:    Christian Navarro, is a 63 y.o. male, DOB - [birth year], FMW:[id number]  Outpatient Primary MD for the patient is Theotis Haze ORN, NP    LOS - 1  Admit date - 05/28/2024    Chief Complaint  Patient presents with   Abdominal Pain       Brief Narrative (HPI from H&P)   63 year old African male with a history of diabetes, hypertension, mixed hyperlipidemia, tobacco abuse, presents to the ER with a 4 to 5-day history of nausea, vomiting and abdominal pain. He states he has not eaten anything in the last 3 to 5 days. He has had some nausea. Occasional diarrhea. He thinks he may have seen some blood in his vomit. He has been febrile at home and coughing. Due to his inability to eat or drink anything, he has not taken his medications in several days.   In the ER his workup was consistent with DKA, right lower lobe pneumonia likely aspiration from ongoing nausea vomiting and he was admitted to the hospital.   Subjective:    Rachele Lash today has, No headache, No chest pain, No abdominal pain - No Nausea, No new weakness tingling or numbness, I will cough but no shortness of breath.  Feels better and wants to go home.   Assessment  & Plan :   Assessment & Plan DKA (diabetic ketoacidosis) (HCC) Treated with DKA protocol, anion gap closed, switch to subcu Lantus  and sliding scale along with IV fluids for hydration, continue to monitor diabetic education ordered.  Lab Results  Component Value Date   HGBA1C 11.0 (H) 05/29/2024   CBG (last 3)  Recent Labs    05/30/24 0410 05/30/24 0512 05/30/24 0802  GLUCAP 149* 142* 159*    Lobar pneumonia - RLL Likely aspiration pneumonia from  nausea vomiting prior to admission, switch Rocephin  to Unasyn , continue azithromycin , no signs of ongoing aspiration, monitor cultures.  Sepsis with acute renal failure without septic shock (HCC) Treatment as above  AKI (acute kidney injury) CKD stage IIIb baseline creatinine appears to  be around 1.2, renal function significantly worse, continue hydration, check renal ultrasound, avoid nephrotoxins and monitor  Hyponatremia Due to DKA improved after hydration  Hyperkalemia Due to DKA resolved Hematemesis 3 small Mallory-Weiss tear, multiple episodes of nausea vomiting at home, none here, on IV PPI monitor.  Obesity:  Estimated body mass index is 30.79, follow-up with PCP.        Condition -   Guarded  Family Communication  : None present  Code Status : Full code  Consults  : Diabetic educator  PUD Prophylaxis : PPI   Procedures  :     CT abdomen and pelvis.  Right lower lobe pneumonia.  No acute abdomen findings.      Disposition Plan  :    Status is: Inpatient  DVT Prophylaxis  :    SCDs Start: 05/29/24 1849 Place and maintain sequential compression device Start: 05/29/24 1649 SCDs Start: 05/29/24 1614    Lab Results  Component Value Date   PLT 212 05/30/2024    Diet :  Diet Order             Diet heart healthy/carb modified Room service appropriate? Yes; Fluid consistency: Thin  Diet effective now                    Inpatient Medications  Scheduled Meds:  atorvastatin   40 mg Oral Daily   azithromycin   500 mg Oral Daily   fenofibrate   160 mg Oral Daily   gabapentin   100 mg Oral TID   insulin  aspart  0-15 Units Subcutaneous TID WC   insulin  aspart  0-5 Units Subcutaneous QHS   insulin  aspart  4 Units Subcutaneous TID WC   insulin  glargine  25 Units Subcutaneous BID   ipratropium-albuterol   3 mL Nebulization QID   nicotine   21 mg Transdermal Daily   pantoprazole  (PROTONIX ) IV  40 mg Intravenous Q12H   Continuous Infusions:  lactated  ringers  75 mL/hr at 05/30/24 9372   sodium PHOSPHATE  IVPB (in mmol) 30 mmol (05/30/24 0636)   PRN Meds:.acetaminophen , dextrose , guaiFENesin -dextromethorphan      Objective:   Vitals:   05/30/24 0013 05/30/24 0400 05/30/24 0740 05/30/24 0759  BP: 133/70 (!) 140/52  138/62  Pulse: 91 91  96  Resp: 17 20  (!) 26  Temp: 98.8 F (37.1 C) 98.8 F (37.1 C)  (!) 100.5 F (38.1 C)  TempSrc: Oral Oral  Oral  SpO2:   99%   Weight:      Height:        Wt Readings from Last 3 Encounters:  05/29/24 83.9 kg  05/09/24 85.7 kg  04/09/24 84.4 kg     Intake/Output Summary (Last 24 hours) at 05/30/2024 0833 Last data filed at 05/30/2024 0636 Gross per 24 hour  Intake 2487.92 ml  Output --  Net 2487.92 ml     Physical Exam  Awake Alert, No new F.N deficits, Normal affect [place].AT,PERRAL Supple Neck, No JVD,   Symmetrical Chest wall movement, Good air movement bilaterally, CTAB RRR,No Gallops,Rubs or new Murmurs,  +ve B.Sounds, Abd Soft, No tenderness,   No Cyanosis, Clubbing or edema     Data Review:    Recent Labs  Lab 05/28/24 1214 05/28/24 1512 05/29/24 1743 05/30/24 0326  WBC 17.1*  --  13.2* 13.1*  HGB 12.3* 13.3 11.2* 10.8*  HCT 36.5* 39.0 32.7* 30.9*  PLT 160  --  185 212  MCV 85.1  --  87.0 84.9  MCH 28.7  --  29.8 29.7  MCHC 33.7  --  34.3 35.0  RDW 12.0  --  12.3 12.4  LYMPHSABS  --   --  1.4 1.7  MONOABS  --   --  1.1* 1.0  EOSABS  --   --  0.0 0.1  BASOSABS  --   --  0.0 0.0    Recent Labs  Lab 05/28/24 1214 05/28/24 1512 05/29/24 0322 05/29/24 0335 05/29/24 1743 05/29/24 1744 05/29/24 2006 05/30/24 0326  NA 124* 123* 129*  --   --  127*  --  130*  K 5.2* 4.7 3.9  --   --  4.3  --  4.0  CL 84*  --  94*  --   --  91*  --  96*  CO2 16*  --  24  --   --  17*  --  22  ANIONGAP 24*  --  11  --   --  20*  --  12  GLUCOSE 459*  --  164*  --   --  372*  --  159*  BUN 37*  --  39*  --   --  50*  --  54*  CREATININE 1.67*  --  1.60*  --   --  2.50*   --  3.03*  AST 22  --   --   --   --   --   --  38  ALT 12  --   --   --   --   --   --  28  ALKPHOS 90  --   --   --   --   --   --  109  BILITOT 0.6  --   --   --   --   --   --  0.2  ALBUMIN 3.5  --   --   --   --   --   --  3.1*  CRP  --   --   --   --  34.4*  --   --  32.7*  PROCALCITON  --   --   --   --  2.91  --   --  2.92  LATICACIDVEN  --   --  1.2  --  1.5  --  1.5  --   HGBA1C  --   --   --  11.0*  --   --   --   --   MG  --   --   --   --  2.2  --   --  2.3  PHOS  --   --   --   --  3.5  --   --  1.9*  CALCIUM  9.2  --  8.5*  --   --  8.2*  --  8.4*      Recent Labs  Lab 05/28/24 1214 05/29/24 0322 05/29/24 0335 05/29/24 1743 05/29/24 1744 05/29/24 2006 05/30/24 0326  CRP  --   --   --  34.4*  --   --  32.7*  PROCALCITON  --   --   --  2.91  --   --  2.92  LATICACIDVEN  --  1.2  --  1.5  --  1.5  --   HGBA1C  --   --  11.0*  --   --   --   --   MG  --   --   --  2.2  --   --  2.3  CALCIUM  9.2 8.5*  --   --  8.2*  --  8.4*    --------------------------------------------------------------------------------------------------------------- Lab Results  Component Value Date   CHOL 187 07/04/2023   HDL 33 (L) 07/04/2023   LDLCALC 129 (H) 07/04/2023   LDLDIRECT 94.7 05/25/2013   TRIG 140 07/04/2023   CHOLHDL 5.7 (H) 07/04/2023    Lab Results  Component Value Date   HGBA1C 11.0 (H) 05/29/2024    Radiology Report DG Chest Port 1 View Result Date: 05/30/2024 EXAM: 1 VIEW(S) XRAY OF THE CHEST 05/30/2024 07:02:32 AM COMPARISON: 05/28/2024 CLINICAL HISTORY: SOB (shortness of breath) FINDINGS: LUNGS AND PLEURA: Progressive hazy right lower lobe opacities. Trace right pleural effusion. No pneumothorax. HEART AND MEDIASTINUM: No acute abnormality of the cardiac and mediastinal silhouettes. BONES AND SOFT TISSUES: Unchanged chronic right 8th rib fracture. IMPRESSION: 1. Progressive hazy right lower lobe opacities. 2. Trace right pleural effusion. Electronically signed  by: Waddell Calk MD 05/30/2024 07:17 AM EST RP Workstation: [id number]   DG Chest Portable 1 View Result Date: 05/28/2024 EXAM: 1 VIEW(S) XRAY OF THE CHEST 05/28/2024 06:24:00 PM COMPARISON: Partial comparison to CT abdomen and pelvis earlier today and CT chest dated 05/15/2018. CLINICAL HISTORY: cough FINDINGS: LUNGS AND PLEURA: Patchy/masslike opacities in the right lower lobe, favoring pneumonia when correlating with recent CT abdomen/pelvis, although incompletely visualized. Mild left basilar opacity atelectasis versus pneumonia. No pleural effusion. No pneumothorax. HEART AND MEDIASTINUM: No acute abnormality of the cardiac and mediastinal silhouettes. BONES AND SOFT TISSUES: No acute osseous abnormality. IMPRESSION: 1. Right lower lobe patchy/masslike opacities, favoring pneumonia on recent CT abdomen/pelvis, incompletely visualized. 2. Mild left basilar opacity, atelectasis versus pneumonia. Electronically signed by: Pinkie Pebbles MD 05/28/2024 07:05 PM EST RP Workstation: [id number]   CT ABDOMEN PELVIS W CONTRAST Result Date: 05/28/2024 EXAM: CT ABDOMEN AND PELVIS WITH CONTRAST 05/28/2024 04:28:53 PM TECHNIQUE: CT of the abdomen and pelvis was performed with the administration of 85 mL of iohexol  (OMNIPAQUE ) 300 MG/ML solution. Multiplanar reformatted images are provided for review. Automated exposure control, iterative reconstruction, and/or weight-based adjustment of the mA/kV was utilized to reduce the radiation dose to as low as reasonably achievable. COMPARISON: None available. CLINICAL HISTORY: Abdominal pain, acute, nonlocalized. FINDINGS: LOWER CHEST: Peripheral airspace disease with air bronchograms in the right lower lobe most consistent with lobar pneumonia. LIVER: The liver is unremarkable. GALLBLADDER AND BILE DUCTS: Gallbladder is unremarkable. No biliary ductal dilatation. SPLEEN: No acute abnormality. PANCREAS: No acute abnormality. ADRENAL GLANDS: No acute abnormality. KIDNEYS,  URETERS AND BLADDER: Nonobstructing calculi within the left kidney measure 1 mm and 4 mm. Similar small calculi which are nonobstructing in the right kidney. No ureteral lithiasis or obstructive uropathy. Duplicated collecting system on the right. There are bilateral simple fluid attenuation cysts on postcontrast imaging consistent with benign cysts. Per consensus, no follow-up is needed for simple Bosniak type 1 and 2 renal cysts, unless the patient has a malignancy history or risk factors. No perinephric or periureteral stranding. Urinary bladder is unremarkable. GI AND BOWEL: Stomach demonstrates no acute abnormality. There is no bowel obstruction. Normal appendix. PERITONEUM AND RETROPERITONEUM: No ascites. No free air. VASCULATURE: Aorta is normal in caliber. LYMPH NODES: No lymphadenopathy. REPRODUCTIVE ORGANS: No acute abnormality. BONES AND SOFT TISSUES: No acute osseous abnormality. No focal soft tissue abnormality. IMPRESSION: 1. Right lower lobe lobar pneumonia. 2. Nonobstructing bilateral nephrolithiasis without ureteral calculi or obstructive uropathy. Electronically signed by: Norleen Boxer MD 05/28/2024 05:22 PM EST RP Workstation: [id number]     Signature  -  Lavada Stank M.D on 05/30/2024 at 8:33 AM   -  To page go to [email or web address]

## 2024-05-30 NOTE — Evaluation (Signed)
 Physical Therapy Evaluation Patient Details Name: Christian Navarro MRN: [id number] DOB: 09/25/1960 Today's Date: 05/30/2024  History of Present Illness  63 y.o male presents to [facility] 12/15 reporting abdominal pain, N/V/D also noted the last 3 days. Chest x-ray shows R lower lobe masslike opacities, likely pneumonia. Abdominal CT WNL. PMH: DM2, HTN, hyperlipidemia, tobacco abuse.  Clinical Impression  Pt is currently mobilizing below his baseline due to decreased activity tolerance secondary to multiple days of N/V/D. Pt sleeping upon arrival and requires multiple attempts to wake. Pt heavily encouraged to initiate bed mobility, requiring mod-maxA until seated when increased wakefulness occurred. Pt is able to maintain static sitting balance without assist. MinA required for STS and short distance ambulation for balance and line management. Pt demonstrates good standing balance shifting weight outside BOS and with narrow BOS. Pt would benefit from continued PT services while in house to promote activity tolerance and progress gait. Pt will not need further PT services upon discharge.       If plan is discharge home, recommend the following: A little help with walking and/or transfers;A little help with bathing/dressing/bathroom;Assistance with cooking/housework;Assist for transportation   Can travel by private vehicle        Equipment Recommendations Rolling walker (2 wheels);Cane  Recommendations for Other Services       Functional Status Assessment Patient has had a recent decline in their functional status and demonstrates the ability to make significant improvements in function in a reasonable and predictable amount of time.     Precautions / Restrictions Precautions Precautions: Fall Recall of Precautions/Restrictions: Intact Restrictions Weight Bearing Restrictions Per Provider Order: No      Mobility  Bed Mobility Overal bed mobility: Needs Assistance Bed Mobility: Supine to Sit,  Sit to Supine     Supine to sit: Mod assist, Max assist Sit to supine: Min assist   General bed mobility comments: Heavy verbal and tactilce cues needed for supine to sit, pt not yet fully awake and required significant assist for transfer. Upon sitting, pt now awake and able to demonstrates trunk control with B UE on bed. Light assist needed for positioning upon returning to bed.    Transfers Overall transfer level: Needs assistance Equipment used: None Transfers: Sit to/from Stand Sit to Stand: Min assist           General transfer comment: MinA to initiate stance. Steady upon standing, light hand held assist for balance.    Ambulation/Gait Ambulation/Gait assistance: Contact guard assist Gait Distance (Feet): 8 Feet (8'x2) Assistive device: None Gait Pattern/deviations: Step-through pattern, Decreased step length - right, Decreased step length - left   Gait velocity interpretation: <1.31 ft/sec, indicative of household ambulator   General Gait Details: Pt ambulates to the sink and back. Pt requires assist for line awareness and mangement. No LOB noted with short distance ambulation.  Stairs            Wheelchair Mobility     Tilt Bed    Modified Rankin (Stroke Patients Only)       Balance Overall balance assessment: Needs assistance Sitting-balance support: Bilateral upper extremity supported Sitting balance-Leahy Scale: Good Sitting balance - Comments: No sitting balance concerns   Standing balance support: Single extremity supported Standing balance-Leahy Scale: Good Standing balance comment: Pt able to reach down towards toes and return to upright stance without LOB. Pt demonstrates marches of moderate height without LOB. CGA provided for balance throughout.  Pertinent Vitals/Pain Pain Assessment Pain Assessment: No/denies pain    Home Living Family/patient expects to be discharged to:: Private  residence Living Arrangements: Other relatives Available Help at Discharge: Family Type of Home: House Home Access: Level entry       Home Layout: One level Home Equipment: None      Prior Function Prior Level of Function : Independent/Modified Independent             Mobility Comments: no AD ADLs Comments: Ind with selfcare     Extremity/Trunk Assessment   Upper Extremity Assessment Upper Extremity Assessment: Defer to OT evaluation    Lower Extremity Assessment Lower Extremity Assessment: Overall WFL for tasks assessed    Cervical / Trunk Assessment Cervical / Trunk Assessment: Normal  Communication   Communication Communication: No apparent difficulties    Cognition Arousal: Alert (Pt sleeping upon arrival and initially requires multiple cues to wake. Becomes more alert once seated and remains alert for remainder of mobility.) Behavior During Therapy: WFL for tasks assessed/performed   PT - Cognitive impairments: No apparent impairments                         Following commands: Intact       Cueing Cueing Techniques: Verbal cues, Tactile cues, Visual cues     General Comments General comments (skin integrity, edema, etc.): VSS throughout. No significant skin abnormalities noted.    Exercises     Assessment/Plan    PT Assessment Patient needs continued PT services  PT Problem List Decreased strength;Decreased mobility;Decreased activity tolerance;Decreased balance;Decreased knowledge of use of DME;Decreased knowledge of precautions       PT Treatment Interventions DME instruction;Therapeutic exercise;Gait training;Balance training;Neuromuscular re-education;Therapeutic activities;Functional mobility training;Patient/family education    PT Goals (Current goals can be found in the Care Plan section)  Acute Rehab PT Goals Patient Stated Goal: None stated during session.    Frequency Min 1X/week     Co-evaluation PT/OT/SLP  Co-Evaluation/Treatment: Yes Reason for Co-Treatment: Complexity of the patient's impairments (multi-system involvement);To address functional/ADL transfers PT goals addressed during session: Mobility/safety with mobility;Balance OT goals addressed during session: ADL's and self-care       AM-PAC PT 6 Clicks Mobility  Outcome Measure Help needed turning from your back to your side while in a flat bed without using bedrails?: None Help needed moving from lying on your back to sitting on the side of a flat bed without using bedrails?: A Lot Help needed moving to and from a bed to a chair (including a wheelchair)?: A Little Help needed standing up from a chair using your arms (e.g., wheelchair or bedside chair)?: A Little Help needed to walk in hospital room?: Total Help needed climbing 3-5 steps with a railing? : Total 6 Click Score: 14    End of Session Equipment Utilized During Treatment: Gait belt Activity Tolerance: Patient tolerated treatment well Patient left: in bed;with call bell/phone within reach;with bed alarm set Nurse Communication: Mobility status PT Visit Diagnosis: Unsteadiness on feet (R26.81);Muscle weakness (generalized) (M62.81)    Time: 8969-8947 PT Time Calculation (min) (ACUTE ONLY): 22 min   Charges:   PT Evaluation $PT Eval Moderate Complexity: 1 Mod   PT General Charges $$ ACUTE PT VISIT: 1 Visit         Sabra Morel, PT, DPT  Acute Rehabilitation Services         Office: [phone]     Sabra MARLA Morel 05/30/2024, 2:05 PM

## 2024-05-30 NOTE — Progress Notes (Signed)
 [facility] night cross cover note:   I was notified by the patient's RN that the q4 hour BMPs and every 4 hour beta-hydroxybutyrate acid levels for this patient, who remains on insulin  drip via endotool for dka, expired around 1800 on 12/16. I subsequently placed updated orders for q4hour BMP's x 4 additional occurrences as well as q4h beta-hydroxybutyrate acid levels x 4 additional occurrences.      Eva Pore, DO Hospitalist

## 2024-05-30 NOTE — Plan of Care (Signed)

## 2024-05-31 DIAGNOSIS — E1110 Type 2 diabetes mellitus with ketoacidosis without coma: Secondary | ICD-10-CM | POA: Diagnosis not present

## 2024-05-31 LAB — CBC WITH DIFFERENTIAL/PLATELET
Abs Immature Granulocytes: 0.16 K/uL — ABNORMAL HIGH (ref 0.00–0.07)
Basophils Absolute: 0 K/uL (ref 0.0–0.1)
Basophils Relative: 0 %
Eosinophils Absolute: 0.2 K/uL (ref 0.0–0.5)
Eosinophils Relative: 2 %
HCT: 32.3 % — ABNORMAL LOW (ref 39.0–52.0)
Hemoglobin: 10.9 g/dL — ABNORMAL LOW (ref 13.0–17.0)
Immature Granulocytes: 1 %
Lymphocytes Relative: 12 %
Lymphs Abs: 1.4 K/uL (ref 0.7–4.0)
MCH: 28.8 pg (ref 26.0–34.0)
MCHC: 33.7 g/dL (ref 30.0–36.0)
MCV: 85.2 fL (ref 80.0–100.0)
Monocytes Absolute: 1.2 K/uL — ABNORMAL HIGH (ref 0.1–1.0)
Monocytes Relative: 10 %
Neutro Abs: 8.7 K/uL — ABNORMAL HIGH (ref 1.7–7.7)
Neutrophils Relative %: 75 %
Platelets: 231 K/uL (ref 150–400)
RBC: 3.79 MIL/uL — ABNORMAL LOW (ref 4.22–5.81)
RDW: 12.6 % (ref 11.5–15.5)
WBC: 11.6 K/uL — ABNORMAL HIGH (ref 4.0–10.5)
nRBC: 0 % (ref 0.0–0.2)

## 2024-05-31 LAB — COMPREHENSIVE METABOLIC PANEL WITH GFR
ALT: 42 U/L (ref 0–44)
AST: 45 U/L — ABNORMAL HIGH (ref 15–41)
Albumin: 3 g/dL — ABNORMAL LOW (ref 3.5–5.0)
Alkaline Phosphatase: 146 U/L — ABNORMAL HIGH (ref 38–126)
Anion gap: 11 (ref 5–15)
BUN: 49 mg/dL — ABNORMAL HIGH (ref 8–23)
CO2: 23 mmol/L (ref 22–32)
Calcium: 8.5 mg/dL — ABNORMAL LOW (ref 8.9–10.3)
Chloride: 99 mmol/L (ref 98–111)
Creatinine, Ser: 3.21 mg/dL — ABNORMAL HIGH (ref 0.61–1.24)
GFR, Estimated: 21 mL/min — ABNORMAL LOW (ref >60)
Glucose, Bld: 246 mg/dL — ABNORMAL HIGH (ref 70–99)
Potassium: 3.8 mmol/L (ref 3.5–5.1)
Sodium: 133 mmol/L — ABNORMAL LOW (ref 135–145)
Total Bilirubin: 0.3 mg/dL (ref 0.0–1.2)
Total Protein: 6.5 g/dL (ref 6.5–8.1)

## 2024-05-31 LAB — GLUCOSE, CAPILLARY
Glucose-Capillary: 148 mg/dL — ABNORMAL HIGH (ref 70–99)
Glucose-Capillary: 90 mg/dL (ref 70–99)
Glucose-Capillary: 92 mg/dL (ref 70–99)
Glucose-Capillary: 70 mg/dL (ref 70–99)
Glucose-Capillary: 260 mg/dL — ABNORMAL HIGH (ref 70–99)

## 2024-05-31 LAB — C-REACTIVE PROTEIN: CRP: 28.5 mg/dL — ABNORMAL HIGH (ref <1.0)

## 2024-05-31 LAB — MAGNESIUM: Magnesium: 2.4 mg/dL (ref 1.7–2.4)

## 2024-05-31 LAB — PROCALCITONIN: Procalcitonin: 1.75 ng/mL

## 2024-05-31 LAB — PHOSPHORUS: Phosphorus: 3.3 mg/dL (ref 2.5–4.6)

## 2024-05-31 MED ORDER — INSULIN ASPART 100 UNIT/ML IJ SOLN
3.0000 [IU] | Freq: Three times a day (TID) | INTRAMUSCULAR | Status: DC
  Administered 2024-06-01 (×3): 3 [IU] via SUBCUTANEOUS
  Filled 2024-05-31 (×3): qty 3

## 2024-05-31 MED ORDER — LACTATED RINGERS IV SOLN: INTRAVENOUS | Status: DC

## 2024-05-31 MED ORDER — INSULIN GLARGINE 100 UNIT/ML ~~LOC~~ SOLN
32.0000 [IU] | Freq: Two times a day (BID) | SUBCUTANEOUS | Status: DC
  Administered 2024-05-31: 09:00:00 32 [IU] via SUBCUTANEOUS
  Filled 2024-05-31 (×2): qty 0.32

## 2024-05-31 MED ORDER — INSULIN ASPART 100 UNIT/ML IJ SOLN
0.0000 [IU] | Freq: Three times a day (TID) | INTRAMUSCULAR | Status: DC
  Administered 2024-05-31 – 2024-06-02 (×3): 3 [IU] via SUBCUTANEOUS

## 2024-05-31 MED ORDER — INSULIN GLARGINE 100 UNIT/ML ~~LOC~~ SOLN
28.0000 [IU] | Freq: Two times a day (BID) | SUBCUTANEOUS | Status: DC
  Administered 2024-05-31 – 2024-06-01 (×3): 28 [IU] via SUBCUTANEOUS
  Filled 2024-05-31 (×4): qty 0.28

## 2024-05-31 MED ORDER — INSULIN ASPART 100 UNIT/ML IJ SOLN
0.0000 [IU] | Freq: Every day | INTRAMUSCULAR | Status: DC
  Administered 2024-05-31: 22:00:00 3 [IU] via SUBCUTANEOUS
  Filled 2024-05-31 (×2): qty 3

## 2024-05-31 NOTE — Plan of Care (Signed)
°  Problem: Education: Goal: Knowledge of General Education information will improve Description: Including pain rating scale, medication(s)/side effects and non-pharmacologic comfort measures Outcome: Progressing   Problem: Health Behavior/Discharge Planning: Goal: Ability to manage health-related needs will improve Outcome: Progressing   Problem: Clinical Measurements: Goal: Ability to maintain clinical measurements within normal limits will improve Outcome: Progressing Goal: Will remain free from infection Outcome: Progressing Goal: Diagnostic test results will improve Outcome: Progressing Goal: Respiratory complications will improve Outcome: Progressing Goal: Cardiovascular complication will be avoided Outcome: Progressing   Problem: Activity: Goal: Risk for activity intolerance will decrease Outcome: Progressing   Problem: Nutrition: Goal: Adequate nutrition will be maintained Outcome: Progressing   Problem: Coping: Goal: Level of anxiety will decrease Outcome: Progressing   Problem: Elimination: Goal: Will not experience complications related to bowel motility Outcome: Progressing Goal: Will not experience complications related to urinary retention Outcome: Progressing   Problem: Pain Managment: Goal: General experience of comfort will improve and/or be controlled Outcome: Progressing   Problem: Safety: Goal: Ability to remain free from injury will improve Outcome: Progressing   Problem: Skin Integrity: Goal: Risk for impaired skin integrity will decrease Outcome: Progressing   Problem: Activity: Goal: Ability to tolerate increased activity will improve Outcome: Progressing   Problem: Clinical Measurements: Goal: Ability to maintain a body temperature in the normal range will improve Outcome: Progressing   Problem: Respiratory: Goal: Ability to maintain adequate ventilation will improve Outcome: Progressing Goal: Ability to maintain a clear airway  will improve Outcome: Progressing   Problem: Education: Goal: Ability to describe self-care measures that may prevent or decrease complications (Diabetes Survival Skills Education) will improve Outcome: Progressing Goal: Individualized Educational Video(s) Outcome: Progressing   Problem: Coping: Goal: Ability to adjust to condition or change in health will improve Outcome: Progressing   Problem: Fluid Volume: Goal: Ability to maintain a balanced intake and output will improve Outcome: Progressing   Problem: Health Behavior/Discharge Planning: Goal: Ability to identify and utilize available resources and services will improve Outcome: Progressing Goal: Ability to manage health-related needs will improve Outcome: Progressing   Problem: Metabolic: Goal: Ability to maintain appropriate glucose levels will improve Outcome: Progressing   Problem: Nutritional: Goal: Maintenance of adequate nutrition will improve Outcome: Progressing Goal: Progress toward achieving an optimal weight will improve Outcome: Progressing   Problem: Skin Integrity: Goal: Risk for impaired skin integrity will decrease Outcome: Progressing   Problem: Tissue Perfusion: Goal: Adequacy of tissue perfusion will improve Outcome: Progressing   Problem: Education: Goal: Ability to describe self-care measures that may prevent or decrease complications (Diabetes Survival Skills Education) will improve Outcome: Progressing Goal: Individualized Educational Video(s) Outcome: Progressing   Problem: Cardiac: Goal: Ability to maintain an adequate cardiac output will improve Outcome: Progressing   Problem: Health Behavior/Discharge Planning: Goal: Ability to identify and utilize available resources and services will improve Outcome: Progressing Goal: Ability to manage health-related needs will improve Outcome: Progressing   Problem: Fluid Volume: Goal: Ability to achieve a balanced intake and output will  improve Outcome: Progressing   Problem: Metabolic: Goal: Ability to maintain appropriate glucose levels will improve Outcome: Progressing   Problem: Nutritional: Goal: Maintenance of adequate nutrition will improve Outcome: Progressing Goal: Maintenance of adequate weight for body size and type will improve Outcome: Progressing   Problem: Respiratory: Goal: Will regain and/or maintain adequate ventilation Outcome: Progressing   Problem: Urinary Elimination: Goal: Ability to achieve and maintain adequate renal perfusion and functioning will improve Outcome: Progressing

## 2024-05-31 NOTE — Assessment & Plan Note (Signed)
 Due to DKA resolved.

## 2024-05-31 NOTE — Assessment & Plan Note (Signed)
 Likely aspiration pneumonia from nausea vomiting prior to admission, switch Rocephin  to Unasyn , continue azithromycin , no signs of ongoing aspiration, monitor cultures.

## 2024-05-31 NOTE — Assessment & Plan Note (Signed)
 Due to DKA improved after hydration

## 2024-05-31 NOTE — TOC Transition Note (Signed)
 Transition of Care [facility]) - Discharge Note   Patient Details  Name: Christian Navarro MRN: [id number] Date of Birth: 1960/10/18  [facility]) CM/SW Contact:  Landry DELENA Senters, RN Phone Number: 05/31/2024, 9:30 AM   Clinical Narrative:    RR:ypdunmb of diabetes, hypertension, mixed hyperlipidemia, tobacco abuse, presents to the ER with a 4 to 5-day history of nausea, vomiting and abdominal pain. He states he has not eaten anything in the last 3 to 5 days. He has had some nausea. Occasional diarrhea. He thinks he may have seen some blood in his vomit. He has been febrile at home and coughing. Due to his inability to eat or drink anything, he has not taken his medications in several days.   Patient lives alone, has a friend for transportation home at d/c. Patient has PCP, manages own medications, no DME at home.   Therapy recommendation for cane or rolling walker at home. Patient refuses the need for this.   Patient has no apparent needs identified. Please place [facility] consult if any further needs arise.    Final next level of care: Home/Self Care Barriers to Discharge: No Barriers Identified   Patient Goals and CMS Choice            Discharge Placement                       Discharge Plan and Services Additional resources added to the After Visit Summary for                                       Social Drivers of Health (SDOH) Interventions SDOH Screenings   Food Insecurity: No Food Insecurity (05/29/2024)  Housing: Low Risk (05/29/2024)  Transportation Needs: No Transportation Needs (05/29/2024)  Utilities: Not At Risk (05/29/2024)  Alcohol Screen: Low Risk (07/04/2023)  Depression (PHQ2-9): Medium Risk (04/09/2024)  Financial Resource Strain: Low Risk (07/04/2023)  Physical Activity: Inactive (07/04/2023)  Social Connections: Moderately Isolated (07/04/2023)  Stress: No Stress Concern Present (07/04/2023)  Tobacco Use: High Risk (05/28/2024)  Health  Literacy: Inadequate Health Literacy (07/04/2023)     Readmission Risk Interventions     No data to display

## 2024-05-31 NOTE — Assessment & Plan Note (Signed)
 Treatment as above

## 2024-05-31 NOTE — Progress Notes (Signed)
 PROGRESS NOTE     Patient Demographics:    Christian Navarro, is a 63 y.o. male, DOB - 06-23-1960, FMW:[id number]  Outpatient Primary MD for the patient is Theotis Haze ORN, NP    LOS - 2  Admit date - 05/28/2024    Chief Complaint  Patient presents with   Abdominal Pain       Brief Narrative (HPI from H&P)   63 year old African male with a history of diabetes, hypertension, mixed hyperlipidemia, tobacco abuse, presents to the ER with a 4 to 5-day history of nausea, vomiting and abdominal pain. He states he has not eaten anything in the last 3 to 5 days. He has had some nausea. Occasional diarrhea. He thinks he may have seen some blood in his vomit. He has been febrile at home and coughing. Due to his inability to eat or drink anything, he has not taken his medications in several days.   In the ER his workup was consistent with DKA, right lower lobe pneumonia likely aspiration from ongoing nausea vomiting and he was admitted to the hospital.   Subjective:   Patient in bed, appears comfortable, denies any headache, no fever, no chest pain or pressure, no shortness of breath , no abdominal pain. No new focal weakness.   Assessment  & Plan :   Assessment & Plan DKA (diabetic ketoacidosis) (HCC) Treated with DKA protocol, anion gap closed, switch to subcu Lantus  and sliding scale, dose adjusted further 05/31/2024 for better control, continue to monitor diabetic education ordered.  To her outpatient glycemic control as suggested by extremely elevated A1c  Lab Results  Component Value Date   HGBA1C 11.0 (H) 05/29/2024   CBG (last 3)  Recent Labs    05/30/24 1606 05/30/24 2050 05/31/24 0834  GLUCAP 241* 323* 148*     Lobar pneumonia - RLL Likely aspiration pneumonia from nausea vomiting prior to admission, switch Rocephin  to Unasyn , continue azithromycin , no signs of ongoing aspiration, monitor cultures.  Sepsis with acute renal failure without septic shock (HCC) Treatment as above  AKI (acute kidney injury) CKD stage IIIb baseline creatinine  appears to be around 1.2, renal function significantly worse, continue hydration, stable renal ultrasound, avoid nephrotoxins and monitor  Hyponatremia Due to DKA improved after hydration  Hyperkalemia Due to DKA resolved Hematemesis 3 small Mallory-Weiss tear, multiple episodes of nausea vomiting at home, none here, on IV PPI monitor.  Obesity:  Estimated body mass index is 30.79, follow-up with PCP.        Condition -   Guarded  Family Communication  : None present  Code Status : Full code  Consults  : Diabetic educator  PUD Prophylaxis : PPI   Procedures  :     CT abdomen and pelvis.  Right lower lobe pneumonia.  No acute abdomen findings.      Disposition Plan  :    Status is: Inpatient  DVT Prophylaxis  :    SCDs Start: 05/29/24 1849 Place and maintain sequential compression device Start: 05/29/24 1649 SCDs Start: 05/29/24 1614    Lab Results  Component Value Date   PLT 231 05/31/2024    Diet :  Diet Order             Diet heart healthy/carb modified Room service appropriate? Yes; Fluid consistency: Thin  Diet effective now                    Inpatient Medications  Scheduled Meds:  atorvastatin   40 mg Oral Daily   azithromycin   500 mg Oral Daily   fenofibrate   160 mg Oral Daily   gabapentin   100 mg Oral TID   insulin  aspart  0-20 Units Subcutaneous TID WC   insulin  aspart  0-5 Units Subcutaneous QHS   insulin  aspart  4 Units Subcutaneous TID WC   insulin  glargine  32 Units Subcutaneous BID   nicotine   21 mg Transdermal Daily   pantoprazole  (PROTONIX ) IV  40 mg Intravenous Q12H   Continuous  Infusions:  ampicillin -sulbactam (UNASYN ) IV 3 g (05/30/24 2049)   lactated ringers  75 mL/hr at 05/30/24 2048   PRN Meds:.acetaminophen , dextrose , guaiFENesin -dextromethorphan , ipratropium-albuterol      Objective:   Vitals:   05/30/24 1951 05/31/24 0115 05/31/24 0655 05/31/24 0833  BP: 137/68 (!) 152/66  (!) 148/59  Pulse: (!) 104 97  94  Resp: 17 (!) 27  20  Temp: 98.1 F (36.7 C) 99.1 F (37.3 C)  98.8 F (37.1 C)  TempSrc: Oral Oral Oral Oral  SpO2: 98% 95%    Weight:      Height:        Wt Readings from Last 3 Encounters:  05/29/24 83.9 kg  05/09/24 85.7 kg  04/09/24 84.4 kg    No intake or output data in the 24 hours ending 05/31/24 0841    Physical Exam  Awake Alert, No new F.N deficits, Normal affect [place].AT,PERRAL Supple Neck, No JVD,   Symmetrical Chest wall movement, Good air movement bilaterally, CTAB RRR,No Gallops,Rubs or new Murmurs,  +ve B.Sounds, Abd Soft, No tenderness,   No Cyanosis, Clubbing or edema     Data Review:    Recent Labs  Lab 05/28/24 1214 05/28/24 1512 05/29/24 1743 05/30/24 0326 05/31/24 0402  WBC 17.1*  --  13.2* 13.1* 11.6*  HGB 12.3* 13.3 11.2* 10.8* 10.9*  HCT 36.5* 39.0 32.7* 30.9* 32.3*  PLT 160  --  185 212 231  MCV 85.1  --  87.0 84.9 85.2  MCH 28.7  --  29.8 29.7 28.8  MCHC 33.7  --  34.3 35.0 33.7  RDW 12.0  --  12.3 12.4 12.6  LYMPHSABS  --   --  1.4 1.7 1.4  MONOABS  --   --  1.1* 1.0 1.2*  EOSABS  --   --  0.0 0.1 0.2  BASOSABS  --   --  0.0 0.0 0.0    Recent Labs  Lab 05/28/24 1214 05/28/24 1512 05/29/24 0322 05/29/24 0335 05/29/24 1743 05/29/24 1744 05/29/24 2006 05/30/24 0326 05/30/24 0703 05/30/24 1944 05/31/24 0402  NA 124*   < > 129*  --   --  127*  --  130* 132* 131* 133*  K 5.2*   < > 3.9  --   --  4.3  --  4.0 3.9 3.7 3.8  CL 84*  --  94*  --   --  91*  --  96* 97* 96* 99  CO2 16*  --  24  --   --  17*  --  22 23 22 23   ANIONGAP 24*  --  11  --   --  20*  --  12 12 13 11   GLUCOSE  459*  --  164*  --   --  372*  --  159* 124* 306* 246*  BUN 37*  --  39*  --   --  50*  --  54* 53* 52* 49*  CREATININE 1.67*  --  1.60*  --   --  2.50*  --  3.03* 3.09* 3.30* 3.21*  AST 22  --   --   --   --   --   --  38  --   --  45*  ALT 12  --   --   --   --   --   --  28  --   --  42  ALKPHOS 90  --   --   --   --   --   --  109  --   --  146*  BILITOT 0.6  --   --   --   --   --   --  0.2  --   --  0.3  ALBUMIN 3.5  --   --   --   --   --   --  3.1*  --   --  3.0*  CRP  --   --   --   --  34.4*  --   --  32.7*  --   --  28.5*  PROCALCITON  --   --   --   --  2.91  --   --  2.92  --   --  1.75  LATICACIDVEN  --   --  1.2  --  1.5  --  1.5  --   --   --   --   HGBA1C  --   --   --  11.0*  --   --   --   --   --   --   --   MG  --   --   --   --  2.2  --   --  2.3  --   --  2.4  PHOS  --   --   --   --  3.5  --   --  1.9*  --   --  3.3  CALCIUM  9.2  --  8.5*  --   --  8.2*  --  8.4* 8.3* 8.3* 8.5*   < > = values in this interval  not displayed.      Recent Labs  Lab 05/29/24 0322 05/29/24 0335 05/29/24 1743 05/29/24 1744 05/29/24 2006 05/30/24 0326 05/30/24 0703 05/30/24 1944 05/31/24 0402  CRP  --   --  34.4*  --   --  32.7*  --   --  28.5*  PROCALCITON  --   --  2.91  --   --  2.92  --   --  1.75  LATICACIDVEN 1.2  --  1.5  --  1.5  --   --   --   --   HGBA1C  --  11.0*  --   --   --   --   --   --   --   MG  --   --  2.2  --   --  2.3  --   --  2.4  CALCIUM  8.5*  --   --  8.2*  --  8.4* 8.3* 8.3* 8.5*    --------------------------------------------------------------------------------------------------------------- Lab Results  Component Value Date   CHOL 187 07/04/2023   HDL 33 (L) 07/04/2023   LDLCALC 129 (H) 07/04/2023   LDLDIRECT 94.7 05/25/2013   TRIG 140 07/04/2023   CHOLHDL 5.7 (H) 07/04/2023    Lab Results  Component Value Date   HGBA1C 11.0 (H) 05/29/2024    Radiology Report US  RENAL Result Date: 05/30/2024 EXAM: US  Retroperitoneum Complete,  Renal. 05/30/2024 09:31:00 PM TECHNIQUE: Real-time ultrasonography of the retroperitoneum renal was performed. COMPARISON: US  Retroperitoneum 05/25/2021. CLINICAL HISTORY: AKI (acute kidney injury). FINDINGS: FINDINGS: RIGHT KIDNEY/URETER: Right kidney measures 12.3 x 5.6 x 5.6 cm. Calculated volume: 202 ml. Normal cortical echogenicity. No hydronephrosis. No calculus. No mass. LEFT KIDNEY/URETER: Left kidney measures 5.6 x 3.5 x 4.2 cm. Calculated volume: 147 ml. A 5.6 cm simple cyst is noted in the upper pole of the left kidney. No follow-up is needed Normal cortical echogenicity. No hydronephrosis. No calculus. No mass. BLADDER: Unremarkable appearance of the bladder. IMPRESSION: 1. No acute findings. Electronically signed by: Oneil Devonshire MD 05/30/2024 11:42 PM EST RP Workstation: [id number]   DG Chest Port 1 View Result Date: 05/30/2024 EXAM: 1 VIEW(S) XRAY OF THE CHEST 05/30/2024 09:07:00 PM COMPARISON: 05/30/2024 CLINICAL HISTORY: Chest pain FINDINGS: LUNGS AND PLEURA: Increasing opacification in the right lung base is noted when compared with the prior exam. Stable vascular congestion and mild edema are seen. No pleural effusion. No pneumothorax. HEART AND MEDIASTINUM: No acute abnormality of the cardiac and mediastinal silhouettes. BONES AND SOFT TISSUES: No acute osseous abnormality. IMPRESSION: 1. Increasing opacification in the right lung base compared to the prior exam. 2. Stable vascular congestion and mild edema. Electronically signed by: Oneil Devonshire MD 05/30/2024 11:41 PM EST RP Workstation: [id number]   DG Chest Port 1 View Result Date: 05/30/2024 EXAM: 1 VIEW(S) XRAY OF THE CHEST 05/30/2024 07:02:32 AM COMPARISON: 05/28/2024 CLINICAL HISTORY: SOB (shortness of breath) FINDINGS: LUNGS AND PLEURA: Progressive hazy right lower lobe opacities. Trace right pleural effusion. No pneumothorax. HEART AND MEDIASTINUM: No acute abnormality of the cardiac and mediastinal silhouettes. BONES AND SOFT  TISSUES: Unchanged chronic right 8th rib fracture. IMPRESSION: 1. Progressive hazy right lower lobe opacities. 2. Trace right pleural effusion. Electronically signed by: Waddell Calk MD 05/30/2024 07:17 AM EST RP Workstation: [id number]     Signature  -   Lavada Stank M.D on 05/31/2024 at 8:41 AM   -  To page go to [email or web address]

## 2024-05-31 NOTE — Assessment & Plan Note (Signed)
 Treated with DKA protocol, anion gap closed, switch to subcu Lantus  and sliding scale, dose adjusted further 05/31/2024 for better control, continue to monitor diabetic education ordered.  To her outpatient glycemic control as suggested by extremely elevated A1c  Lab Results  Component Value Date   HGBA1C 11.0 (H) 05/29/2024   CBG (last 3)  Recent Labs    05/30/24 1606 05/30/24 2050 05/31/24 0834  GLUCAP 241* 323* 148*

## 2024-05-31 NOTE — Assessment & Plan Note (Signed)
 3 small Mallory-Weiss tear, multiple episodes of nausea vomiting at home, none here, on IV PPI monitor.

## 2024-05-31 NOTE — Assessment & Plan Note (Signed)
 CKD stage IIIb baseline creatinine appears to be around 1.2, renal function significantly worse, continue hydration, stable renal ultrasound, avoid nephrotoxins and monitor

## 2024-06-01 ENCOUNTER — Other Ambulatory Visit (HOSPITAL_COMMUNITY): Payer: Self-pay

## 2024-06-01 DIAGNOSIS — E1110 Type 2 diabetes mellitus with ketoacidosis without coma: Secondary | ICD-10-CM | POA: Diagnosis not present

## 2024-06-01 LAB — COMPREHENSIVE METABOLIC PANEL WITH GFR
ALT: 49 U/L — ABNORMAL HIGH (ref 0–44)
AST: 49 U/L — ABNORMAL HIGH (ref 15–41)
Albumin: 2.9 g/dL — ABNORMAL LOW (ref 3.5–5.0)
Alkaline Phosphatase: 170 U/L — ABNORMAL HIGH (ref 38–126)
Anion gap: 12 (ref 5–15)
BUN: 43 mg/dL — ABNORMAL HIGH (ref 8–23)
CO2: 22 mmol/L (ref 22–32)
Calcium: 8.6 mg/dL — ABNORMAL LOW (ref 8.9–10.3)
Chloride: 102 mmol/L (ref 98–111)
Creatinine, Ser: 2.91 mg/dL — ABNORMAL HIGH (ref 0.61–1.24)
GFR, Estimated: 24 mL/min — ABNORMAL LOW
Glucose, Bld: 260 mg/dL — ABNORMAL HIGH (ref 70–99)
Potassium: 3.9 mmol/L (ref 3.5–5.1)
Sodium: 137 mmol/L (ref 135–145)
Total Bilirubin: 0.3 mg/dL (ref 0.0–1.2)
Total Protein: 6.6 g/dL (ref 6.5–8.1)

## 2024-06-01 LAB — CBC WITH DIFFERENTIAL/PLATELET
Abs Immature Granulocytes: 0.22 K/uL — ABNORMAL HIGH (ref 0.00–0.07)
Basophils Absolute: 0 K/uL (ref 0.0–0.1)
Basophils Relative: 0 %
Eosinophils Absolute: 0.3 K/uL (ref 0.0–0.5)
Eosinophils Relative: 3 %
HCT: 31.5 % — ABNORMAL LOW (ref 39.0–52.0)
Hemoglobin: 10.7 g/dL — ABNORMAL LOW (ref 13.0–17.0)
Immature Granulocytes: 2 %
Lymphocytes Relative: 17 %
Lymphs Abs: 1.6 K/uL (ref 0.7–4.0)
MCH: 29.2 pg (ref 26.0–34.0)
MCHC: 34 g/dL (ref 30.0–36.0)
MCV: 85.8 fL (ref 80.0–100.0)
Monocytes Absolute: 0.9 K/uL (ref 0.1–1.0)
Monocytes Relative: 9 %
Neutro Abs: 6.3 K/uL (ref 1.7–7.7)
Neutrophils Relative %: 69 %
Platelets: 280 K/uL (ref 150–400)
RBC: 3.67 MIL/uL — ABNORMAL LOW (ref 4.22–5.81)
RDW: 12.7 % (ref 11.5–15.5)
WBC: 9.3 K/uL (ref 4.0–10.5)
nRBC: 0 % (ref 0.0–0.2)

## 2024-06-01 LAB — PROCALCITONIN: Procalcitonin: 1 ng/mL

## 2024-06-01 LAB — GLUCOSE, CAPILLARY
Glucose-Capillary: 109 mg/dL — ABNORMAL HIGH (ref 70–99)
Glucose-Capillary: 115 mg/dL — ABNORMAL HIGH (ref 70–99)
Glucose-Capillary: 129 mg/dL — ABNORMAL HIGH (ref 70–99)
Glucose-Capillary: 188 mg/dL — ABNORMAL HIGH (ref 70–99)
Glucose-Capillary: 99 mg/dL (ref 70–99)

## 2024-06-01 LAB — MAGNESIUM: Magnesium: 2.2 mg/dL (ref 1.7–2.4)

## 2024-06-01 LAB — C-REACTIVE PROTEIN: CRP: 23.8 mg/dL — ABNORMAL HIGH

## 2024-06-01 LAB — PHOSPHORUS: Phosphorus: 3.3 mg/dL (ref 2.5–4.6)

## 2024-06-01 MED ORDER — LACTATED RINGERS IV SOLN: INTRAVENOUS | Status: DC

## 2024-06-01 NOTE — Assessment & Plan Note (Signed)
 Treated with DKA protocol, anion gap closed, switch to subcu Lantus  and sliding scale, dose adjusted further 05/31/2024 for better control, continue to monitor diabetic education ordered.  To her outpatient glycemic control as suggested by extremely elevated A1c  Lab Results  Component Value Date   HGBA1C 11.0 (H) 05/29/2024   CBG (last 3)  Recent Labs    05/31/24 1610 05/31/24 2117 06/01/24 0816  GLUCAP 70 260* 188*

## 2024-06-01 NOTE — Progress Notes (Signed)
 Physical Therapy Treatment and DISCHARGE Patient Details Name: Christian Navarro MRN: [id number] DOB: 10-03-1960 Today's Date: 06/01/2024   History of Present Illness 63 y.o male presents to [facility] 12/15 reporting abdominal pain, N/V/D also noted the last 3 days. Chest x-ray shows R lower lobe masslike opacities, likely pneumonia. Abdominal CT WNL. PMH: DM2, HTN, hyperlipidemia, tobacco abuse.    PT Comments  Pt demonstrates improved tolerance to activity due to decreased pain this session. Pt does well with bed mobility and demonstrates good static stance upon standing. Pt ambulates around the unit without DME, demonstrating occasional gait deficits due to veering or staggering to the R or L but no significant LOB occurs and pt is able to correct gait mechanics. Pt demonstrates good balance by reaching down to touch his toes x5, tandem stance and SLS with R >L difficulty. Pt has met PT goals and denies need for further PT in acute setting. Discharging from PT services at this time, re-consult as needed if mobility needs change.     If plan is discharge home, recommend the following: A little help with walking and/or transfers;Assistance with cooking/housework;Assist for transportation   Can travel by private vehicle        Equipment Recommendations  Cane    Recommendations for Other Services       Precautions / Restrictions Precautions Precautions: Fall Recall of Precautions/Restrictions: Intact Restrictions Weight Bearing Restrictions Per Provider Order: No     Mobility  Bed Mobility Overal bed mobility: Modified Independent Bed Mobility: Supine to Sit     Supine to sit: Modified independent (Device/Increase time)     General bed mobility comments: Uses bed rails as needed for support to complete transfer.    Transfers Overall transfer level: Modified independent Equipment used: None Transfers: Sit to/from Stand Sit to Stand: Modified independent (Device/Increase time)            General transfer comment: Takes additional time to stand, pushes from bed wtih B UE.    Ambulation/Gait Ambulation/Gait assistance: Supervision Gait Distance (Feet): 300 Feet Assistive device: None Gait Pattern/deviations: Step-through pattern, Decreased step length - right, Decreased step length - left, Narrow base of support, Staggering right, Drifts right/left, Staggering left   Gait velocity interpretation: >2.62 ft/sec, indicative of community ambulatory   General Gait Details: Pt demonstrates good functional gait speed. Pt intermitently veers or staggers to the R/L but is able to correct self without significant LOB. Pt repeatedly states don't worry when additional guarding is provided. Pt suddenly began marching or jogging down the hall and did not haveLOB, though did require cuing to discontinue these actions for safety.   Stairs             Wheelchair Mobility     Tilt Bed    Modified Rankin (Stroke Patients Only)       Balance Overall balance assessment: Mild deficits observed, not formally tested Sitting-balance support: No upper extremity supported Sitting balance-Leahy Scale: Normal Sitting balance - Comments: No sitting balance concerns   Standing balance support: No upper extremity supported Standing balance-Leahy Scale: Good Standing balance comment: Occasional veering or staggering noted, pt states this occurs at baseline and is able to correct self without assist. No significant LOB occurs. Single Leg Stance - Right Leg: 10 Single Leg Stance - Left Leg: 30 Tandem Stance - Right Leg: 15 Tandem Stance - Left Leg: 30  Communication Communication Communication: No apparent difficulties  Cognition Arousal: Alert Behavior During Therapy: WFL for tasks assessed/performed   PT - Cognitive impairments: No apparent impairments                         Following commands: Intact      Cueing Cueing  Techniques: Verbal cues, Tactile cues, Visual cues  Exercises      General Comments General comments (skin integrity, edema, etc.): VSS throughout, mild chest tightness reported after ambulation but resolved with rest. No new skin abnormalities noted.      Pertinent Vitals/Pain Pain Assessment Pain Assessment: No/denies pain    Home Living                          Prior Function            PT Goals (current goals can now be found in the care plan section) Acute Rehab PT Goals Patient Stated Goal: No additional goals stated during session. Progress towards PT goals: Goals met/education completed, patient discharged from PT    Frequency     (Discharging from services)      PT Plan      Co-evaluation              AM-PAC PT 6 Clicks Mobility   Outcome Measure  Help needed turning from your back to your side while in a flat bed without using bedrails?: None Help needed moving from lying on your back to sitting on the side of a flat bed without using bedrails?: None Help needed moving to and from a bed to a chair (including a wheelchair)?: None Help needed standing up from a chair using your arms (e.g., wheelchair or bedside chair)?: None Help needed to walk in hospital room?: A Little Help needed climbing 3-5 steps with a railing? : A Little 6 Click Score: 22    End of Session Equipment Utilized During Treatment: Gait belt Activity Tolerance: Patient tolerated treatment well Patient left: in chair;with call bell/phone within reach;with chair alarm set Nurse Communication: Mobility status PT Visit Diagnosis: Unsteadiness on feet (R26.81);Other abnormalities of gait and mobility (R26.89)     Time: 1131-1155 PT Time Calculation (min) (ACUTE ONLY): 24 min  Charges:    $Gait Training: 8-22 mins $Therapeutic Activity: 8-22 mins PT General Charges $$ ACUTE PT VISIT: 1 Visit                     Sabra Morel, PT, DPT  Acute Rehabilitation  Services         Office: [phone]      Sabra MARLA Morel 06/01/2024, 1:06 PM

## 2024-06-01 NOTE — Assessment & Plan Note (Signed)
 Likely aspiration pneumonia from nausea vomiting prior to admission, switch Rocephin  to Unasyn , continue azithromycin , no signs of ongoing aspiration, monitor cultures.

## 2024-06-01 NOTE — Plan of Care (Signed)
" °  Problem: Education: Goal: Knowledge of General Education information will improve Description: Including pain rating scale, medication(s)/side effects and non-pharmacologic comfort measures Outcome: Progressing   Problem: Clinical Measurements: Goal: Ability to maintain clinical measurements within normal limits will improve Outcome: Progressing Goal: Respiratory complications will improve Outcome: Progressing   Problem: Safety: Goal: Ability to remain free from injury will improve Outcome: Progressing   Problem: Activity: Goal: Ability to tolerate increased activity will improve Outcome: Progressing   Problem: Clinical Measurements: Goal: Ability to maintain a body temperature in the normal range will improve Outcome: Progressing   Problem: Respiratory: Goal: Ability to maintain adequate ventilation will improve Outcome: Progressing   Problem: Metabolic: Goal: Ability to maintain appropriate glucose levels will improve Outcome: Progressing   "

## 2024-06-01 NOTE — Assessment & Plan Note (Signed)
 3 small Mallory-Weiss tear, multiple episodes of nausea vomiting at home, none here, on IV PPI monitor.

## 2024-06-01 NOTE — Assessment & Plan Note (Signed)
 Treatment as above

## 2024-06-01 NOTE — Assessment & Plan Note (Signed)
 CKD stage IIIb baseline creatinine appears to be around 1.2, renal function significantly worse, continue hydration with IV fluids, stable renal ultrasound, avoid nephrotoxins and monitor with ongoing IV fluids for hydration.

## 2024-06-01 NOTE — Assessment & Plan Note (Signed)
 Due to DKA resolved.

## 2024-06-01 NOTE — Progress Notes (Signed)
 "                                                                                                                                                                                                                                                                                PROGRESS NOTE     Patient Demographics:    Christian Navarro, is a 63 y.o. male, DOB - 06-Aug-1960, FMW:[id number]  Outpatient Primary MD for the patient is Theotis Haze ORN, NP    LOS - 3  Admit date - 05/28/2024    Chief Complaint  Patient presents with   Abdominal Pain       Brief Narrative (HPI from H&P)   63 year old African male with a history of diabetes, hypertension, mixed hyperlipidemia, tobacco abuse, presents to the ER with a 4 to 5-day history of nausea, vomiting and abdominal pain. He states he has not eaten anything in the last 3 to 5 days. He has had some nausea. Occasional diarrhea. He thinks he may have seen some blood in his vomit. He has been febrile at home and coughing. Due to his inability to eat or drink anything, he has not taken his medications in several days.   In the ER his workup was consistent with DKA, right lower lobe pneumonia likely aspiration from ongoing nausea vomiting and he was admitted to the hospital.   Subjective:   Patient in bed, appears comfortable, denies any headache, no fever, no chest pain or pressure, no shortness of breath , no abdominal pain. No focal weakness.   Assessment  & Plan :   Assessment & Plan DKA (diabetic ketoacidosis) (HCC) Treated with DKA protocol, anion gap closed, switch to subcu Lantus  and sliding scale, dose adjusted further 05/31/2024 for better control, continue to monitor diabetic education ordered.  To her outpatient glycemic control as suggested by extremely elevated A1c  Lab Results  Component Value Date   HGBA1C 11.0 (H) 05/29/2024   CBG (last 3)  Recent Labs    05/31/24 1610 05/31/24 2117 06/01/24 0816  GLUCAP 70 260* 188*    Lobar  pneumonia - RLL Likely aspiration pneumonia from nausea vomiting prior to admission, switch Rocephin  to Unasyn ,  continue azithromycin , no signs of ongoing aspiration, monitor cultures.  Sepsis with acute renal failure without septic shock (HCC) Treatment as above  AKI (acute kidney injury) CKD stage IIIb baseline creatinine appears to be around 1.2, renal function significantly worse, continue hydration with IV fluids, stable renal ultrasound, avoid nephrotoxins and monitor with ongoing IV fluids for hydration.  Hyponatremia Due to DKA improved after hydration  Hyperkalemia Due to DKA resolved Hematemesis 3 small Mallory-Weiss tear, multiple episodes of nausea vomiting at home, none here, on IV PPI monitor.  Obesity:  Estimated body mass index is 30.79, follow-up with PCP.        Condition -   Guarded  Family Communication  : None present  Code Status : Full code  Consults  : Diabetic educator  PUD Prophylaxis : PPI   Procedures  :     CT abdomen and pelvis.  Right lower lobe pneumonia.  No acute abdomen findings.      Disposition Plan  :    Status is: Inpatient  DVT Prophylaxis  :    SCDs Start: 05/29/24 1849 Place and maintain sequential compression device Start: 05/29/24 1649 SCDs Start: 05/29/24 1614    Lab Results  Component Value Date   PLT 280 06/01/2024    Diet :  Diet Order             Diet heart healthy/carb modified Room service appropriate? Yes; Fluid consistency: Thin  Diet effective now                    Inpatient Medications  Scheduled Meds:  atorvastatin   40 mg Oral Daily   azithromycin   500 mg Oral Daily   fenofibrate   160 mg Oral Daily   gabapentin   100 mg Oral TID   insulin  aspart  0-20 Units Subcutaneous TID WC   insulin  aspart  0-5 Units Subcutaneous QHS   insulin  aspart  3 Units Subcutaneous TID WC   insulin  glargine  28 Units Subcutaneous BID   nicotine   21 mg Transdermal Daily   pantoprazole  (PROTONIX ) IV  40 mg  Intravenous Q12H   Continuous Infusions:  ampicillin -sulbactam (UNASYN ) IV 3 g (06/01/24 0830)   lactated ringers  75 mL/hr at 06/01/24 0559   PRN Meds:.acetaminophen , dextrose , guaiFENesin -dextromethorphan , ipratropium-albuterol      Objective:   Vitals:   06/01/24 0042 06/01/24 0304 06/01/24 0503 06/01/24 0816  BP: (!) 143/64  (!) 150/51 (!) 143/53  Pulse: 91  97 94  Resp: 18  20 20   Temp: 98.8 F (37.1 C)  100 F (37.8 C) 100.1 F (37.8 C)  TempSrc: Oral  Oral Oral  SpO2:  95% 94%   Weight:      Height:        Wt Readings from Last 3 Encounters:  05/29/24 83.9 kg  05/09/24 85.7 kg  04/09/24 84.4 kg     Intake/Output Summary (Last 24 hours) at 06/01/2024 0958 Last data filed at 06/01/2024 0600 Gross per 24 hour  Intake 2768.71 ml  Output --  Net 2768.71 ml      Physical Exam  Awake Alert, No new F.N deficits, Normal affect [place].AT,PERRAL Supple Neck, No JVD,   Symmetrical Chest wall movement, Good air movement bilaterally, CTAB RRR,No Gallops,Rubs or new Murmurs,  +ve B.Sounds, Abd Soft, No tenderness,   No Cyanosis, Clubbing or edema     Data Review:    Recent Labs  Lab 05/28/24 1214 05/28/24 1512 05/29/24 1743 05/30/24 0326 05/31/24 0402 06/01/24 0252  WBC 17.1*  --  13.2* 13.1* 11.6* 9.3  HGB 12.3* 13.3 11.2* 10.8* 10.9* 10.7*  HCT 36.5* 39.0 32.7* 30.9* 32.3* 31.5*  PLT 160  --  185 212 231 280  MCV 85.1  --  87.0 84.9 85.2 85.8  MCH 28.7  --  29.8 29.7 28.8 29.2  MCHC 33.7  --  34.3 35.0 33.7 34.0  RDW 12.0  --  12.3 12.4 12.6 12.7  LYMPHSABS  --   --  1.4 1.7 1.4 1.6  MONOABS  --   --  1.1* 1.0 1.2* 0.9  EOSABS  --   --  0.0 0.1 0.2 0.3  BASOSABS  --   --  0.0 0.0 0.0 0.0    Recent Labs  Lab 05/28/24 1214 05/28/24 1512 05/29/24 0322 05/29/24 0335 05/29/24 1743 05/29/24 1744 05/29/24 2006 05/30/24 0326 05/30/24 0703 05/30/24 1944 05/31/24 0402 06/01/24 0252  NA 124*   < > 129*  --   --    < >  --  130* 132* 131* 133* 137   K 5.2*   < > 3.9  --   --    < >  --  4.0 3.9 3.7 3.8 3.9  CL 84*  --  94*  --   --    < >  --  96* 97* 96* 99 102  CO2 16*  --  24  --   --    < >  --  22 23 22 23 22   ANIONGAP 24*  --  11  --   --    < >  --  12 12 13 11 12   GLUCOSE 459*  --  164*  --   --    < >  --  159* 124* 306* 246* 260*  BUN 37*  --  39*  --   --    < >  --  54* 53* 52* 49* 43*  CREATININE 1.67*  --  1.60*  --   --    < >  --  3.03* 3.09* 3.30* 3.21* 2.91*  AST 22  --   --   --   --   --   --  38  --   --  45* 49*  ALT 12  --   --   --   --   --   --  28  --   --  42 49*  ALKPHOS 90  --   --   --   --   --   --  109  --   --  146* 170*  BILITOT 0.6  --   --   --   --   --   --  0.2  --   --  0.3 0.3  ALBUMIN 3.5  --   --   --   --   --   --  3.1*  --   --  3.0* 2.9*  CRP  --   --   --   --  34.4*  --   --  32.7*  --   --  28.5* 23.8*  PROCALCITON  --   --   --   --  2.91  --   --  2.92  --   --  1.75 1.00  LATICACIDVEN  --   --  1.2  --  1.5  --  1.5  --   --   --   --   --  HGBA1C  --   --   --  11.0*  --   --   --   --   --   --   --   --   MG  --   --   --   --  2.2  --   --  2.3  --   --  2.4 2.2  PHOS  --   --   --   --  3.5  --   --  1.9*  --   --  3.3 3.3  CALCIUM  9.2  --  8.5*  --   --    < >  --  8.4* 8.3* 8.3* 8.5* 8.6*   < > = values in this interval not displayed.      Recent Labs  Lab 05/29/24 0322 05/29/24 0335 05/29/24 1743 05/29/24 1744 05/29/24 2006 05/30/24 0326 05/30/24 0703 05/30/24 1944 05/31/24 0402 06/01/24 0252  CRP  --   --  34.4*  --   --  32.7*  --   --  28.5* 23.8*  PROCALCITON  --   --  2.91  --   --  2.92  --   --  1.75 1.00  LATICACIDVEN 1.2  --  1.5  --  1.5  --   --   --   --   --   HGBA1C  --  11.0*  --   --   --   --   --   --   --   --   MG  --   --  2.2  --   --  2.3  --   --  2.4 2.2  CALCIUM  8.5*  --   --    < >  --  8.4* 8.3* 8.3* 8.5* 8.6*   < > = values in this interval not displayed.     --------------------------------------------------------------------------------------------------------------- Lab Results  Component Value Date   CHOL 187 07/04/2023   HDL 33 (L) 07/04/2023   LDLCALC 129 (H) 07/04/2023   LDLDIRECT 94.7 05/25/2013   TRIG 140 07/04/2023   CHOLHDL 5.7 (H) 07/04/2023    Lab Results  Component Value Date   HGBA1C 11.0 (H) 05/29/2024    Radiology Report US  RENAL Result Date: 05/30/2024 EXAM: US  Retroperitoneum Complete, Renal. 05/30/2024 09:31:00 PM TECHNIQUE: Real-time ultrasonography of the retroperitoneum renal was performed. COMPARISON: US  Retroperitoneum 05/25/2021. CLINICAL HISTORY: AKI (acute kidney injury). FINDINGS: FINDINGS: RIGHT KIDNEY/URETER: Right kidney measures 12.3 x 5.6 x 5.6 cm. Calculated volume: 202 ml. Normal cortical echogenicity. No hydronephrosis. No calculus. No mass. LEFT KIDNEY/URETER: Left kidney measures 5.6 x 3.5 x 4.2 cm. Calculated volume: 147 ml. A 5.6 cm simple cyst is noted in the upper pole of the left kidney. No follow-up is needed Normal cortical echogenicity. No hydronephrosis. No calculus. No mass. BLADDER: Unremarkable appearance of the bladder. IMPRESSION: 1. No acute findings. Electronically signed by: Oneil Devonshire MD 05/30/2024 11:42 PM EST RP Workstation: [id number]   DG Chest Port 1 View Result Date: 05/30/2024 EXAM: 1 VIEW(S) XRAY OF THE CHEST 05/30/2024 09:07:00 PM COMPARISON: 05/30/2024 CLINICAL HISTORY: Chest pain FINDINGS: LUNGS AND PLEURA: Increasing opacification in the right lung base is noted when compared with the prior exam. Stable vascular congestion and mild edema are seen. No pleural effusion. No pneumothorax. HEART AND MEDIASTINUM: No acute abnormality of the cardiac and mediastinal silhouettes. BONES AND SOFT TISSUES: No acute osseous abnormality. IMPRESSION: 1. Increasing opacification in the right lung base compared to the prior  exam. 2. Stable vascular congestion and mild edema. Electronically  signed by: Oneil Devonshire MD 05/30/2024 11:41 PM EST RP Workstation: [id number]     Signature  -   Lavada Stank M.D on 06/01/2024 at 9:58 AM   -  To page go to [email or web address]   "

## 2024-06-01 NOTE — Assessment & Plan Note (Signed)
 Due to DKA improved after hydration

## 2024-06-02 ENCOUNTER — Other Ambulatory Visit (HOSPITAL_COMMUNITY): Payer: Self-pay

## 2024-06-02 ENCOUNTER — Other Ambulatory Visit: Payer: Self-pay | Admitting: Nurse Practitioner

## 2024-06-02 DIAGNOSIS — E1110 Type 2 diabetes mellitus with ketoacidosis without coma: Secondary | ICD-10-CM | POA: Diagnosis not present

## 2024-06-02 DIAGNOSIS — Z1211 Encounter for screening for malignant neoplasm of colon: Secondary | ICD-10-CM

## 2024-06-02 DIAGNOSIS — J181 Lobar pneumonia, unspecified organism: Secondary | ICD-10-CM

## 2024-06-02 LAB — CBC WITH DIFFERENTIAL/PLATELET
Abs Immature Granulocytes: 0.36 K/uL — ABNORMAL HIGH (ref 0.00–0.07)
Basophils Absolute: 0.1 K/uL (ref 0.0–0.1)
Basophils Relative: 1 %
Eosinophils Absolute: 0.4 K/uL (ref 0.0–0.5)
Eosinophils Relative: 5 %
HCT: 31.3 % — ABNORMAL LOW (ref 39.0–52.0)
Hemoglobin: 10.5 g/dL — ABNORMAL LOW (ref 13.0–17.0)
Immature Granulocytes: 4 %
Lymphocytes Relative: 24 %
Lymphs Abs: 2 K/uL (ref 0.7–4.0)
MCH: 28.8 pg (ref 26.0–34.0)
MCHC: 33.5 g/dL (ref 30.0–36.0)
MCV: 85.8 fL (ref 80.0–100.0)
Monocytes Absolute: 0.8 K/uL (ref 0.1–1.0)
Monocytes Relative: 10 %
Neutro Abs: 4.6 K/uL (ref 1.7–7.7)
Neutrophils Relative %: 56 %
Platelets: 346 K/uL (ref 150–400)
RBC: 3.65 MIL/uL — ABNORMAL LOW (ref 4.22–5.81)
RDW: 12.7 % (ref 11.5–15.5)
WBC: 8.3 K/uL (ref 4.0–10.5)
nRBC: 0 % (ref 0.0–0.2)

## 2024-06-02 LAB — COMPREHENSIVE METABOLIC PANEL WITH GFR
ALT: 32 U/L (ref 0–44)
AST: 26 U/L (ref 15–41)
Albumin: 2.9 g/dL — ABNORMAL LOW (ref 3.5–5.0)
Alkaline Phosphatase: 125 U/L (ref 38–126)
Anion gap: 9 (ref 5–15)
BUN: 23 mg/dL (ref 8–23)
CO2: 28 mmol/L (ref 22–32)
Calcium: 8.8 mg/dL — ABNORMAL LOW (ref 8.9–10.3)
Chloride: 103 mmol/L (ref 98–111)
Creatinine, Ser: 1.7 mg/dL — ABNORMAL HIGH (ref 0.61–1.24)
GFR, Estimated: 45 mL/min — ABNORMAL LOW
Glucose, Bld: 93 mg/dL (ref 70–99)
Potassium: 3.6 mmol/L (ref 3.5–5.1)
Sodium: 140 mmol/L (ref 135–145)
Total Bilirubin: 0.3 mg/dL (ref 0.0–1.2)
Total Protein: 6.4 g/dL — ABNORMAL LOW (ref 6.5–8.1)

## 2024-06-02 LAB — GLUCOSE, CAPILLARY
Glucose-Capillary: 67 mg/dL — ABNORMAL LOW (ref 70–99)
Glucose-Capillary: 88 mg/dL (ref 70–99)

## 2024-06-02 MED ORDER — CARVEDILOL 3.125 MG PO TABS
3.1250 mg | ORAL_TABLET | Freq: Two times a day (BID) | ORAL | 11 refills | Status: AC
  Filled 2024-06-02: qty 60, 30d supply, fill #0

## 2024-06-02 MED ORDER — INSULIN GLARGINE 100 UNIT/ML ~~LOC~~ SOLN
25.0000 [IU] | Freq: Two times a day (BID) | SUBCUTANEOUS | Status: DC
  Administered 2024-06-02: 25 [IU] via SUBCUTANEOUS
  Filled 2024-06-02 (×2): qty 0.25

## 2024-06-02 MED ORDER — ACETAMINOPHEN 500 MG PO TABS
500.0000 mg | ORAL_TABLET | Freq: Three times a day (TID) | ORAL | 0 refills | Status: AC | PRN
  Filled 2024-06-02: qty 20, 7d supply, fill #0

## 2024-06-02 MED ORDER — AZITHROMYCIN 500 MG PO TABS
500.0000 mg | ORAL_TABLET | Freq: Every day | ORAL | 0 refills | Status: AC
  Filled 2024-06-02: qty 5, 5d supply, fill #0

## 2024-06-02 MED ORDER — INSULIN GLARGINE 100 UNIT/ML ~~LOC~~ SOLN: 25.0000 [IU] | Freq: Every day | SUBCUTANEOUS | Status: AC

## 2024-06-02 MED ORDER — PANTOPRAZOLE SODIUM 40 MG PO TBEC
40.0000 mg | DELAYED_RELEASE_TABLET | Freq: Every day | ORAL | 1 refills | Status: AC
  Filled 2024-06-02: qty 30, 30d supply, fill #0

## 2024-06-02 MED ORDER — NICOTINE 21 MG/24HR TD PT24
21.0000 mg | MEDICATED_PATCH | Freq: Every day | TRANSDERMAL | 0 refills | Status: AC
  Filled 2024-06-02: qty 28, 28d supply, fill #0
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
## 2024-06-02 NOTE — Plan of Care (Signed)
  Problem: Education: Goal: Knowledge of General Education information will improve Description: Including pain rating scale, medication(s)/side effects and non-pharmacologic comfort measures Outcome: Progressing   Problem: Clinical Measurements: Goal: Respiratory complications will improve Outcome: Progressing Goal: Cardiovascular complication will be avoided Outcome: Progressing   Problem: Activity: Goal: Risk for activity intolerance will decrease Outcome: Progressing   Problem: Pain Managment: Goal: General experience of comfort will improve and/or be controlled Outcome: Progressing

## 2024-06-02 NOTE — Discharge Instructions (Signed)
 Follow with Primary MD Theotis Haze ORN, NP in 7 days   Get CBC, CMP, Magnesium, 2 view Chest X ray -  checked next visit with your primary MD   Activity: As tolerated with Full fall precautions use walker/cane & assistance as needed  Disposition Home    Diet: Heart Healthy low carbohydrate-check CBGs q. ACHS, show the results to PCP in a week.  Special Instructions: If you have smoked or chewed Tobacco  in the last 2 yrs please stop smoking, stop any regular Alcohol  and or any Recreational drug use.  On your next visit with your primary care physician please Get Medicines reviewed and adjusted.  Please request your Prim.MD to go over all Hospital Tests and Procedure/Radiological results at the follow up, please get all Hospital records sent to your Prim MD by signing hospital release before you go home.  If you experience worsening of your admission symptoms, develop shortness of breath, life threatening emergency, suicidal or homicidal thoughts you must seek medical attention immediately by calling 911 or calling your MD immediately  if symptoms less severe.  You Must read complete instructions/literature along with all the possible adverse reactions/side effects for all the Medicines you take and that have been prescribed to you. Take any new Medicines after you have completely understood and accpet all the possible adverse reactions/side effects.   Do not drive when taking Pain medications.  Do not take more than prescribed Pain, Sleep and Anxiety Medications  Wear Seat belts while driving.

## 2024-06-03 LAB — CULTURE, BLOOD (ROUTINE X 2)
Culture: NO GROWTH
Culture: NO GROWTH
Special Requests: ADEQUATE
Special Requests: ADEQUATE

## 2024-06-04 ENCOUNTER — Telehealth: Payer: Self-pay | Admitting: *Deleted

## 2024-06-04 ENCOUNTER — Telehealth: Payer: Self-pay

## 2024-06-04 NOTE — Transitions of Care (Post Inpatient/ED Visit) (Signed)
" ° °  06/04/2024  Name: Christian Navarro MRN: [id number] DOB: 1961/03/20  Today's TOC FU Call Status: Today's TOC FU Call Status:: Unsuccessful Call (1st Attempt) Unsuccessful Call (1st Attempt) Date: 06/04/24  Attempted to reach the patient regarding the most recent Inpatient/ED visit.  Follow Up Plan: Additional outreach attempts will be made to reach the patient to complete the Transitions of Care (Post Inpatient/ED visit) call.   Andrea Dimes RN, BSN [place]  [facility] RN Care Manager [phone]  "

## 2024-06-04 NOTE — Telephone Encounter (Signed)
-----   Message from Haze Servant, NP sent at 06/02/2024 10:00 AM EST ----- Regarding: follow up [name] he has an appt with you on the 29th. Can you make sure he gets his labs done? Thank you.   Christian Navarro please call him and give him the number for [facility] to schedule his chest CT. Thank you.

## 2024-06-04 NOTE — Telephone Encounter (Signed)
 Patient identified by name and date of birth.  Patient aware of providers response and voiced understanding.  Unable to give patient phone number to schedule [facility] at [facility].  Patient did not have a pen and will call back to write down number. I did advise patient that he could go to his text messages and just type the number just so he could have it, but patient declined.   [facility] [address]  [address] [place], [place] [address]  Phone: [phone]

## 2024-06-04 NOTE — Transitions of Care (Post Inpatient/ED Visit) (Signed)
" ° °  06/04/2024  Name: Christian Navarro MRN: [id number] DOB: 1960-09-27  Today's TOC FU Call Status: Today's TOC FU Call Status:: Successful TOC FU Call Completed TOC FU Call Complete Date: 06/04/24  Patient's Name and Date of Birth confirmed. Name, DOB  Transition Care Management Follow-up Telephone Call Date of Discharge: 06/02/24 Discharge Facility: [facility]) Type of Discharge: Inpatient Admission Primary Inpatient Discharge Diagnosis:: DKA How have you been since you were released from the hospital?: Better Any questions or concerns?: No  Items Reviewed: Did you receive and understand the discharge instructions provided?: Yes Medications obtained,verified, and reconciled?: Yes (Medications Reviewed) Any new allergies since your discharge?: No Dietary orders reviewed?: Yes Type of Diet Ordered:: Heart Healthy Low Carb Do you have support at home?: Yes People in Home [RPT]: spouse, child(ren), adult Name of Support/Comfort Primary Source: Wife and daughters  Medications Reviewed Today: Medications Reviewed Today   Medications were not reviewed in this encounter     Home Care and Equipment/Supplies: Were Home Health Services Ordered?: No Any new equipment or medical supplies ordered?: No  Functional Questionnaire: Do you need assistance with bathing/showering or dressing?: No Do you need assistance with meal preparation?: No Do you need assistance with eating?: No Do you have difficulty maintaining continence: No Do you need assistance with getting out of bed/getting out of a chair/moving?: No Do you have difficulty managing or taking your medications?: No  Follow up appointments reviewed: PCP Follow-up appointment confirmed?: No (RNCM assisted with scheduling hospital follow up on 06/21/24 at 8:30am) Specialist Hospital Follow-up appointment confirmed?: NA Do you need transportation to your follow-up appointment?: No Do you understand care options if your  condition(s) worsen?: Yes-patient verbalized understanding  SDOH Interventions Today    Flowsheet Row Most Recent Value  SDOH Interventions   Food Insecurity Interventions Intervention Not Indicated  Transportation Interventions Intervention Not Indicated  Utilities Interventions Intervention Not Indicated    Andrea Dimes RN, BSN [place]  [facility] RN Care Manager [phone]  "

## 2024-06-08 ENCOUNTER — Emergency Department (HOSPITAL_BASED_OUTPATIENT_CLINIC_OR_DEPARTMENT_OTHER)
Admission: EM | Admit: 2024-06-08 | Discharge: 2024-06-08 | Disposition: A | Discharge status: Home / Self Care | Source: Home / Self Care

## 2024-06-08 ENCOUNTER — Emergency Department (HOSPITAL_BASED_OUTPATIENT_CLINIC_OR_DEPARTMENT_OTHER)
Admission: EM | Admit: 2024-06-08 | Discharge: 2024-06-08 | Discharge status: Against Medical Advice | Attending: Emergency Medicine | Admitting: Emergency Medicine

## 2024-06-08 ENCOUNTER — Emergency Department (HOSPITAL_BASED_OUTPATIENT_CLINIC_OR_DEPARTMENT_OTHER): Admitting: Radiology

## 2024-06-08 ENCOUNTER — Telehealth: Payer: Self-pay

## 2024-06-08 DIAGNOSIS — J101 Influenza due to other identified influenza virus with other respiratory manifestations: Secondary | ICD-10-CM | POA: Insufficient documentation

## 2024-06-08 DIAGNOSIS — Z5321 Procedure and treatment not carried out due to patient leaving prior to being seen by health care provider: Secondary | ICD-10-CM | POA: Insufficient documentation

## 2024-06-08 LAB — RESP PANEL BY RT-PCR (RSV, FLU A&B, COVID)  RVPGX2
Influenza A by PCR: NEGATIVE
Influenza B by PCR: POSITIVE — AB
Resp Syncytial Virus by PCR: NEGATIVE
SARS Coronavirus 2 by RT PCR: NEGATIVE

## 2024-06-08 NOTE — ED Triage Notes (Signed)
 Reports cough, congestion, fever, and shob x 4 days. Entire family is symptomatic.

## 2024-06-08 NOTE — Telephone Encounter (Signed)
 Unable to reach patient by phone. Voicemail left explaining canceled appointment and the need to call [facility] to schedule CT. Insurance is in network with [facility].

## 2024-06-09 ENCOUNTER — Ambulatory Visit (HOSPITAL_BASED_OUTPATIENT_CLINIC_OR_DEPARTMENT_OTHER)

## 2024-06-10 ENCOUNTER — Emergency Department (HOSPITAL_BASED_OUTPATIENT_CLINIC_OR_DEPARTMENT_OTHER)

## 2024-06-10 ENCOUNTER — Encounter (HOSPITAL_BASED_OUTPATIENT_CLINIC_OR_DEPARTMENT_OTHER): Payer: Self-pay | Admitting: Emergency Medicine

## 2024-06-10 ENCOUNTER — Emergency Department (HOSPITAL_BASED_OUTPATIENT_CLINIC_OR_DEPARTMENT_OTHER)
Admission: EM | Admit: 2024-06-10 | Discharge: 2024-06-10 | Disposition: A | Discharge status: Home / Self Care | Attending: Emergency Medicine | Admitting: Emergency Medicine

## 2024-06-10 DIAGNOSIS — Z794 Long term (current) use of insulin: Secondary | ICD-10-CM | POA: Diagnosis not present

## 2024-06-10 DIAGNOSIS — J189 Pneumonia, unspecified organism: Secondary | ICD-10-CM

## 2024-06-10 DIAGNOSIS — Z7982 Long term (current) use of aspirin: Secondary | ICD-10-CM | POA: Insufficient documentation

## 2024-06-10 DIAGNOSIS — J101 Influenza due to other identified influenza virus with other respiratory manifestations: Secondary | ICD-10-CM | POA: Diagnosis not present

## 2024-06-10 DIAGNOSIS — Z79899 Other long term (current) drug therapy: Secondary | ICD-10-CM | POA: Insufficient documentation

## 2024-06-10 DIAGNOSIS — E1165 Type 2 diabetes mellitus with hyperglycemia: Secondary | ICD-10-CM | POA: Diagnosis not present

## 2024-06-10 DIAGNOSIS — R059 Cough, unspecified: Secondary | ICD-10-CM | POA: Diagnosis present

## 2024-06-10 DIAGNOSIS — I959 Hypotension, unspecified: Secondary | ICD-10-CM

## 2024-06-10 DIAGNOSIS — I10 Essential (primary) hypertension: Secondary | ICD-10-CM | POA: Insufficient documentation

## 2024-06-10 LAB — RESP PANEL BY RT-PCR (RSV, FLU A&B, COVID)  RVPGX2
Influenza A by PCR: NEGATIVE
Influenza B by PCR: POSITIVE — AB
Resp Syncytial Virus by PCR: NEGATIVE
SARS Coronavirus 2 by RT PCR: NEGATIVE

## 2024-06-10 LAB — CBG MONITORING, ED: Glucose-Capillary: 169 mg/dL — ABNORMAL HIGH (ref 70–99)

## 2024-06-10 MED ORDER — DOXYCYCLINE HYCLATE 100 MG PO CAPS: 100.0000 mg | ORAL_CAPSULE | Freq: Two times a day (BID) | ORAL | 0 refills | Status: AC

## 2024-06-10 MED ORDER — DOXYCYCLINE HYCLATE 100 MG PO TABS
100.0000 mg | ORAL_TABLET | Freq: Once | ORAL | Status: AC
  Administered 2024-06-10: 100 mg via ORAL
  Filled 2024-06-10: qty 1

## 2024-06-10 NOTE — ED Provider Notes (Signed)
 " [facility] Provider Note   CSN: [id number] Arrival date & time: 06/10/24  1601     Patient presents with: No chief complaint on file.   Christian Navarro is a 63 y.o. male history of diabetes, recent admission for pneumonia, hypertension here presenting with cough and congestion.  Symptoms going on for about 4 days.  Multiple family members are sick as well.  Patient recently admitted for DKA and pneumonia.  He finished a course of azithromycin .   HPI     Prior to Admission medications  Medication Sig Start Date End Date Taking? Authorizing Provider  Accu-Chek Softclix Lancets lancets Use to check blood sugar 3 times daily.Must have office visit for refills 12/05/23   Newlin, Enobong, MD  acetaminophen  (TYLENOL ) 500 MG tablet Take 1 tablet (500 mg total) by mouth every 8 (eight) hours as needed for mild pain (pain score 1-3) or headache. 06/02/24 06/02/25  Dennise Lavada POUR, MD  aspirin  EC 81 MG tablet Take 1 tablet (81 mg total) by mouth daily. 03/30/18   Danton Jon CHRISTELLA, PA-C  atorvastatin  (LIPITOR) 40 MG tablet Take 1 tablet (40 mg total) by mouth daily. 01/04/24   Fleming, Zelda W, NP  azithromycin  (ZITHROMAX ) 500 MG tablet Take 1 tablet (500 mg total) by mouth daily. 06/02/24   Singh, Prashant K, MD  carvedilol  (COREG ) 3.125 MG tablet Take 1 tablet (3.125 mg total) by mouth 2 (two) times daily. 06/02/24 06/02/25  Dennise Lavada POUR, MD  Continuous Glucose Sensor (DEXCOM G7 SENSOR) MISC Check blood glucose levels continuously  E11.65 z79.84 01/06/24   Delbert Clam, MD  fenofibrate  (TRICOR ) 145 MG tablet Take 1 tablet (145 mg total) by mouth daily. 09/19/23   Newlin, Enobong, MD  glucose blood (ACCU-CHEK GUIDE TEST) test strip Use to check blood sugar 3 times daily. 02/28/24   Newlin, Enobong, MD  insulin  glargine (LANTUS ) 100 UNIT/ML injection Inject 0.25 mLs (25 Units total) into the skin at bedtime. Patient taking differently: Inject 50  Units into the skin at bedtime. 06/02/24   Dennise Lavada POUR, MD  insulin  lispro (HUMALOG ) 100 UNIT/ML KwikPen Inject 40 Units into the skin 2 (two) times daily. 04/26/24   Newlin, Enobong, MD  Insulin  Pen Needle (TRUEPLUS PEN NEEDLES) 31G X 5 MM MISC Use as directed to administer insulin  04/26/24   Delbert Clam, MD  nicotine  (NICODERM CQ  - DOSED IN MG/24 HOURS) 21 mg/24hr patch Place 1 patch (21 mg total) onto the skin daily. Patient not taking: Reported on 06/04/2024 06/02/24   Singh, Prashant K, MD  pantoprazole  (PROTONIX ) 40 MG tablet Take 1 tablet (40 mg total) by mouth daily. 06/02/24   Singh, Prashant K, MD  Vitamin D , Ergocalciferol , (DRISDOL ) 1.25 MG (50000 UNIT) CAPS capsule Take 1 capsule (50,000 Units total) by mouth every 7 (seven) days. 04/10/24   Fleming, Zelda W, NP    Allergies: Patient has no known allergies.    Review of Systems  Constitutional:  Positive for chills.  Respiratory:  Positive for cough.   All other systems reviewed and are negative.   Updated Vital Signs BP (!) 97/56 (BP Location: Left Arm)   Pulse 84   Temp 98.2 F (36.8 C) (Oral)   Resp (!) 22   Ht 5' 5 (1.651 m)   Wt 84 kg   SpO2 100%   BMI 30.82 kg/m   Physical Exam Vitals and nursing note reviewed.  HENT:     Head: Normocephalic.  Nose: Nose normal.     Mouth/Throat:     Mouth: Mucous membranes are moist.  Eyes:     Extraocular Movements: Extraocular movements intact.     Pupils: Pupils are equal, round, and reactive to light.  Cardiovascular:     Rate and Rhythm: Normal rate.     Pulses: Normal pulses.     Heart sounds: Normal heart sounds.  Pulmonary:     Effort: Pulmonary effort is normal.     Comments: Crackles on the right base Abdominal:     General: Abdomen is flat.     Palpations: Abdomen is soft.  Musculoskeletal:        General: Normal range of motion.     Cervical back: Normal range of motion and neck supple.  Skin:    General: Skin is warm.     Capillary  Refill: Capillary refill takes less than 2 seconds.  Neurological:     General: No focal deficit present.     Mental Status: He is alert.  Psychiatric:        Mood and Affect: Mood normal.     (all labs ordered are listed, but only abnormal results are displayed) Labs Reviewed  RESP PANEL BY RT-PCR (RSV, FLU A&B, COVID)  [id number] - Abnormal; Notable for the following components:      Result Value   Influenza B by PCR POSITIVE (*)    All other components within normal limits  CBG MONITORING, ED - Abnormal; Notable for the following components:   Glucose-Capillary 169 (*)    All other components within normal limits    EKG: None  Radiology: DG Chest 2 View Result Date: 06/10/2024 CLINICAL DATA:  Cough and fever. EXAM: CHEST - 2 VIEW COMPARISON:  05/30/2024 FINDINGS: Improvement in right lung base opacity with mild patchy residual. Similar ill-defined retrocardiac opacity. The heart is normal in size. Mediastinal contours are normal. No pleural fluid or pneumothorax. No acute osseous findings. IMPRESSION: Improvement in right lung base opacity with mild patchy residual. Similar ill-defined retrocardiac opacity. Findings may represent residual pneumonia or scarring. Electronically Signed   By: Andrea Gasman M.D.   On: 06/10/2024 18:12     Procedures   Medications Ordered in the ED  doxycycline  (VIBRA -TABS) tablet 100 mg (100 mg Oral Given 06/10/24 1806)                                    Medical Decision Making Christian Navarro is a 63 y.o. male here presenting with a cough and congestion and weakness.  Patient is slightly hypotensive and appears dehydrated.  Patient is afebrile.  Patient's CBG is 169.  Patient is flu B+ and outside the window for Tamiflu.  Chest x-ray showed still residual pneumonia.  Will give course of doxycycline .  Told him to stay hydrated.   Problems Addressed: Community acquired pneumonia of right lower lobe of lung: acute illness or injury Influenza B:  acute illness or injury  Amount and/or Complexity of Data Reviewed Radiology: ordered.  Risk Prescription drug management.     Final diagnoses:  None    ED Discharge Orders     None          Patt Alm Macho, MD 06/10/24 1828  "

## 2024-06-10 NOTE — ED Triage Notes (Signed)
 Pt states had Pneumonia and was admitted to [facility]. Today has body URI Sx. X few days.

## 2024-06-10 NOTE — Discharge Instructions (Signed)
 As we discussed, you have flu and pneumonia  I have prescribed doxycycline  twice daily for a week  Your blood pressure is slightly low and please stay hydrated and recheck your blood pressure with your doctor this week  Return to ER if you have worse cough or fever or passing out or glucose greater than 600 or less than 60

## 2024-06-11 ENCOUNTER — Ambulatory Visit: Admitting: Pharmacist

## 2024-06-11 ENCOUNTER — Other Ambulatory Visit: Payer: Self-pay

## 2024-06-12 LAB — LEGIONELLA PNEUMOPHILA SEROGP 1 UR AG: L. pneumophila Serogp 1 Ur Ag: POSITIVE — AB

## 2024-06-19 ENCOUNTER — Encounter: Payer: Self-pay | Admitting: Nurse Practitioner

## 2024-06-21 ENCOUNTER — Encounter: Payer: Self-pay | Admitting: *Deleted

## 2024-06-21 ENCOUNTER — Other Ambulatory Visit: Payer: Self-pay

## 2024-06-21 ENCOUNTER — Ambulatory Visit: Payer: Self-pay | Attending: *Deleted | Admitting: *Deleted

## 2024-06-21 VITALS — BP 113/62 | HR 75 | Ht 65.0 in | Wt 187.2 lb

## 2024-06-21 DIAGNOSIS — E119 Type 2 diabetes mellitus without complications: Secondary | ICD-10-CM

## 2024-06-21 DIAGNOSIS — E785 Hyperlipidemia, unspecified: Secondary | ICD-10-CM

## 2024-06-21 DIAGNOSIS — E0842 Diabetes mellitus due to underlying condition with diabetic polyneuropathy: Secondary | ICD-10-CM

## 2024-06-21 DIAGNOSIS — I952 Hypotension due to drugs: Secondary | ICD-10-CM

## 2024-06-21 MED ORDER — ATORVASTATIN CALCIUM 40 MG PO TABS
40.0000 mg | ORAL_TABLET | Freq: Every day | ORAL | 1 refills | Status: AC
  Filled 2024-06-21: qty 90, 90d supply, fill #0

## 2024-06-21 MED ORDER — ACCU-CHEK SOFTCLIX LANCETS MISC
0 refills | Status: AC
  Filled 2024-06-21: qty 100, 33d supply, fill #0

## 2024-06-21 MED ORDER — GABAPENTIN 100 MG PO CAPS
200.0000 mg | ORAL_CAPSULE | Freq: Three times a day (TID) | ORAL | 3 refills | Status: AC
  Filled 2024-06-21: qty 180, 30d supply, fill #0

## 2024-06-21 NOTE — Patient Instructions (Addendum)
 Today we discussed your  recent hospital visit. Glad you are feeling better. You reported that carvedilol  makes you dizzy and drop your blood pressure too low.  Okay to hold this for now. You are having more pain and numbness in your hands that is probably related to your diabetes. You will increase your gabapentin  to 200 3 times a day. You were started on pantoprazole  while in the hospital and that certainly fine to hold and use as needed for reflux symptoms. See your primary care provider in follow-up. See attached information about lung  cancer screening  [facility] [address] for lung cancer screening.  [address] [place], [place] [address]  Ph: [phone]

## 2024-06-21 NOTE — Progress Notes (Signed)
 "   Patient ID: Christian Navarro, male    DOB: [birth year]  MRN: [id number]  CC: Hospitalization Follow-up   Subjective: Christian Navarro is a 64 y.o. male who presents for hospital/ER follow-up.    Per EPIC review, he was in the hospital on 05/28/2024 with DKA and lobular pneumonia right lower lobe.  He was stabilized and discharged on azithromycin .  He was seen in the emergency room on 06/10/2004 with influenza B, community-acquired pneumonia right lower lobe of the lung and hypotension.  He was discharged on doxycycline  which he is still taking and tolerating.  Hypotension: He reports that the carvedilol  that he was started on during the hospitalization of 05/28/2024 causes his blood pressure to be too low and makes him dizzy. He stopped taking this medication due to the side effect.  He was given pantoprazole  for reflux symptoms during the hospitalization as well. He finds that he does not need that medication routinely and is holding it for now.  Diabetic neuropathy: He has noticed more pain and numbness in his hands despite gabapentin  100 mg 3 times a day. 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 He denies polyuria, polyphasia and polydipsia    His concerns today include: Hypotension (related to carvedilol ), diabetic polyneuropathy with worsening hand pain on low-dose gabapentin , GERD symptoms well-managed on as needed pantoprazole   CMA added refill orders for atorvastatin  and lancets per patient request  Patient Active Problem List   Diagnosis Date Noted   Lobar pneumonia - RLL 05/29/2024   Sepsis with acute renal failure without septic shock (HCC) 05/29/2024   AKI (acute kidney injury) 05/29/2024   Hyponatremia 05/29/2024   Hyperkalemia 05/29/2024   Hematemesis 05/29/2024   DKA (diabetic ketoacidosis) (HCC) 05/28/2024   Rotator cuff syndrome of  left shoulder 01/04/2024   Cervical radiculopathy 01/04/2024   Tobacco abuse disorder 12/22/2015   Type 2 diabetes mellitus with retinopathy of both eyes, with long-term current use of insulin , macular edema presence unspecified, unspecified retinopathy severity (HCC) 12/22/2012   DEGENERATIVE DISC DISEASE, LUMBOSACRAL SPINE 02/08/2007   Type 2 diabetes mellitus without complications (HCC) 02/06/2007   Hyperlipidemia 02/06/2007   KNEE PAIN 02/06/2007     Medications Ordered Prior to Encounter[1]  Allergies[2]  Social History   Socioeconomic History   Marital status: Married    Spouse name: Not on file   Number of children: Not on file   Years of education: Not on file   Highest education level: Not on file  Occupational History   Not on file  Tobacco Use   Smoking status: Every Day    Current packs/day: 1.00    Average packs/day: 1 pack/day for 46.3 years (46.3 ttl pk-yrs)    Types: Cigarettes    Start date: 03/07/1978   Smokeless tobacco: Never  Vaping Use   Vaping status: Former  Substance and Sexual Activity   Alcohol use: No   Drug use: No   Sexual activity: Yes  Other Topics Concern   Not on file  Social History Narrative   Not on file   Social Drivers of Health   Tobacco Use: High Risk (06/21/2024)   Patient History    Smoking Tobacco Use: Every Day    Smokeless Tobacco Use: Never    Passive Exposure: Not on file  Financial Resource Strain: Low Risk (07/04/2023)  Overall Financial Resource Strain (CARDIA)    Difficulty of Paying Living Expenses: Not hard at all  Food Insecurity: No Food Insecurity (06/04/2024)   Epic    Worried About Programme Researcher, Broadcasting/film/video in the Last Year: Never true    Ran Out of Food in the Last Year: Never true  Transportation Needs: No Transportation Needs (06/04/2024)   Epic    Lack of Transportation (Medical): No    Lack of Transportation (Non-Medical): No  Physical Activity: Inactive (07/04/2023)   Exercise Vital Sign    Days of  Exercise per Week: 0 days    Minutes of Exercise per Session: 0 min  Stress: No Stress Concern Present (07/04/2023)   [employer] of Occupational Health - Occupational Stress Questionnaire    Feeling of Stress : Only a little  Social Connections: Moderately Isolated (07/04/2023)   Social Connection and Isolation Panel    Frequency of Communication with Friends and Family: More than three times a week    Frequency of Social Gatherings with Friends and Family: More than three times a week    Attends Religious Services: Never    Database Administrator or Organizations: No    Attends Banker Meetings: Never    Marital Status: Married  Catering Manager Violence: Not At Risk (06/04/2024)   Epic    Fear of Current or Ex-Partner: No    Emotionally Abused: No    Physically Abused: No    Sexually Abused: No  Depression (PHQ2-9): Low Risk (06/04/2024)   Depression (PHQ2-9)    PHQ-2 Score: 0  Recent Concern: Depression (PHQ2-9) - Medium Risk (04/09/2024)   Depression (PHQ2-9)    PHQ-2 Score: 8  Alcohol Screen: Low Risk (07/04/2023)   Alcohol Screen    Last Alcohol Screening Score (AUDIT): 0  Housing: Unknown (06/04/2024)   Epic    Unable to Pay for Housing in the Last Year: No    Number of Times Moved in the Last Year: Not on file    Homeless in the Last Year: No  Utilities: Not At Risk (06/04/2024)   Epic    Threatened with loss of utilities: No  Health Literacy: Inadequate Health Literacy (07/04/2023)   B1300 Health Literacy    Frequency of need for help with medical instructions: Sometimes    History reviewed. No pertinent family history.  Past Surgical History:  Procedure Laterality Date   SHOULDER SURGERY      ROS: Review of Systems Negative except as stated above  PHYSICAL EXAM: BP 113/62 (BP Location: Left Arm, Patient Position: Sitting, Cuff Size: Normal)   Pulse 75   Ht 5' 5 (1.651 m)   Wt 187 lb 3.2 oz (84.9 kg)   SpO2 99%   BMI 31.15 kg/m    Physical Exam Vitals and nursing note reviewed.  Constitutional:      Appearance: Normal appearance.  Cardiovascular:     Rate and Rhythm: Normal rate and regular rhythm.  Pulmonary:     Effort: Pulmonary effort is normal.     Breath sounds: Normal breath sounds.     Comments: Pulse ox 99% on room air Abdominal:     Comments: Nondistended, bowel sounds present.  No masses organomegaly  Skin:    General: Skin is warm and dry.  Neurological:     Mental Status: He is alert and oriented to person, place, and time. Mental status is at baseline.          Latest Ref Rng &  Units 06/02/2024    7:47 AM 06/01/2024    2:52 AM 05/31/2024    4:02 AM  CMP  Glucose 70 - 99 mg/dL 93  739  753   BUN 8 - 23 mg/dL 23  43  49   Creatinine 0.61 - 1.24 mg/dL 8.29  7.08  6.78   Sodium 135 - 145 mmol/L 140  137  133   Potassium 3.5 - 5.1 mmol/L 3.6  3.9  3.8   Chloride 98 - 111 mmol/L 103  102  99   CO2 22 - 32 mmol/L 28  22  23    Calcium  8.9 - 10.3 mg/dL 8.8  8.6  8.5   Total Protein 6.5 - 8.1 g/dL 6.4  6.6  6.5   Total Bilirubin 0.0 - 1.2 mg/dL 0.3  0.3  0.3   Alkaline Phos 38 - 126 U/L 125  170  146   AST 15 - 41 U/L 26  49  45   ALT 0 - 44 U/L 32  49  42    Lipid Panel     Component Value Date/Time   CHOL 187 07/04/2023 1537   TRIG 140 07/04/2023 1537   HDL 33 (L) 07/04/2023 1537   CHOLHDL 5.7 (H) 07/04/2023 1537   CHOLHDL 6.5 (H) 04/05/2016 1056   VLDL 64 (H) 04/05/2016 1056   LDLCALC 129 (H) 07/04/2023 1537   LDLDIRECT 94.7 05/25/2013 1612    CBC    Component Value Date/Time   WBC 8.3 06/02/2024 0747   RBC 3.65 (L) 06/02/2024 0747   HGB 10.5 (L) 06/02/2024 0747   HGB 14.2 03/08/2023 1648   HCT 31.3 (L) 06/02/2024 0747   HCT 43.4 03/08/2023 1648   PLT 346 06/02/2024 0747   PLT 239 03/08/2023 1648   MCV 85.8 06/02/2024 0747   MCV 87 03/08/2023 1648   MCH 28.8 06/02/2024 0747   MCHC 33.5 06/02/2024 0747   RDW 12.7 06/02/2024 0747   RDW 13.7 03/08/2023 1648    LYMPHSABS 2.0 06/02/2024 0747   LYMPHSABS 2.9 03/08/2023 1648   MONOABS 0.8 06/02/2024 0747   EOSABS 0.4 06/02/2024 0747   EOSABS 0.2 03/08/2023 1648   BASOSABS 0.1 06/02/2024 0747   BASOSABS 0.0 03/08/2023 1648    Results for orders placed or performed during the hospital encounter of 06/10/24  Resp panel by RT-PCR (RSV, Flu A&B, Covid) Anterior Nasal Swab   Collection Time: 06/10/24  4:40 PM   Specimen: Anterior Nasal Swab  Result Value Ref Range   SARS Coronavirus 2 by RT PCR NEGATIVE NEGATIVE   Influenza A by PCR NEGATIVE NEGATIVE   Influenza B by PCR POSITIVE (A) NEGATIVE   Resp Syncytial Virus by PCR NEGATIVE NEGATIVE  CBG monitoring, ED   Collection Time: 06/10/24  5:13 PM  Result Value Ref Range   Glucose-Capillary 169 (H) 70 - 99 mg/dL     ASSESSMENT AND PLAN:  Assessment & Plan Dyslipidemia, goal LDL below 70 Medicines were reviewed today. He is taking his atorvastatin  as previously ordered and tolerating it without muscle pain, or weakness. Okay to refill this medication. He is encouraged to continue his heart healthy diet and exercise daily Orders:   atorvastatin  (LIPITOR) 40 MG tablet; Take 1 tablet (40 mg total) by mouth daily.  Diabetic polyneuropathy associated with diabetes mellitus due to underlying condition [facility]) He reports increasing hand pain and numbness despite being on gabapentin  100 mg 3 times a day. This is most likely related to diabetic polyneuropathy. Okay  to trial gabapentin  200 mg 3 times a day Orders:   gabapentin  (NEURONTIN ) 100 MG capsule; Take 2 capsules (200 mg total) by mouth 3 (three) times daily.  Diabetes mellitus type 2, insulin  dependent (HCC) CBG 169 during recent hospitalization. He continues on his Lantus  and Humalog  dose as previously prescribed. Denies polys. Encouraged to adhere to a low carbohydrate diet and exercise daily and track CBGs Okay for refill of lancets Orders:   Accu-Chek Softclix Lancets lancets; Use to  check blood sugar 3 times daily.Must have office visit for refills  Hypotension due to drugs He reports that he was started on carvedilol  during recent hospitalization which he did not tolerate due to hypotension and dizziness. He stopped this medication due to side effects. He is encouraged to stay hydrated and will continue to track his blood pressures. Encouraged to discuss this with his PCP during his next clinic visit         Patient was given the opportunity to ask questions.  Patient verbalized understanding of the plan and was able to repeat key elements of the plan.   This documentation was completed using Paediatric nurse.  Any transcriptional errors are unintentional.     Requested Prescriptions   Signed Prescriptions Disp Refills   Accu-Chek Softclix Lancets lancets 100 each 0    Sig: Use to check blood sugar 3 times daily.Must have office visit for refills   atorvastatin  (LIPITOR) 40 MG tablet 90 tablet 1    Sig: Take 1 tablet (40 mg total) by mouth daily.   gabapentin  (NEURONTIN ) 100 MG capsule 180 capsule 3    Sig: Take 2 capsules (200 mg total) by mouth 3 (three) times daily.    Return in about 3 months (around 09/19/2024) for with PCP.  Dorrene Bently H, NP     [1]  Current Outpatient Medications on File Prior to Visit  Medication Sig Dispense Refill   acetaminophen  (TYLENOL ) 500 MG tablet Take 1 tablet (500 mg total) by mouth every 8 (eight) hours as needed for mild pain (pain score 1-3) or headache. 20 tablet 0   aspirin  EC 81 MG tablet Take 1 tablet (81 mg total) by mouth daily. 120 tablet 2   carvedilol  (COREG ) 3.125 MG tablet Take 1 tablet (3.125 mg total) by mouth 2 (two) times daily. 60 tablet 11   Continuous Glucose Sensor (DEXCOM G7 SENSOR) MISC Check blood glucose levels continuously  E11.65 z79.84 3 each 6   doxycycline  (VIBRAMYCIN ) 100 MG capsule Take 1 capsule (100 mg total) by mouth 2 (two) times daily. One po bid x 7 days 14  capsule 0   fenofibrate  (TRICOR ) 145 MG tablet Take 1 tablet (145 mg total) by mouth daily. 90 tablet 1   glucose blood (ACCU-CHEK GUIDE TEST) test strip Use to check blood sugar 3 times daily. 100 each 3   insulin  glargine (LANTUS ) 100 UNIT/ML injection Inject 0.25 mLs (25 Units total) into the skin at bedtime. (Patient taking differently: Inject 50 Units into the skin at bedtime.)     insulin  lispro (HUMALOG ) 100 UNIT/ML KwikPen Inject 40 Units into the skin 2 (two) times daily. 54 mL 1   Insulin  Pen Needle (TRUEPLUS PEN NEEDLES) 31G X 5 MM MISC Use as directed to administer insulin . 100 each 5   pantoprazole  (PROTONIX ) 40 MG tablet Take 1 tablet (40 mg total) by mouth daily. 30 tablet 1   Vitamin D , Ergocalciferol , (DRISDOL ) 1.25 MG (50000 UNIT) CAPS capsule Take 1 capsule (50,000 Units  total) by mouth every 7 (seven) days. 12 capsule 0   azithromycin  (ZITHROMAX ) 500 MG tablet Take 1 tablet (500 mg total) by mouth daily. (Patient not taking: Reported on 06/21/2024) 5 tablet 0   nicotine  (NICODERM CQ  - DOSED IN MG/24 HOURS) 21 mg/24hr patch Place 1 patch (21 mg total) onto the skin daily. (Patient not taking: Reported on 06/21/2024) 28 patch 0   No current facility-administered medications on file prior to visit.  [2] No Known Allergies  "

## 2024-06-22 ENCOUNTER — Other Ambulatory Visit: Payer: Self-pay

## 2024-06-26 ENCOUNTER — Telehealth: Payer: Self-pay | Admitting: Nurse Practitioner

## 2024-06-26 NOTE — Telephone Encounter (Signed)
 Please fax Chest CT results from 05-15-2018. Thank you

## 2024-06-26 NOTE — Telephone Encounter (Signed)
 Copied from CRM [id number]. Topic: General - Billing Inquiry >> Jun 26, 2024 11:51 AM   Kevelyn M wrote:  Reason for CRM: Kaylah with [facility] calling to let us  know that the patient's insurance denied CT of chest. They are requesting additional information. They want to know why he needs this scan. We can either fax to same office or a peer to peer review can bee arranged.

## 2024-06-26 NOTE — Telephone Encounter (Signed)
 Results faxed 06/26/24.

## 2024-07-03 ENCOUNTER — Other Ambulatory Visit: Payer: Self-pay

## 2024-07-03 ENCOUNTER — Telehealth: Payer: Self-pay | Admitting: Nurse Practitioner

## 2024-07-03 NOTE — Telephone Encounter (Signed)
 Form faxed

## 2024-07-03 NOTE — Telephone Encounter (Addendum)
 No fax received as of yet, sending as an FYI.

## 2024-07-03 NOTE — Telephone Encounter (Signed)
 Copied from CRM [id number]. Topic: General - Call Back - No Documentation >> Jul 03, 2024 11:25 AM   Olam RAMAN wrote:  Reason for CRM: [phone] [place] for medical clearence [facility] was faxed on 1/14 Pt has surgery 1/30 and needs ot hear something back before then

## 2024-07-04 ENCOUNTER — Other Ambulatory Visit: Payer: Self-pay

## 2024-07-04 LAB — HEMOGLOBIN A1C: Hemoglobin A1C: 10.2

## 2024-07-10 ENCOUNTER — Ambulatory Visit: Admitting: Nurse Practitioner

## 2024-07-19 ENCOUNTER — Encounter: Payer: Self-pay | Admitting: Gastroenterology

## 2024-07-25 ENCOUNTER — Ambulatory Visit: Admitting: Nurse Practitioner

## 2024-09-19 ENCOUNTER — Ambulatory Visit: Payer: Self-pay | Admitting: Nurse Practitioner
